# Patient Record
Sex: Male | Born: 1945 | Race: White | Hispanic: No | Marital: Married | Smoking: Never smoker
Health system: Southern US, Community
[De-identification: ages and names within clinical notes are randomized; demographics above are authoritative.]

## PROBLEM LIST (undated history)

## (undated) DIAGNOSIS — R51 Headache: Secondary | ICD-10-CM

## (undated) DIAGNOSIS — J189 Pneumonia, unspecified organism: Secondary | ICD-10-CM

## (undated) DIAGNOSIS — F419 Anxiety disorder, unspecified: Secondary | ICD-10-CM

## (undated) DIAGNOSIS — C61 Malignant neoplasm of prostate: Secondary | ICD-10-CM

## (undated) DIAGNOSIS — E785 Hyperlipidemia, unspecified: Secondary | ICD-10-CM

## (undated) DIAGNOSIS — E119 Type 2 diabetes mellitus without complications: Secondary | ICD-10-CM

## (undated) DIAGNOSIS — E11610 Type 2 diabetes mellitus with diabetic neuropathic arthropathy: Secondary | ICD-10-CM

## (undated) DIAGNOSIS — Z9289 Personal history of other medical treatment: Secondary | ICD-10-CM

## (undated) DIAGNOSIS — N400 Enlarged prostate without lower urinary tract symptoms: Secondary | ICD-10-CM

## (undated) DIAGNOSIS — G629 Polyneuropathy, unspecified: Secondary | ICD-10-CM

## (undated) DIAGNOSIS — I10 Essential (primary) hypertension: Secondary | ICD-10-CM

## (undated) HISTORY — DX: Benign prostatic hyperplasia without lower urinary tract symptoms: N40.0

## (undated) HISTORY — PX: TONSILLECTOMY: SUR1361

## (undated) HISTORY — DX: Type 2 diabetes mellitus with diabetic neuropathic arthropathy: E11.610

## (undated) HISTORY — DX: Malignant neoplasm of prostate: C61

## (undated) HISTORY — DX: Type 2 diabetes mellitus without complications: E11.9

## (undated) HISTORY — DX: Personal history of other medical treatment: Z92.89

## (undated) HISTORY — DX: Essential (primary) hypertension: I10

## (undated) HISTORY — PX: EYE SURGERY: SHX253

## (undated) HISTORY — PX: ESOPHAGOGASTRODUODENOSCOPY: SHX1529

## (undated) HISTORY — PX: OTHER SURGICAL HISTORY: SHX169

---

## 1997-05-07 DIAGNOSIS — Z9289 Personal history of other medical treatment: Secondary | ICD-10-CM

## 1997-05-07 HISTORY — DX: Personal history of other medical treatment: Z92.89

## 1997-11-29 ENCOUNTER — Ambulatory Visit (HOSPITAL_COMMUNITY): Admission: RE | Admit: 1997-11-29 | Discharge: 1997-11-29 | Payer: Self-pay | Admitting: Family Medicine

## 1998-01-12 ENCOUNTER — Ambulatory Visit (HOSPITAL_COMMUNITY): Admission: RE | Admit: 1998-01-12 | Discharge: 1998-01-12 | Payer: Self-pay | Admitting: Family Medicine

## 1998-01-12 ENCOUNTER — Encounter: Payer: Self-pay | Admitting: Family Medicine

## 1998-05-07 HISTORY — PX: OTHER SURGICAL HISTORY: SHX169

## 1999-05-03 ENCOUNTER — Other Ambulatory Visit: Admission: RE | Admit: 1999-05-03 | Discharge: 1999-05-03 | Payer: Self-pay | Admitting: Urology

## 1999-05-18 ENCOUNTER — Encounter: Admission: RE | Admit: 1999-05-18 | Discharge: 1999-08-16 | Payer: Self-pay | Admitting: Radiation Oncology

## 1999-07-11 ENCOUNTER — Encounter: Payer: Self-pay | Admitting: Urology

## 1999-07-11 ENCOUNTER — Ambulatory Visit (HOSPITAL_BASED_OUTPATIENT_CLINIC_OR_DEPARTMENT_OTHER): Admission: RE | Admit: 1999-07-11 | Discharge: 1999-07-11 | Payer: Self-pay | Admitting: Urology

## 1999-08-23 ENCOUNTER — Encounter: Admission: RE | Admit: 1999-08-23 | Discharge: 1999-11-21 | Payer: Self-pay | Admitting: Radiation Oncology

## 1999-11-15 ENCOUNTER — Encounter: Admission: RE | Admit: 1999-11-15 | Discharge: 2000-02-13 | Payer: Self-pay | Admitting: *Deleted

## 2000-02-20 ENCOUNTER — Encounter: Admission: RE | Admit: 2000-02-20 | Discharge: 2000-05-20 | Payer: Self-pay | Admitting: Orthopedic Surgery

## 2000-02-29 ENCOUNTER — Ambulatory Visit (HOSPITAL_BASED_OUTPATIENT_CLINIC_OR_DEPARTMENT_OTHER): Admission: RE | Admit: 2000-02-29 | Discharge: 2000-02-29 | Payer: Self-pay | Admitting: Orthopedic Surgery

## 2000-09-04 DIAGNOSIS — E11610 Type 2 diabetes mellitus with diabetic neuropathic arthropathy: Secondary | ICD-10-CM

## 2000-09-04 HISTORY — PX: FOOT SURGERY: SHX648

## 2000-09-04 HISTORY — DX: Type 2 diabetes mellitus with diabetic neuropathic arthropathy: E11.610

## 2001-07-31 ENCOUNTER — Inpatient Hospital Stay (HOSPITAL_COMMUNITY): Admission: AD | Admit: 2001-07-31 | Discharge: 2001-08-04 | Payer: Self-pay | Admitting: Orthopedic Surgery

## 2001-07-31 ENCOUNTER — Encounter: Payer: Self-pay | Admitting: Orthopedic Surgery

## 2001-12-15 ENCOUNTER — Inpatient Hospital Stay (HOSPITAL_COMMUNITY): Admission: AD | Admit: 2001-12-15 | Discharge: 2001-12-20 | Payer: Self-pay | Admitting: Orthopedic Surgery

## 2001-12-15 HISTORY — PX: OTHER SURGICAL HISTORY: SHX169

## 2001-12-22 ENCOUNTER — Encounter (HOSPITAL_BASED_OUTPATIENT_CLINIC_OR_DEPARTMENT_OTHER): Admission: RE | Admit: 2001-12-22 | Discharge: 2002-03-22 | Payer: Self-pay | Admitting: Orthopedic Surgery

## 2002-03-23 ENCOUNTER — Encounter (HOSPITAL_BASED_OUTPATIENT_CLINIC_OR_DEPARTMENT_OTHER): Admission: RE | Admit: 2002-03-23 | Discharge: 2002-06-21 | Payer: Self-pay | Admitting: Orthopedic Surgery

## 2002-12-06 HISTORY — PX: OTHER SURGICAL HISTORY: SHX169

## 2010-09-06 ENCOUNTER — Encounter: Payer: Self-pay | Admitting: Family Medicine

## 2010-09-22 NOTE — Op Note (Signed)
NAME:  Logan May, Logan May                       ACCOUNT NO.:  [id number]   MEDICAL RECORD NO.:  [id number]                   PATIENT TYPE:  INP   LOCATION:  5014                                 FACILITY:  [facility]   PHYSICIAN:  Nadara Mustard, M.D.                DATE OF BIRTH:  [birth year]   DATE OF PROCEDURE:  12/16/2001  DATE OF DISCHARGE:                                 OPERATIVE REPORT   PREOPERATIVE DIAGNOSES:  1. 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  2. New purulent abscess, lateral left foot with ischemic necrotic foot skin     laterally.   PROCEDURE:  1. Irrigation and debridement of plantar chronic Wagner grade 1 ulcer with     exostectomy and loose closure of the plantar wound.  2. Irrigation and debridement as well as exostectomy of the lateral purulent     abscess.  Cultures obtained x4.   SURGEON:  Nadara Mustard, M.D.   ANESTHESIA:  General.   ESTIMATED BLOOD LOSS:  Minimal.   TOURNIQUET TIME:  Esmarch to the ankle for approximately 15 minutes.   ANTIBIOTICS:  Zosyn preoperatively.  Cultures x2.   DISPOSITION:  PACU in stable condition.   INDICATIONS FOR PROCEDURE:  The patient is a 65 year old gentleman with a  chronic plantar Charcot ulcer who has had a stable course with this ulcer;  however, the patient has over the weekend developed a new lateral ischemic  ulcer and has had purulence, fever, chills and presents at this time for  surgical intervention.  The risks and benefits were discussed.  Patient  states he understands and wishes to proceed at this time.   DESCRIPTION OF PROCEDURE:  The patient was brought to the OR 15 and  underwent a general anesthetic.  After an adequate level of anesthesia was  obtained, the patient's left lower extremity was prepped using Duraprep and  draped as a sterile field.  Attention was first focused on the cleaner  plantar ulcer.  The ulcer was ellipsed out.  The exostosis was excised.  The  wound was irrigated with pulse lavage.  The wound had good viable skin edges  and there was no purulent drainage from the wound.  Attention was then  focused on the lateral wound.  The lateral abscess was incised.  Purulent  discharge was identified.  Cultures were obtained x4.  An exostectomy at the  base of the fifth metatarsal was also performed.  Necrotic tissue was  excised.  This wound was left open.  Again, this wound was also irrigated  with pulse lavage.  The plantar wound was covered with Adaptic.  The lateral  wound was covered with a moistened 4x4.  The wounds were then covered with  4x4s, Webril and a loosely wrapped Coban.  The patient  was extubated and taken to the PACU in stable condition.  Total tourniquet  time around  the ankle was Esmarch for approximately 15 minutes.  Plan for  repeat daily pulse lavage of the lateral wound.  Patient may eventually  require vac for wound closure.  Plan for discharge once stable from a wound  stand point.                                               Nadara Mustard, M.D.    MVD/MEDQ  D:  12/16/2001  T:  12/18/2001  Job:  [id number]

## 2010-09-22 NOTE — Discharge Summary (Signed)
[place]  Patient:    Logan May, Logan May Visit Number: [id number] MRN: [id number]          Service Type: MED Location: 5000 5013 01 Attending Physician:  Nadara Mustard Dictated by:   Nadara Mustard, M.D. Admit Date:  07/31/2001 Discharge Date: 08/04/2001                             Discharge Summary  DIAGNOSES: 1. Cellulitis with Charcot arthropathy and collapse of the left foot 2. Type II diabetes.  PROCEDURES: IV antibiotics and wound care.  DISCHARGE DISPOSITION: To home in stable condition.  FOLLOW-UP: In the office in one week.  HISTORY OF PRESENT ILLNESS: The patient is a 65 year old gentleman with type II diabetes with neuropathy, who has had an ulceration on the plantar aspect of his left foot secondary to Charcot collapse of the left foot. The patient presented to the office with acute ischemia of the right third toe and cellulitis of the mid foot. The patient was felt to require IV antibiotics and was admitted for IV antibiotics and wound care. The patients hospital course was essentially unremarkable. He was started on IV Zosyn and was started on wound care with Accuzyme dressing changes and debridement of the ulcer. The ulcer and cellulitis resolved. The patient still had persistent ulceration but the cellulitis had completely resolved. The patient was discharged to home in stable condition on p.o. Keflex 500 mg twice a day on August 03, 2001 in stable condition. He will resume using his Iodosorb with dressing changes at home daily after cleansing the wound. Will be nonweight bearing on the foot and follow-up in the office in one week. Dictated by:   Nadara Mustard, M.D. Attending Physician:  Nadara Mustard DD:  08/19/01 TD:  08/19/01 Job: [id number]

## 2010-09-22 NOTE — Discharge Summary (Signed)
   NAME:  Logan May, Logan May                       ACCOUNT NO.:  [id number]   MEDICAL RECORD NO.:  [id number]                   PATIENT TYPE:  INP   LOCATION:  5014                                 FACILITY:  [facility]   PHYSICIAN:  Nadara Mustard, M.D.                DATE OF BIRTH:  11-17-1945   DATE OF ADMISSION:  12/15/2001  DATE OF DISCHARGE:  12/20/2001                                 DISCHARGE SUMMARY   DIAGNOSIS:  Abscess, left foot.   PROCEDURE:  1. Exostectomy for prominent Charcot collapsed exostosis.  2. Irrigation and debridement of open ulcers x2.   DISPOSITION:  Discharged to home in stable condition.   FOLLOWUP:  Follow up in the office in one week.   HISTORY OF PRESENT ILLNESS:  The patient is a 65 year old gentleman, type 2  diabetic, with a chronic history of ulcer of his left foot secondary to  Charcot collapse.  The patient recently has had an acute new laceration over  the lateral aspect of his foot and a recurrent laceration over the plantar  aspect of his foot and presents at this time for treatment of the new and  chronic ulcer.   HOSPITAL COURSE:  The patient underwent surgical treatment on December 16, 2001 with exostectomy, irrigation and debridement of the ulcers x2.  His  tourniquet time was at the ankle for approximately 15 minutes.  He received  Zosyn preoperatively and cultures were obtained x2.  The patient's  postoperative course was essentially unremarkable.  He remained on Zosyn.  His temperature and CBG remained within normal ranges.  He was started on  hydrotherapy on postoperative day 1.  The patient progressed well.  The  wound care showed good healing.  He was discharged to home in stable  condition on December 20, 2001 with a prescription for p.o. antibiotics and  followup in the office next week.   WOUND CARE:  He was given instructions for wound care.                                               Nadara Mustard, M.D.    MVD/MEDQ  D:   01/13/2002  T:  01/14/2002  Job:  [id number]

## 2010-09-22 NOTE — Op Note (Signed)
[place]  Patient:    Logan May, Logan May                    MRN: [id number] Proc. Date: 02/29/00 Adm. Date:  [id number] Disc. Date: [id number] Attending:  Nadara Mustard                           Operative Report  PREOPERATIVE DIAGNOSIS: [address] grade 1 ulcer with exostosis, left mid foot.  POSTOPERATIVE DIAGNOSIS: Wagner grade 1 ulcer with exostosis, left mid foot.  OPERATION/PROCEDURE: Exostectomy, left mid foot.  SURGEON: Nadara Mustard, M.D.  ANESTHESIA: Ankle block.  ESTIMATED BLOOD LOSS: Minimal.  ANTIBIOTICS: Kefzol 1 g.  DRAINS: None.  COMPLICATIONS: None.  TOURNIQUET TIME: Esmarch at ankle for approximately 15 minutes.  DISPOSITION: Patient taken to the PACU in stable condition.  INDICATIONS FOR PROCEDURE: The patient is a 66 year old gentleman who has had a chronic mid foot plantar ulcer of his left mid foot and has undergone conservative care with casting and has finally healed the ulcer and presents at this time for exostectomy.  The risks and benefits were discussed including infection, neurovascular injury, persistent ulceration and need for additional surgery.  The patient states he understands and wishes to proceed at this time.  DESCRIPTION OF PROCEDURE: The patient was brought to operating room 6 outpatient after undergoing an ankle block.  After an adequate level of anesthesia was obtained the patients left lower extremity was prepped using DuraPrep and draped into a sterile field.  A longitudinal incision was made on the plantar aspect of the foot and carried down to the exostosis.  The tendons and vessels were protected and were moved out of the surgical field.  The exostosis was excised using an osteotome.  The final plantar aspect of his foot was level, with no bony prominence.  The wound was irrigated with normal saline and there was no evidence of infection.  The wound was closed using a vertical mattress  suture of 2-0 Nylon.  The wound was covered with Adaptic, orthopedic sponges, sterile Webril, and a Coban.  The patient was then taken to the PACU in stable condition, with plan to follow up in the office in one week, nonweightbearing on the left lower extremity. DD:  02/29/00 TD:  03/01/00 Job: [id number]

## 2010-09-22 NOTE — Op Note (Signed)
[place]  Patient:    Logan May, Logan May                MRN: [id number] Proc. Date: 07/11/99 Adm. Date:  [id number] Attending:  Evlyn Clines CC:         Monica Becton, M.D.             Wynn Banker, M.D.                           Operative Report  PREOPERATIVE DIAGNOSIS:  Prostate cancer.  POSTOPERATIVE DIAGNOSIS:  Prostate cancer.  PROCEDURE:  Radioactive I-125 prostate seed implantation and cystoscopy with foreign body removal.  SURGEON:  Excell Seltzer. Annabell Howells, M.D.  ANESTHESIA:  General.  DRAINS:  Foley.  COMPLICATIONS:  None.  INDICATIONS:  The patient is a 65 year old white male with prostate cancer.  He has elected seed implantation.  FINDINGS AND DESCRIPTION OF PROCEDURE:  The patient was taken to the operating oom where he received 400 mg of Cipro IV.  A general anesthetic was induced and he as placed in the lithotomy position.  His genitalia was prepped and Foley catheter was inserted.  The balloon was filled with 10 cc of _____ contrast.  The scrotum was then retracted cephalad with towel and Op-Site dressing.  The catheter had been  placed to straight drainage.  The perineum was shaved.  The red rubber catheter was placed rectally ________.  The ultrasound probe was assembled and inserted after being obscured in the stabilization base.  Once in position, the ultrasound probe was adjusted until it approximated the preoperative treatment plan.  At that point, it was secured.  The patients perineum was then prepped with Betadine solution.  He was draped with sterile towels.  The needle guide template was placed. Anchors were then placed under ultrasonic and fluoroscopic guidance at C3.5 and E3.5. nce the anchors were in position, the treatment was performed according to the predetermined plan.  There were some minor adjustments made in the periurethral  area where two needles were moved laterally,  0.5 cm on each side.  A total of 140 seeds were placed using 28 needle.  The position of the seeds were checked frequently with fluoroscopy and the ultrasound.  Once all seeds had been placed, films were taken with the probe in and out.  With the probe removed, the Foley catheter was removed.  The genitalia was reprepped and cystoscopy was performed with a 16-French flexible scope.  Examination revealed a normal urethra and intact external sphincter.  The prostatic urethra had some lateral lobe enlargement without significant obstruction.  No evidence of injury to the urethra or prostatic urethra were identified.  The bladder had mild trabeculation. There was no bleeding.  Ureteral orifices are in their normal anatomic position. There was one strand of seeds that were protruding approximately 2-3 cm into the bladder at the left bladder neck.  It was felt these needed to be removed.  The seed strand was grasped with a grasping forceps and removed without difficulty. A fresh Foley catheter was then inserted and balloon was filled with 10 cc of sterile fluid, and the catheter was placed to straight drainage.  The patients perineum was cleaned and dressed.  The patient was taken down from the lithotomy position, his anesthetic and reversed, and he was moved to the recovery room in stable condition.  There were no complications. DD:  07/11/99  TD:  07/12/99 Job: [id number]

## 2010-09-22 NOTE — H&P (Signed)
[facility]  Patient:    Logan May, Logan May                    MRN: [id number] Adm. Date:  [id number] Attending:  Kandis Mannan                         History and Physical  CHIEF COMPLAINT:  Blister of left foot.  HISTORY OF PRESENT ILLNESS:  This 65 year old white male noted a blister on his  left foot about two days ago.  This was on the plantar surface, and he was seen by Dr. Estell Harpin, and started on Cipro 750 mg b.i.d.  The blister was covered with Duoderm.  The patient has now developed some increased temperature with redness in his left foot.  The patient had x-rays of his foot today, which have not been interpreted, but are consistent as far as I can tell with Charcot-like changes.  PAST MEDICAL HISTORY:  The patient is a non-insulin-dependent diabetic.  He has had cancer of the prostate.  CURRENT MEDICATIONS: 1. Accupril 40 mg q.d. 2. Ultram 50 mg q. night. 3. ______ 2 mg q.d. 4. Cipro 750 mg b.i.d. 5. ______/HCT 160 mg q.d.  PHYSICAL EXAMINATION:  EXTREMITIES:  Reveals a deformed left foot with extra bony prominences of the plantar surface of the left foot.   Temperature of the plantar surface on the left side is 93.6 degrees, and on the right side is 85.9 degrees.  A blister was removed, and before the blister was removed, it was 55.0 mm x 50.0 mm, and after debriding the wound, he had an ulcer area 27.0 mm x 28.0 mm.  IMPRESSION:  Charcots joint, left foot.  DISPOSITION:  The patient was debrided today, and he will continue his Cipro. e will be placed in a Mabel cast and will be seen back in the clinic in six days, for a followup. DD:  11/15/99 TD:  11/15/99 Job: [id number]

## 2010-09-22 NOTE — H&P (Signed)
NAME:  Logan May, WALSH                       ACCOUNT NO.:  [id number]   MEDICAL RECORD NO.:  [id number]                   PATIENT TYPE:  INP   LOCATION:  5014                                 FACILITY:  [facility]   PHYSICIAN:  Nadara Mustard, M.D.                DATE OF BIRTH:  1946-02-21   DATE OF ADMISSION:  12/15/2001  DATE OF DISCHARGE:                                HISTORY & PHYSICAL   HISTORY OF PRESENT ILLNESS:  The patient is a 65 year old gentleman, type 2  diabetic, with a chronic plantar ulcer, left mid  foot, secondary to Charcot  collapse. The patient has been treated conservatively with casting as well  as exostectomy for this chronic ulcer and has episodes where the ulcer has  healed and then recurred. The patient presents at this time with a new  lateral ulcer over the base of the  fifth metatarsal which he states  happened over the weekend. He complains of fever, chills, drainage from the  lateral wound.   ALLERGIES:  No known drug allergies.   MEDICATIONS:  1. Accupril.  2. Valium.  3. Keflex.  4. Diovan.  5. Amaryl.   PAST SURGICAL HISTORY:  1. Seed implant 2001.  2. Ostectomy of  left foot 2001.   FAMILY HISTORY:  Positive for diabetes and hypertension.   SOCIAL HISTORY:  Negative for tobacco, positive for alcohol. He is married.   REVIEW OF SYMPTOMS:  Positive for prostate cancer, type 2 diabetes,  hypertension and does have a history of mononucleosis.   PHYSICAL EXAMINATION:  VITAL SIGNS:  Temperature 100.5, pulse 78,  respiratory rate 16, blood pressure 132/78.  GENERAL:  No acute distress.  LUNGS:  Clear to auscultation.  CARDIOVASCULAR:  Regular rate and rhythm.  NECK:  Supple, no bruits.  EXTREMITIES:  Examination of the left foot, he has a swollen, erythematous  foot with the erythema laterally over the necrotic lateral ulcer over the  base of the  fifth metatarsal. He also has some drainage  coming from his  chronic plantar ulcer, although  there is no cellulitis  or necrosis around  the chronic plantar ulcer.   ASSESSMENT:  1. New abscess, lateral  left foot.  2. Chronic  Charcot collapse with plantar Wagner grade 1 ulcer.   PLAN:  We will go ahead and admit him and place  him on IV antibiotics and  plan for surgery in the morning. Plan for irrigation and debridement and  exostectomy as well as debridement of purulent abscess. The risks and  benefits were discussed, including infection, neurovascular injury,  persistent infection, need for additional surgery, need for high level  amputation. The patient states he understands and wishes to proceed at this  time.  Nadara Mustard, M.D.    MVD/MEDQ  D:  12/16/2001  T:  12/19/2001  Job:  [id number]

## 2010-09-22 NOTE — H&P (Signed)
[place]. [facility]  Patient:    Logan May, Logan May Visit Number: [id number] MRN: [id number]          Service Type: MED Location: 5000 5013 01 Attending Physician:  Nadara Mustard Dictated by:   Nadara Mustard, M.D. Admit Date:  07/31/2001                           History and Physical  HISTORY OF PRESENT ILLNESS:  Patient is a 65 year old gentleman with type 2 diabetes who has been followed for ulceration with a Charcot collapse of his left midfoot, who presents at this time with an acute onset of ischemia of the right third toe and cellulitis of the midfoot.  PAST SURGICAL HISTORY:  Seed implant for prostate CA.  ALLERGIES:  No known drug allergies.  PRIMARY CARE PHYSICIAN:  Dr. Monica Becton in [place], [place].  SOCIAL HISTORY:  Negative for tobacco.  He works in [employer].  REVIEW OF SYSTEMS:  Positive for prostate CA, type 2 diabetes and hypertension.  PHYSICAL EXAMINATION:  VITAL SIGNS:  Temperature 98.8, heart rate 88, respiratory rate 16, blood pressure 128/84.  GENERAL:  He is in no acute distress.  LUNGS:  Clear to auscultation.  CARDIOVASCULAR:  Regular rate and rhythm.  NECK:  Supple with no bruits.  EXTREMITIES:  On examination of the right lower extremity, he has ischemic and necrotic right third toe with cellulitis to the midfoot.  He has good dorsalis pedis pulse.  Left foot:  He has a Charcot collapse with recurrent ulceration.  LABORATORY AND ACCESSORY DATA:  Radiographs of the right foot show no osteomyelitis of the right third toe.   ASSESSMENT: 1. Cellulitis and ischemic changes to the right third toe. 2. Charcot collapse of left foot with ulceration of midfoot ulcer.  PLAN:  We will admit the patient, place him on IV Zosyn, start wound care with Accuzyme dressing changes daily, apply a felt donut to the left midfoot.  Plan to discharge once the cellulitis has resolved; discussed the possibility of  a third toe amputation if this does not resolve. Dictated by:   Nadara Mustard, M.D. Attending Physician:  Nadara Mustard DD:  08/01/01 TD:  08/01/01 Job: [id number]

## 2011-11-16 ENCOUNTER — Institutional Professional Consult (permissible substitution): Payer: Self-pay | Admitting: Cardiology

## 2011-11-30 ENCOUNTER — Encounter: Payer: Self-pay | Admitting: Cardiology

## 2012-09-03 ENCOUNTER — Encounter (HOSPITAL_COMMUNITY): Payer: Self-pay | Admitting: Pharmacy Technician

## 2012-09-03 ENCOUNTER — Other Ambulatory Visit (HOSPITAL_COMMUNITY): Payer: Self-pay | Admitting: Orthopedic Surgery

## 2012-09-04 ENCOUNTER — Encounter (HOSPITAL_COMMUNITY): Payer: Self-pay

## 2012-09-04 ENCOUNTER — Ambulatory Visit (HOSPITAL_COMMUNITY)
Admission: RE | Admit: 2012-09-04 | Discharge: 2012-09-04 | Disposition: A | Discharge status: Home / Self Care | Payer: Medicare Other | Source: Ambulatory Visit | Attending: Orthopedic Surgery | Admitting: Orthopedic Surgery

## 2012-09-04 ENCOUNTER — Encounter (HOSPITAL_COMMUNITY)
Admission: RE | Admit: 2012-09-04 | Discharge: 2012-09-04 | Disposition: A | Discharge status: Home / Self Care | Payer: Medicare Other | Source: Ambulatory Visit | Attending: Orthopedic Surgery | Admitting: Orthopedic Surgery

## 2012-09-04 HISTORY — DX: Anxiety disorder, unspecified: F41.9

## 2012-09-04 LAB — PROTIME-INR
INR: 1.04 (ref 0.00–1.49)
Prothrombin Time: 13.5 seconds (ref 11.6–15.2)

## 2012-09-04 LAB — COMPREHENSIVE METABOLIC PANEL
Albumin: 3.8 g/dL (ref 3.5–5.2)
BUN: 13 mg/dL (ref 6–23)
Creatinine, Ser: 0.98 mg/dL (ref 0.50–1.35)
GFR calc Af Amer: >90 mL/min (ref >90)
Glucose, Bld: 170 mg/dL — ABNORMAL HIGH (ref 70–99)
Total Protein: 7.8 g/dL (ref 6.0–8.3)

## 2012-09-04 LAB — CBC
HCT: 41.9 % (ref 39.0–52.0)
Hemoglobin: 14.5 g/dL (ref 13.0–17.0)
MCH: 31 pg (ref 26.0–34.0)
MCHC: 34.6 g/dL (ref 30.0–36.0)
MCV: 89.5 fL (ref 78.0–100.0)
RDW: 13.1 % (ref 11.5–15.5)

## 2012-09-04 MED ORDER — CEFAZOLIN SODIUM-DEXTROSE 2-3 GM-% IV SOLR
2.0000 g | INTRAVENOUS | Status: AC
  Administered 2012-09-05: 2 g via INTRAVENOUS
  Filled 2012-09-04: qty 50

## 2012-09-04 NOTE — Progress Notes (Signed)
PCP is Dr. Christell Constant at [facility]. Patient denied having a stress test, cardiac cath, or sleep study.

## 2012-09-04 NOTE — Pre-Procedure Instructions (Signed)
[facility]  09/04/2012   Your procedure is scheduled on:  Friday Sep 05, 2012.  Report to [facility] 3[facility] at 12:50 PM.  Call this number if you have problems the morning of surgery: [phone]   Remember:   Do not eat food or drink liquids after midnight.   Take these medicines the morning of surgery with A SIP OF WATER: Diazepam (Valium) if needed for anxiety, Doxycycline (Doryx), Hydrocodone if needed for pain, and Nifedipine (Procardia XL)   Do not wear jewelry  Do not wear lotions or colognes.  Men may shave face and neck.  Do not bring valuables to the hospital.  Contacts, dentures or bridgework may not be worn into surgery.  Leave suitcase in the car. After surgery it may be brought to your room.  For patients admitted to the hospital, checkout time is 11:00 AM the day of discharge.   Patients discharged the day of surgery will not be allowed to drive home.  Name and phone number of your driver: Family/Friend  Special Instructions: Shower using CHG 2 nights before surgery and the night before surgery.  If you shower the day of surgery use CHG.  Use special wash - you have one bottle of CHG for all showers.  You should use approximately 1/3 of the bottle for each shower.   Please read over the following fact sheets that you were given: Pain Booklet, Coughing and Deep Breathing, MRSA Information and Surgical Site Infection Prevention

## 2012-09-05 ENCOUNTER — Encounter (HOSPITAL_COMMUNITY): Payer: Self-pay | Admitting: *Deleted

## 2012-09-05 ENCOUNTER — Ambulatory Visit (HOSPITAL_COMMUNITY): Payer: Medicare Other | Admitting: Vascular Surgery

## 2012-09-05 ENCOUNTER — Encounter (HOSPITAL_COMMUNITY)
Admission: RE | Disposition: A | Discharge status: Home / Self Care | Payer: Self-pay | Source: Ambulatory Visit | Attending: Orthopedic Surgery

## 2012-09-05 ENCOUNTER — Encounter (HOSPITAL_COMMUNITY): Payer: Self-pay | Admitting: Vascular Surgery

## 2012-09-05 ENCOUNTER — Ambulatory Visit (HOSPITAL_COMMUNITY)
Admission: RE | Admit: 2012-09-05 | Discharge: 2012-09-05 | Disposition: A | Discharge status: Home / Self Care | Payer: Medicare Other | Source: Ambulatory Visit | Attending: Orthopedic Surgery | Admitting: Orthopedic Surgery

## 2012-09-05 DIAGNOSIS — E1142 Type 2 diabetes mellitus with diabetic polyneuropathy: Secondary | ICD-10-CM | POA: Insufficient documentation

## 2012-09-05 DIAGNOSIS — L97509 Non-pressure chronic ulcer of other part of unspecified foot with unspecified severity: Secondary | ICD-10-CM | POA: Insufficient documentation

## 2012-09-05 DIAGNOSIS — M869 Osteomyelitis, unspecified: Secondary | ICD-10-CM | POA: Insufficient documentation

## 2012-09-05 DIAGNOSIS — E1169 Type 2 diabetes mellitus with other specified complication: Secondary | ICD-10-CM | POA: Insufficient documentation

## 2012-09-05 DIAGNOSIS — M9080 Osteopathy in diseases classified elsewhere, unspecified site: Secondary | ICD-10-CM | POA: Insufficient documentation

## 2012-09-05 DIAGNOSIS — M86172 Other acute osteomyelitis, left ankle and foot: Secondary | ICD-10-CM

## 2012-09-05 DIAGNOSIS — F411 Generalized anxiety disorder: Secondary | ICD-10-CM | POA: Insufficient documentation

## 2012-09-05 DIAGNOSIS — C61 Malignant neoplasm of prostate: Secondary | ICD-10-CM | POA: Insufficient documentation

## 2012-09-05 DIAGNOSIS — I10 Essential (primary) hypertension: Secondary | ICD-10-CM | POA: Insufficient documentation

## 2012-09-05 DIAGNOSIS — E559 Vitamin D deficiency, unspecified: Secondary | ICD-10-CM | POA: Insufficient documentation

## 2012-09-05 DIAGNOSIS — E1149 Type 2 diabetes mellitus with other diabetic neurological complication: Secondary | ICD-10-CM | POA: Insufficient documentation

## 2012-09-05 HISTORY — PX: I & D EXTREMITY: SHX5045

## 2012-09-05 SURGERY — IRRIGATION AND DEBRIDEMENT EXTREMITY
Anesthesia: Regional | Site: Foot | Laterality: Left

## 2012-09-05 MED ORDER — OXYCODONE-ACETAMINOPHEN 5-325 MG PO TABS: 1.0000 | ORAL_TABLET | ORAL | Status: DC | PRN

## 2012-09-05 MED ORDER — VANCOMYCIN HCL 500 MG IV SOLR
INTRAVENOUS | Status: AC
  Filled 2012-09-05: qty 500

## 2012-09-05 MED ORDER — LIDOCAINE HCL (CARDIAC) 20 MG/ML IV SOLN
INTRAVENOUS | Status: DC | PRN
  Administered 2012-09-05: 60 mg via INTRAVENOUS

## 2012-09-05 MED ORDER — GENTAMICIN SULFATE 40 MG/ML IJ SOLN
INTRAMUSCULAR | Status: AC
  Filled 2012-09-05: qty 4

## 2012-09-05 MED ORDER — PROPOFOL INFUSION 10 MG/ML OPTIME
INTRAVENOUS | Status: DC | PRN
  Administered 2012-09-05: 30 ug/kg/min via INTRAVENOUS

## 2012-09-05 MED ORDER — FENTANYL CITRATE 0.05 MG/ML IJ SOLN
INTRAMUSCULAR | Status: DC | PRN
  Administered 2012-09-05 (×2): 50 ug via INTRAVENOUS

## 2012-09-05 MED ORDER — VANCOMYCIN HCL 500 MG IV SOLR
INTRAVENOUS | Status: DC | PRN
  Administered 2012-09-05: 500 mg

## 2012-09-05 MED ORDER — ONDANSETRON HCL 4 MG/2ML IJ SOLN
INTRAMUSCULAR | Status: DC | PRN
  Administered 2012-09-05: 4 mg via INTRAVENOUS

## 2012-09-05 MED ORDER — MIDAZOLAM HCL 5 MG/5ML IJ SOLN
INTRAMUSCULAR | Status: DC | PRN
  Administered 2012-09-05: 2 mg via INTRAVENOUS

## 2012-09-05 MED ORDER — HYDROMORPHONE HCL PF 1 MG/ML IJ SOLN: 0.2500 mg | INTRAMUSCULAR | Status: DC | PRN

## 2012-09-05 MED ORDER — ONDANSETRON HCL 4 MG/2ML IJ SOLN: 4.0000 mg | Freq: Once | INTRAMUSCULAR | Status: DC | PRN

## 2012-09-05 MED ORDER — PROPOFOL 10 MG/ML IV BOLUS
INTRAVENOUS | Status: DC | PRN
  Administered 2012-09-05 (×2): 40 mg via INTRAVENOUS

## 2012-09-05 MED ORDER — LACTATED RINGERS IV SOLN
INTRAVENOUS | Status: DC
  Administered 2012-09-05: 15:00:00 via INTRAVENOUS

## 2012-09-05 MED ORDER — SODIUM CHLORIDE 0.9 % IR SOLN
Status: DC | PRN
  Administered 2012-09-05: 3000 mL

## 2012-09-05 MED ORDER — LACTATED RINGERS IV SOLN
INTRAVENOUS | Status: DC | PRN
  Administered 2012-09-05 (×2): via INTRAVENOUS

## 2012-09-05 MED ORDER — GENTAMICIN SULFATE 40 MG/ML IJ SOLN
INTRAMUSCULAR | Status: DC | PRN
  Administered 2012-09-05: 120 mg via INTRAMUSCULAR

## 2012-09-05 SURGICAL SUPPLY — 46 items
BANDAGE GAUZE ELAST BULKY 4 IN (GAUZE/BANDAGES/DRESSINGS) ×1 IMPLANT
BLADE SURG 10 STRL SS (BLADE) ×2 IMPLANT
BNDG COHESIVE 4X5 TAN STRL (GAUZE/BANDAGES/DRESSINGS) ×2 IMPLANT
BNDG COHESIVE 6X5 TAN STRL LF (GAUZE/BANDAGES/DRESSINGS) ×3 IMPLANT
BNDG GAUZE STRTCH 6 (GAUZE/BANDAGES/DRESSINGS) ×6 IMPLANT
CLOTH BEACON ORANGE TIMEOUT ST (SAFETY) ×2 IMPLANT
COTTON STERILE ROLL (GAUZE/BANDAGES/DRESSINGS) ×2 IMPLANT
COVER SURGICAL LIGHT HANDLE (MISCELLANEOUS) ×2 IMPLANT
CUFF TOURNIQUET SINGLE 18IN (TOURNIQUET CUFF) ×2 IMPLANT
CUFF TOURNIQUET SINGLE 24IN (TOURNIQUET CUFF) IMPLANT
CUFF TOURNIQUET SINGLE 34IN LL (TOURNIQUET CUFF) IMPLANT
CUFF TOURNIQUET SINGLE 44IN (TOURNIQUET CUFF) IMPLANT
DRAPE U-SHAPE 47X51 STRL (DRAPES) ×2 IMPLANT
DRSG ADAPTIC 3X8 NADH LF (GAUZE/BANDAGES/DRESSINGS) ×2 IMPLANT
DRSG PAD ABDOMINAL 8X10 ST (GAUZE/BANDAGES/DRESSINGS) ×1 IMPLANT
DURAPREP 26ML APPLICATOR (WOUND CARE) ×2 IMPLANT
ELECT CAUTERY BLADE 6.4 (BLADE) IMPLANT
ELECT REM PT RETURN 9FT ADLT (ELECTROSURGICAL)
ELECTRODE REM PT RTRN 9FT ADLT (ELECTROSURGICAL) IMPLANT
GLOVE BIOGEL PI IND STRL 9 (GLOVE) ×1 IMPLANT
GLOVE BIOGEL PI INDICATOR 9 (GLOVE) ×1
GLOVE SURG ORTHO 9.0 STRL STRW (GLOVE) ×2 IMPLANT
GOWN PREVENTION PLUS XLARGE (GOWN DISPOSABLE) ×2 IMPLANT
GOWN SRG XL XLNG 56XLVL 4 (GOWN DISPOSABLE) ×1 IMPLANT
GOWN STRL NON-REIN XL XLG LVL4 (GOWN DISPOSABLE) ×2
HANDPIECE INTERPULSE COAX TIP (DISPOSABLE)
KIT BASIN OR (CUSTOM PROCEDURE TRAY) ×2 IMPLANT
KIT ROOM TURNOVER OR (KITS) ×2 IMPLANT
KIT STIMULAN RAPID CURE 5CC (Orthopedic Implant) ×1 IMPLANT
MANIFOLD NEPTUNE II (INSTRUMENTS) ×2 IMPLANT
NS IRRIG 1000ML POUR BTL (IV SOLUTION) ×2 IMPLANT
PACK ORTHO EXTREMITY (CUSTOM PROCEDURE TRAY) ×2 IMPLANT
PAD ARMBOARD 7.5X6 YLW CONV (MISCELLANEOUS) ×4 IMPLANT
PADDING CAST COTTON 6X4 STRL (CAST SUPPLIES) ×2 IMPLANT
SET HNDPC FAN SPRY TIP SCT (DISPOSABLE) IMPLANT
SPONGE GAUZE 4X4 12PLY (GAUZE/BANDAGES/DRESSINGS) ×2 IMPLANT
SPONGE LAP 18X18 X RAY DECT (DISPOSABLE) ×2 IMPLANT
STOCKINETTE IMPERVIOUS 9X36 MD (GAUZE/BANDAGES/DRESSINGS) ×2 IMPLANT
SUT ETHILON 4 0 PS 2 18 (SUTURE) ×1 IMPLANT
TOWEL OR 17X24 6PK STRL BLUE (TOWEL DISPOSABLE) ×2 IMPLANT
TOWEL OR 17X26 10 PK STRL BLUE (TOWEL DISPOSABLE) ×2 IMPLANT
TUBE ANAEROBIC SPECIMEN COL (MISCELLANEOUS) IMPLANT
TUBE CONNECTING 12X1/4 (SUCTIONS) ×2 IMPLANT
UNDERPAD 30X30 INCONTINENT (UNDERPADS AND DIAPERS) ×2 IMPLANT
WATER STERILE IRR 1000ML POUR (IV SOLUTION) ×2 IMPLANT
YANKAUER SUCT BULB TIP NO VENT (SUCTIONS) ×2 IMPLANT

## 2012-09-05 NOTE — Discharge Summary (Signed)
Discharge to home °

## 2012-09-05 NOTE — H&P (Signed)
Logan May is an 67 y.o. male.   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 Patient has recurrent ulceration beneath the Charcot collapse rocker-bottom deformity. Patient has undergone prolonged conservative therapy and despite conservative treatment patient has progressive ulceration and exposed bone.  Past Medical History  Diagnosis Date  . Diabetes mellitus, type 2   . Hypertension   . BPH (benign prostatic hypertrophy)   . Prostate cancer     implants   . 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 Vitamin D deficiency   . History of ETT 1999    Dr, Axel Filler  . Anxiety     Past Surgical History  Procedure Laterality Date  . Foot surgery  May 2002    Dr. Lajoyce Corners   . Left foot casted 4-5 months    . Bilateral  eye laser surgery  2000  . Charcot foot left  12/15/01  . Laser eye surgery right  12/2002  . Eye surgery    . Tonsillectomy      No family history on file. Social History:  reports that he has never smoked. He does not have any smokeless tobacco history on file. He reports that he drinks about 3.6 ounces of alcohol per week. He reports that he does not use illicit drugs.  Allergies: No Known Allergies  No prescriptions prior to admission    Results for orders placed during the hospital encounter of 09/04/12 (from the past 48 hour(s))  APTT     Status: None   Collection Time    09/04/12  2:47 PM      Result Value Range   aPTT 33  24 - 37 seconds  CBC     Status: None   Collection Time    09/04/12  2:47 PM      Result Value Range   WBC 8.7  4.0 - 10.5 K/uL   RBC 4.68  4.22 - 5.81 MIL/uL   Hemoglobin 14.5  13.0 - 17.0 g/dL   HCT 16.1  09.6 - 04.5 %   MCV 89.5  78.0 - 100.0 fL   MCH 31.0  26.0 - 34.0 pg   MCHC 34.6  30.0 - 36.0 g/dL   RDW 40.9  81.1 - 91.4 %   Platelets 217  150 - 400 K/uL  COMPREHENSIVE METABOLIC PANEL     Status: Abnormal   Collection Time    09/04/12  2:47 PM      Result Value Range   Sodium 140  135 - 145 mEq/L   Potassium 4.2  3.5 - 5.1 mEq/L   Chloride 101  96 - 112 mEq/L   CO2 30  19 - 32 mEq/L   Glucose, Bld 170 (*) 70 - 99 mg/dL   BUN 13  6 - 23 mg/dL   Creatinine, Ser 7.82  0.50 - 1.35 mg/dL   Calcium 9.7  8.4 - 95.6 mg/dL   Total Protein 7.8  6.0 - 8.3 g/dL   Albumin 3.8  3.5 - 5.2 g/dL   AST 19  0 - 37 U/L   ALT 16  0 - 53 U/L   Alkaline Phosphatase 98  39 - 117 U/L   Total Bilirubin 0.5  0.3 - 1.2 mg/dL   GFR calc non Af Amer 83 (*) >90 mL/min   GFR calc Af Amer >90  >90 mL/min   Comment:  The eGFR has been calculated     using the CKD EPI equation.     This calculation has not been     validated in all clinical     situations.     eGFR's persistently     <90 mL/min signify     possible Chronic Kidney Disease.  PROTIME-INR     Status: None   Collection Time    09/04/12  2:47 PM      Result Value Range   Prothrombin Time 13.5  11.6 - 15.2 seconds   INR 1.04  0.00 - 1.49  SURGICAL PCR SCREEN     Status: None   Collection Time    09/04/12  2:48 PM      Result Value Range   MRSA, PCR NEGATIVE  NEGATIVE   Staphylococcus aureus NEGATIVE  NEGATIVE   Comment:            The Xpert SA Assay (FDA     approved for NASAL specimens     in patients over 55 years of age),     is one component of     a comprehensive surveillance     program.  Test performance has     been validated by [employer] for patients greater     than or equal to 52 year old.     It is not intended     to diagnose infection nor to     guide or monitor treatment.   Dg Chest 2 View  09/04/2012  *RADIOLOGY REPORT*  Clinical Data: Preoperative evaluation.  Treated hypertension. Previous right-sided rib injury.  CHEST - 2 VIEW  Comparison: None.  Findings: Cardiac silhouette is upper range of normal size.  There is slight aortic ectasia.  No hilar enlargement is evident.  No pulmonary infiltrates or  masses are seen. No pleural abnormality is evident. Bones appear average for age.  IMPRESSION: No acute or active cardiopulmonary pleural abnormalities are identified.   Original Report Authenticated By: Onalee Hua Call     Review of Systems  All other systems reviewed and are negative.    There were no vitals taken for this visit. Physical Exam  On examination patient has palpable pulses. 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  Plan: Will plan for excision of the bony prominence placement of antibiotic beads local tissue rearrangement and wound closure. Risks and benefits were discussed including infection neurovascular injury nonhealing of the wound need for additional surgery. Patient states he understands and wished to proceed at this time.  Krystalle Pilkington V 09/05/2012, 6:27 AM

## 2012-09-05 NOTE — Anesthesia Procedure Notes (Signed)
Anesthesia Regional Block:  Ankle block  Pre-Anesthetic Checklist: ,, timeout performed, Correct Patient, Correct Site, Correct Laterality, Correct Procedure, Correct Position, site marked, Risks and benefits discussed,  Surgical consent,  Pre-op evaluation,  At surgeon's request and post-op pain management  Laterality: Left  Prep: chloraprep and alcohol swabs       Needles:  Injection technique: Single-shot      Needle Gauge: 25 and 25 G    Additional Needles: Ankle block Narrative:  Start time: 09/05/2012 3:05 PM End time: 09/05/2012 3:10 PM Injection made incrementally with aspirations every 5 mL.  Additional Notes: Pt accepts procedure and risks. 40 cc ( 20cc 0.5% Naropin and 20cc 2% Lidocaine ) w/o dfficulty. Ant / Post / Tibial and Sural Nerve.  GES  Ankle block

## 2012-09-05 NOTE — Anesthesia Postprocedure Evaluation (Signed)
  Anesthesia Post-op Note  Patient: Logan May  Procedure(s) Performed: Procedure(s) with comments: IRRIGATION AND DEBRIDEMENT EXTREMITY (Left) - Excision Charcot Collapse Left Foot and Base 5th Metatarsal, Antibiotic Beads  Patient Location: PACU  Anesthesia Type:Regional  Level of Consciousness: awake, alert , oriented and patient cooperative  Airway and Oxygen Therapy: Patient Spontanous Breathing  Post-op Pain: none  Post-op Assessment: Post-op Vital signs reviewed, Patient's Cardiovascular Status Stable, Respiratory Function Stable, Patent Airway, No signs of Nausea or vomiting and Pain level controlled  Post-op Vital Signs: stable  Complications: No apparent anesthesia complications

## 2012-09-05 NOTE — Op Note (Signed)
OPERATIVE REPORT  DATE OF SURGERY: 09/05/2012  PATIENT:  Logan May,  67 y.o. male  PRE-OPERATIVE DIAGNOSIS:  Osteomyelitis and Ulcer Left Foot  POST-OPERATIVE DIAGNOSIS:  Osteomyelitis and Ulcer Left Foot  PROCEDURE:  Procedure(s): Partial excision navicular and midfoot. Excision of ulcer skin soft tissue muscle bone and tendon. Local tissue rearrangement to close a wound 7 cm x 3 cm. Placement of antibiotic beads 5 cc of stimulant beads with 500 mg vancomycin and 160 mg gentamicin  SURGEON:  Surgeon(s): Nadara Mustard, MD  ANESTHESIA:  Ankle block  EBL:  Minimal ML  SPECIMEN:  No Specimen  TOURNIQUET:  * No tourniquets in log *  PROCEDURE DETAILS: Patient is a 67 year old gentleman diabetic insensate neuropathy rocker-bottom deformity with ulceration osteomyelitis of the midfoot. Patient presents at this time for surgical intervention. Risks and benefits were discussed including infection neurovascular injury nonhealing of the wound need for additional surgery. Patient states he understands and wished to proceed at this time. Description of procedure patient was brought to the operating room after undergoing an ankle block. After adequate levels of anesthesia were obtained patient's left lower extremity was prepped using DuraPrep and draped into a sterile field. A large incision was made plantarly to ellipse out the ulcer which went down to bone. Using osteotomes the bone from the midfoot which includes the navicular and midfoot was excised sharply with the osteotome. Tendon muscle and soft tissue were also excised. Pulsatile lavage was used to cleanse the wound. Antibiotic needs were placed with 500 mg vancomycin and 160 mg gentamicin. Local tissue rearrangement was used to close the wound with 2-0 nylon. The wound was covered with Adaptic orthopedic sponges AB dressing Kerlix and Coban. Patient  was then taken to the PACU in stable condition.  PLAN OF CARE: Discharge to home  after PACU  PATIENT DISPOSITION:  PACU - hemodynamically stable.   Nadara Mustard, MD 09/05/2012 4:21 PM

## 2012-09-05 NOTE — Preoperative (Signed)
Beta Blockers   Reason not to administer Beta Blockers:Not Applicable 

## 2012-09-05 NOTE — Progress Notes (Signed)
Orthopedic Tech Progress Note Patient Details:  Logan May 11-13-1945 [id number] Post Op shoe applied to Left LE. Application tolerated well.  Ortho Devices Type of Ortho Device: Postop shoe/boot Ortho Device/Splint Location: Left Ortho Device/Splint Interventions: Application   Asia R Thompson 09/05/2012, 4:53 PM

## 2012-09-05 NOTE — Progress Notes (Signed)
Anesthesia Note:  Patient is a 67 year old male scheduled for excision of charcot collapse, left foot and base 5th metatarsal, antibiotic beads this afternoon.  He has a non-healing foot wound that has progressed over the past several days.  He is a diabetic.  Preoperative EKG showed afib @ 89 bpm, nonspecific ST abnormality.  We received most recent office notes from patient's PCP Dr. Rudi Heap that showed afib on an EKG from 2013.  Patient believes he was diagnosed with afib in 2013. He is yet to have an cardiac work-up.  He denies chest pain, SOB, edema, palpitations, syncope. Exam shows heart rate with periods or regularity followed by irregularity.  Lungs are clear.  There is no significant ankle edema.  His left foot sole has a gauze dressing with purulent drainage.  There is some surrounding erythema. The anesthesiologist assignment to this case was still pending at the time of my evaluation, but I did update Dr. Michelle Piper who stated patient could be further evaluated by his anesthesiologist once in [place].    Velna Ochs [facility] Short Stay Center/Anesthesiology Phone [phone] 09/05/2012 2:35 PM

## 2012-09-05 NOTE — Transfer of Care (Signed)
Immediate Anesthesia Transfer of Care Note  Patient: Logan May  Procedure(s) Performed: Procedure(s) with comments: IRRIGATION AND DEBRIDEMENT EXTREMITY (Left) - Excision Charcot Collapse Left Foot and Base 5th Metatarsal, Antibiotic Beads  Patient Location: PACU  Anesthesia Type:MAC  Level of Consciousness: awake, alert , oriented and patient cooperative  Airway & Oxygen Therapy: Patient Spontanous Breathing  Post-op Assessment: Report given to PACU RN, Post -op Vital signs reviewed and stable and Patient moving all extremities X 4  Post vital signs: Reviewed and stable  Complications: No apparent anesthesia complications

## 2012-09-05 NOTE — Anesthesia Preprocedure Evaluation (Signed)
Anesthesia Evaluation  Patient identified by MRN, date of birth, ID band Patient awake    Reviewed: Allergy & Precautions, H&P , NPO status , Patient's Chart, lab work & pertinent test results  Airway       Dental   Pulmonary          Cardiovascular hypertension, + Peripheral Vascular Disease     Neuro/Psych    GI/Hepatic   Endo/Other  diabetes, Type 2, Insulin Dependent  Renal/GU      Musculoskeletal   Abdominal   Peds  Hematology   Anesthesia Other Findings   Reproductive/Obstetrics                           Anesthesia Physical Anesthesia Plan  ASA: III  Anesthesia Plan: Regional   Post-op Pain Management:    Induction: Intravenous  Airway Management Planned: Simple Face Mask  Additional Equipment:   Intra-op Plan:   Post-operative Plan:   Informed Consent: I have reviewed the patients History and Physical, chart, labs and discussed the procedure including the risks, benefits and alternatives for the proposed anesthesia with the patient or authorized representative who has indicated his/her understanding and acceptance.     Plan Discussed with: CRNA, Anesthesiologist and Surgeon  Anesthesia Plan Comments:         Anesthesia Quick Evaluation

## 2012-09-08 ENCOUNTER — Encounter (HOSPITAL_COMMUNITY): Payer: Self-pay | Admitting: Orthopedic Surgery

## 2012-10-27 ENCOUNTER — Telehealth: Payer: Self-pay | Admitting: Family Medicine

## 2012-10-28 NOTE — Telephone Encounter (Signed)
Patient needed appt changed. Changed from 7-16 to 7-23.

## 2012-11-19 ENCOUNTER — Ambulatory Visit: Payer: Self-pay | Admitting: Family Medicine

## 2012-11-26 ENCOUNTER — Other Ambulatory Visit: Payer: Self-pay | Admitting: Family Medicine

## 2012-11-26 ENCOUNTER — Ambulatory Visit (INDEPENDENT_AMBULATORY_CARE_PROVIDER_SITE_OTHER): Payer: Medicare Other | Admitting: Family Medicine

## 2012-11-26 ENCOUNTER — Encounter: Payer: Self-pay | Admitting: Family Medicine

## 2012-11-26 VITALS — BP 143/80 | HR 81 | Temp 97.5°F | Ht 70.0 in | Wt 193.0 lb

## 2012-11-26 DIAGNOSIS — F411 Generalized anxiety disorder: Secondary | ICD-10-CM

## 2012-11-26 DIAGNOSIS — R5381 Other malaise: Secondary | ICD-10-CM

## 2012-11-26 DIAGNOSIS — E118 Type 2 diabetes mellitus with unspecified complications: Secondary | ICD-10-CM

## 2012-11-26 DIAGNOSIS — E559 Vitamin D deficiency, unspecified: Secondary | ICD-10-CM | POA: Insufficient documentation

## 2012-11-26 DIAGNOSIS — Z8546 Personal history of malignant neoplasm of prostate: Secondary | ICD-10-CM | POA: Insufficient documentation

## 2012-11-26 DIAGNOSIS — R5383 Other fatigue: Secondary | ICD-10-CM

## 2012-11-26 DIAGNOSIS — I10 Essential (primary) hypertension: Secondary | ICD-10-CM | POA: Insufficient documentation

## 2012-11-26 DIAGNOSIS — E785 Hyperlipidemia, unspecified: Secondary | ICD-10-CM | POA: Insufficient documentation

## 2012-11-26 LAB — HEPATIC FUNCTION PANEL
ALT: 17 U/L (ref 0–53)
Albumin: 4.4 g/dL (ref 3.5–5.2)
Alkaline Phosphatase: 97 U/L (ref 39–117)
Total Protein: 7.4 g/dL (ref 6.0–8.3)

## 2012-11-26 LAB — POCT CBC
Granulocyte percent: 62.2 %G (ref 37–80)
MPV: 7.7 fL (ref 0–99.8)
POC Granulocyte: 5.3 (ref 2–6.9)
Platelet Count, POC: 199 10*3/uL (ref 142–424)
RBC: 4.8 M/uL (ref 4.69–6.13)

## 2012-11-26 LAB — BASIC METABOLIC PANEL WITH GFR
Calcium: 9.7 mg/dL (ref 8.4–10.5)
GFR, Est African American: >89 mL/min
GFR, Est Non African American: 80 mL/min
Potassium: 4.7 mEq/L (ref 3.5–5.3)
Sodium: 142 mEq/L (ref 135–145)

## 2012-11-26 MED ORDER — DIAZEPAM 5 MG PO TABS: 10.0000 mg | ORAL_TABLET | Freq: Two times a day (BID) | ORAL | Status: DC | PRN

## 2012-11-26 MED ORDER — QUINAPRIL HCL 40 MG PO TABS: 40.0000 mg | ORAL_TABLET | Freq: Two times a day (BID) | ORAL | Status: DC

## 2012-11-26 MED ORDER — HYDROCODONE-ACETAMINOPHEN 5-325 MG PO TABS: 1.0000 | ORAL_TABLET | Freq: Four times a day (QID) | ORAL | Status: DC | PRN

## 2012-11-26 MED ORDER — SIMVASTATIN 40 MG PO TABS: 40.0000 mg | ORAL_TABLET | Freq: Every evening | ORAL | Status: DC

## 2012-11-26 NOTE — Patient Instructions (Addendum)
Always be careful and don't put yourself at risk of falling Continue current medication Continue aggressive therapeutic lifestyle changes which include diet and exercise

## 2012-11-26 NOTE — Progress Notes (Signed)
Subjective:    Patient ID: Logan May, male    DOB: 1946/05/04, 67 y.o.   MRN: [id number]  HPI Patient comes in today for followup of chronic medical problems. These include diabetes mellitus with Charcot foot. Other problems include hypertension vitamin D deficiency and a history of prostate cancer with seed implant. He also has hyperlipidemia and atrial fibrillation. He has had recent surgery on his foot. He sees the orthopedist, Dr. Lajoyce Corners, for this.   Review of Systems  Constitutional: Positive for fatigue.  HENT: Positive for congestion (due to allergies) and postnasal drip. Negative for ear pain.   Eyes: Positive for redness and itching. Negative for discharge. Eye pain: due to allergies.  Respiratory: Negative.   Cardiovascular: Negative.   Gastrointestinal: Negative for abdominal pain and constipation.  Endocrine: Negative.   Genitourinary: Negative.   Musculoskeletal: Positive for back pain (LBP, intermitent) and arthralgias (bilateral wrists, L knee, L shoulder).  Skin: Negative.   Allergic/Immunologic: Positive for environmental allergies (seasonal).  Neurological: Positive for dizziness (slight) and weakness (in the AM).  Psychiatric/Behavioral: Positive for sleep disturbance (some within the last month). The patient is nervous/anxious (due to being homebound after surgery).    Patient refuses to see cardiologist for  atrial fibrillation due to financial reasons. He is going to check with his pharmacist to see what the cost of Pradaxa or Eliquis  would be.     Objective:   Physical Exam  Constitutional: He is oriented to person, place, and time. He appears well-developed and well-nourished.  HENT:  Head: Normocephalic and atraumatic.  Right Ear: External ear normal.  Left Ear: External ear normal.  Nose: Nose normal.  Mouth/Throat: Oropharynx is clear and moist. No oropharyngeal exudate.  Eyes: Conjunctivae are normal. Right eye exhibits no discharge. Left eye  exhibits no discharge. No scleral icterus.  Neck: Normal range of motion. Neck supple. No thyromegaly present.  Cardiovascular: Normal rate, regular rhythm and normal heart sounds.  Exam reveals no gallop and no friction rub.   No murmur heard. At 72/ min  Pulmonary/Chest: Effort normal and breath sounds normal. No respiratory distress. He has no wheezes. He has no rales.  Abdominal: Soft. Bowel sounds are normal. He exhibits no distension and no mass. There is no tenderness. There is no rebound and no guarding.  Lymphadenopathy:    He has no cervical adenopathy.  Neurological: He is alert and oriented to person, place, and time. He has normal reflexes. He displays normal reflexes.  Psychiatric: He has a normal mood and affect. His behavior is normal. Judgment and thought content normal.   He has a Charcot foot bilaterally, with recent surgery on the left foot. We were unable to examine his foot do to a dressing and special appliances that he was using. He has a positive attitude about the recent surgery and his progress with the left foot. A. diabetic foot exam was done on the right foot      Assessment & Plan:  1. Diabetes mellitus type 2, controlled, with complications - POCT glycosylated hemoglobin (Hb A1C); Standing - POCT glycosylated hemoglobin (Hb A1C)  2. Hypertension - BASIC METABOLIC PANEL WITH GFR; Standing - BASIC METABOLIC PANEL WITH GFR  3. Vitamin D deficiency - Vitamin D 25 hydroxy; Standing - Vitamin D 25 hydroxy  4. History of prostate cancer  5. Hyperlipidemia - NMR Lipoprofile with Lipids; Standing - Hepatic function panel; Standing - NMR Lipoprofile with Lipids - Hepatic function panel  6. Generalized anxiety disorder  7. Fatigue - POCT CBC; Standing - POCT CBC  Patient Instructions  Always be careful and don't put yourself at risk of falling Continue current medication Continue aggressive therapeutic lifestyle changes which include diet and  exercise   Nyra Capes MD

## 2012-11-27 LAB — NMR LIPOPROFILE WITH LIPIDS
HDL Size: 9.3 nm (ref >=9.2)
HDL-C: 51 mg/dL (ref >=40)
LDL (calc): 66 mg/dL (ref <100)
LDL Size: 20.4 nm — ABNORMAL LOW (ref >20.5)
LP-IR Score: 40 (ref <=45)
Large HDL-P: 9.2 umol/L (ref >=4.8)
Small LDL Particle Number: 539 nmol/L — ABNORMAL HIGH (ref <=527)

## 2012-11-27 LAB — VITAMIN D 25 HYDROXY (VIT D DEFICIENCY, FRACTURES): Vit D, 25-Hydroxy: 57 ng/mL (ref 30–89)

## 2012-11-27 NOTE — Telephone Encounter (Signed)
Last seen 11/26/12  DWM If approved print and route to nurse

## 2012-12-01 ENCOUNTER — Telehealth: Payer: Self-pay | Admitting: *Deleted

## 2012-12-01 NOTE — Telephone Encounter (Signed)
Pt notified RX for Vicodin called into [facility] on [address] [phone]

## 2013-01-08 ENCOUNTER — Other Ambulatory Visit: Payer: Self-pay

## 2013-01-08 NOTE — Telephone Encounter (Signed)
Last seen 11/26/12  DWM    Last filled 11/26/12    If approved route to nurse to phone in

## 2013-01-09 MED ORDER — DIAZEPAM 5 MG PO TABS: 10.0000 mg | ORAL_TABLET | Freq: Two times a day (BID) | ORAL | Status: DC | PRN

## 2013-01-13 ENCOUNTER — Telehealth: Payer: Self-pay | Admitting: Family Medicine

## 2013-01-15 NOTE — Telephone Encounter (Signed)
The dose is correct. 2 of the 5 mg tablets that Dr Christell Constant Rx and I refilled. Otherwise he should contact Dr Christell Constant.

## 2013-01-16 NOTE — Telephone Encounter (Signed)
Please call correct this, I believe that he gets the larger pill and cuts it in half

## 2013-01-19 ENCOUNTER — Other Ambulatory Visit: Payer: Self-pay

## 2013-01-19 MED ORDER — DIAZEPAM 5 MG PO TABS: 10.0000 mg | ORAL_TABLET | Freq: Two times a day (BID) | ORAL | Status: DC | PRN

## 2013-01-19 NOTE — Telephone Encounter (Signed)
RX for Valium 10mg  #180 called into [facility]

## 2013-01-19 NOTE — Telephone Encounter (Signed)
Last seen 11/26/12  DWM  If approved route to nurse to phone into [facility].  [phone]

## 2013-01-19 NOTE — Telephone Encounter (Signed)
Medication correction was called in to pharmacy.

## 2013-02-23 ENCOUNTER — Other Ambulatory Visit: Payer: Self-pay | Admitting: *Deleted

## 2013-02-23 MED ORDER — HYDROCODONE-ACETAMINOPHEN 5-325 MG PO TABS: ORAL_TABLET | ORAL | Status: DC

## 2013-02-23 NOTE — Telephone Encounter (Signed)
This is okay to refill, once prescription is printed and signed, please mail to patient

## 2013-02-23 NOTE — Telephone Encounter (Signed)
Last filled 01/09/13, last seen 11/26/12. Rx will print, route this to pool A, so Rx can be mailed to him

## 2013-02-24 NOTE — Telephone Encounter (Signed)
Mailed to pt 02-23-13

## 2013-03-30 ENCOUNTER — Other Ambulatory Visit: Payer: Self-pay | Admitting: Family Medicine

## 2013-04-21 ENCOUNTER — Telehealth: Payer: Self-pay | Admitting: Family Medicine

## 2013-04-21 NOTE — Telephone Encounter (Signed)
This is okay to refill if you will print it and mail it to him that would be great because of his difficulty getting up here

## 2013-04-22 MED ORDER — HYDROCODONE-ACETAMINOPHEN 5-325 MG PO TABS: ORAL_TABLET | ORAL | Status: DC

## 2013-05-08 ENCOUNTER — Other Ambulatory Visit (HOSPITAL_COMMUNITY): Payer: Self-pay | Admitting: Orthopedic Surgery

## 2013-05-11 ENCOUNTER — Telehealth: Payer: Self-pay | Admitting: Family Medicine

## 2013-05-12 NOTE — Telephone Encounter (Signed)
Please verify the prescriptions he needs to have refills and if he needs to have them mailed to him.

## 2013-05-13 ENCOUNTER — Encounter (HOSPITAL_COMMUNITY): Payer: Self-pay | Admitting: Pharmacist

## 2013-05-15 NOTE — Pre-Procedure Instructions (Signed)
Logan May  05/15/2013   Your procedure is scheduled on:  Wed, Jan 14 @ 10:20 AM  Report to [facility] Stay [facility]tr[facility] A  at 8:15 AM.  Call this number if you have problems the morning of surgery: [phone]   Remember:   Do not eat food or drink liquids after midnight.   Take these medicines the morning of surgery with A SIP OF WATER: Diazepam(Valium),Doxycycline(Vibramycin),Pain Pill(if needed) and Nifedipine(Procardia)               Stop taking your Aspirin and Fish Oil. No Goody's,BC's,Aleve,Ibuprofen,or any Herbal Medications   Do not wear jewelry  Do not wear lotions, powders, or colognes. You may wear deodorant.  Men may shave face and neck.  Do not bring valuables to the hospital.  [facility] is not responsible                  for any belongings or valuables.               Contacts, dentures or bridgework may not be worn into surgery.  Leave suitcase in the car. After surgery it may be brought to your room.  For patients admitted to the hospital, discharge time is determined by your                treatment team.               Patients discharged the day of surgery will not be allowed to drive  home.    Special Instructions: Shower using CHG 2 nights before surgery and the night before surgery.  If you shower the day of surgery use CHG.  Use special wash - you have one bottle of CHG for all showers.  You should use approximately 1/3 of the bottle for each shower.   Please read over the following fact sheets that you were given: Pain Booklet, Coughing and Deep Breathing and Surgical Site Infection Prevention

## 2013-05-18 ENCOUNTER — Encounter (HOSPITAL_COMMUNITY): Payer: Self-pay

## 2013-05-18 ENCOUNTER — Encounter (HOSPITAL_COMMUNITY)
Admission: RE | Admit: 2013-05-18 | Discharge: 2013-05-18 | Disposition: A | Discharge status: Home / Self Care | Payer: Medicare Other | Source: Ambulatory Visit | Attending: Orthopedic Surgery | Admitting: Orthopedic Surgery

## 2013-05-18 HISTORY — DX: Polyneuropathy, unspecified: G62.9

## 2013-05-18 HISTORY — DX: Headache: R51

## 2013-05-18 HISTORY — DX: Hyperlipidemia, unspecified: E78.5

## 2013-05-18 LAB — PROTIME-INR
INR: 1.04 (ref 0.00–1.49)
Prothrombin Time: 13.4 seconds (ref 11.6–15.2)

## 2013-05-18 LAB — CBC
HCT: 41.5 % (ref 39.0–52.0)
Hemoglobin: 14.4 g/dL (ref 13.0–17.0)
MCH: 31.2 pg (ref 26.0–34.0)
MCHC: 34.7 g/dL (ref 30.0–36.0)
MCV: 90 fL (ref 78.0–100.0)
Platelets: 218 10*3/uL (ref 150–400)
RBC: 4.61 MIL/uL (ref 4.22–5.81)
RDW: 13.3 % (ref 11.5–15.5)
WBC: 8.8 10*3/uL (ref 4.0–10.5)

## 2013-05-18 LAB — COMPREHENSIVE METABOLIC PANEL
ALBUMIN: 3.7 g/dL (ref 3.5–5.2)
ALT: 15 U/L (ref 0–53)
AST: 16 U/L (ref 0–37)
Alkaline Phosphatase: 113 U/L (ref 39–117)
BUN: 16 mg/dL (ref 6–23)
CALCIUM: 8.9 mg/dL (ref 8.4–10.5)
CO2: 29 meq/L (ref 19–32)
CREATININE: 1.07 mg/dL (ref 0.50–1.35)
Chloride: 99 mEq/L (ref 96–112)
GFR calc Af Amer: 80 mL/min — ABNORMAL LOW (ref >90)
GFR, EST NON AFRICAN AMERICAN: 69 mL/min — AB (ref >90)
Glucose, Bld: 238 mg/dL — ABNORMAL HIGH (ref 70–99)
Potassium: 4.2 mEq/L (ref 3.7–5.3)
SODIUM: 140 meq/L (ref 137–147)
TOTAL PROTEIN: 7.4 g/dL (ref 6.0–8.3)
Total Bilirubin: 0.4 mg/dL (ref 0.3–1.2)

## 2013-05-18 LAB — APTT: aPTT: 32 seconds (ref 24–37)

## 2013-05-18 NOTE — Progress Notes (Addendum)
  Pt doesn't have a cardiologist  Denies ever having an echo/stress test/heart cath   EKG and CXR in epic from 09-17-12  Medical Md is Dr.Donald Laurance Flatten

## 2013-05-19 MED ORDER — CEFAZOLIN SODIUM-DEXTROSE 2-3 GM-% IV SOLR
2.0000 g | INTRAVENOUS | Status: AC
  Administered 2013-05-20: 2 g via INTRAVENOUS
  Filled 2013-05-19: qty 50

## 2013-05-20 ENCOUNTER — Ambulatory Visit (HOSPITAL_COMMUNITY)
Admission: RE | Admit: 2013-05-20 | Discharge: 2013-05-20 | Disposition: A | Discharge status: Home / Self Care | Payer: Medicare Other | Source: Ambulatory Visit | Attending: Orthopedic Surgery | Admitting: Orthopedic Surgery

## 2013-05-20 ENCOUNTER — Ambulatory Visit (HOSPITAL_COMMUNITY): Payer: Medicare Other | Admitting: Anesthesiology

## 2013-05-20 ENCOUNTER — Encounter (HOSPITAL_COMMUNITY): Payer: Self-pay

## 2013-05-20 ENCOUNTER — Encounter (HOSPITAL_COMMUNITY)
Admission: RE | Disposition: A | Discharge status: Home / Self Care | Payer: Self-pay | Source: Ambulatory Visit | Attending: Orthopedic Surgery

## 2013-05-20 ENCOUNTER — Encounter (HOSPITAL_COMMUNITY): Payer: Medicare Other | Admitting: Anesthesiology

## 2013-05-20 DIAGNOSIS — Z01812 Encounter for preprocedural laboratory examination: Secondary | ICD-10-CM | POA: Insufficient documentation

## 2013-05-20 DIAGNOSIS — I10 Essential (primary) hypertension: Secondary | ICD-10-CM | POA: Insufficient documentation

## 2013-05-20 DIAGNOSIS — M869 Osteomyelitis, unspecified: Secondary | ICD-10-CM

## 2013-05-20 DIAGNOSIS — N400 Enlarged prostate without lower urinary tract symptoms: Secondary | ICD-10-CM | POA: Insufficient documentation

## 2013-05-20 DIAGNOSIS — F411 Generalized anxiety disorder: Secondary | ICD-10-CM | POA: Insufficient documentation

## 2013-05-20 DIAGNOSIS — M9080 Osteopathy in diseases classified elsewhere, unspecified site: Secondary | ICD-10-CM | POA: Insufficient documentation

## 2013-05-20 DIAGNOSIS — Z8546 Personal history of malignant neoplasm of prostate: Secondary | ICD-10-CM | POA: Insufficient documentation

## 2013-05-20 DIAGNOSIS — E785 Hyperlipidemia, unspecified: Secondary | ICD-10-CM | POA: Insufficient documentation

## 2013-05-20 DIAGNOSIS — M86679 Other chronic osteomyelitis, unspecified ankle and foot: Secondary | ICD-10-CM | POA: Insufficient documentation

## 2013-05-20 DIAGNOSIS — E1169 Type 2 diabetes mellitus with other specified complication: Secondary | ICD-10-CM | POA: Insufficient documentation

## 2013-05-20 DIAGNOSIS — G589 Mononeuropathy, unspecified: Secondary | ICD-10-CM | POA: Insufficient documentation

## 2013-05-20 HISTORY — PX: AMPUTATION: SHX166

## 2013-05-20 LAB — GLUCOSE, CAPILLARY
Glucose-Capillary: 149 mg/dL — ABNORMAL HIGH (ref 70–99)
Glucose-Capillary: 160 mg/dL — ABNORMAL HIGH (ref 70–99)

## 2013-05-20 SURGERY — AMPUTATION DIGIT
Anesthesia: General | Site: Foot | Laterality: Left

## 2013-05-20 MED ORDER — FENTANYL CITRATE 0.05 MG/ML IJ SOLN
INTRAMUSCULAR | Status: DC | PRN
  Administered 2013-05-20: 50 ug via INTRAVENOUS

## 2013-05-20 MED ORDER — LACTATED RINGERS IV SOLN
INTRAVENOUS | Status: DC
  Administered 2013-05-20: 08:00:00 via INTRAVENOUS

## 2013-05-20 MED ORDER — PROPOFOL 10 MG/ML IV BOLUS
INTRAVENOUS | Status: DC | PRN
  Administered 2013-05-20 (×3): 20 mg via INTRAVENOUS

## 2013-05-20 MED ORDER — LACTATED RINGERS IV SOLN
INTRAVENOUS | Status: DC | PRN
  Administered 2013-05-20: 09:00:00 via INTRAVENOUS

## 2013-05-20 MED ORDER — 0.9 % SODIUM CHLORIDE (POUR BTL) OPTIME
TOPICAL | Status: DC | PRN
  Administered 2013-05-20: 1000 mL

## 2013-05-20 MED ORDER — OXYCODONE-ACETAMINOPHEN 5-325 MG PO TABS: 1.0000 | ORAL_TABLET | ORAL | Status: DC | PRN

## 2013-05-20 MED ORDER — LIDOCAINE-EPINEPHRINE 1 %-1:100000 IJ SOLN
INTRAMUSCULAR | Status: DC | PRN
  Administered 2013-05-20: 10 mL

## 2013-05-20 MED ORDER — MIDAZOLAM HCL 5 MG/5ML IJ SOLN
INTRAMUSCULAR | Status: DC | PRN
  Administered 2013-05-20: 1 mg via INTRAVENOUS

## 2013-05-20 SURGICAL SUPPLY — 32 items
BANDAGE GAUZE ELAST BULKY 4 IN (GAUZE/BANDAGES/DRESSINGS) ×3 IMPLANT
BNDG CMPR 9X4 STRL LF SNTH (GAUZE/BANDAGES/DRESSINGS)
BNDG COHESIVE 4X5 TAN STRL (GAUZE/BANDAGES/DRESSINGS) ×3 IMPLANT
BNDG COHESIVE 6X5 TAN STRL LF (GAUZE/BANDAGES/DRESSINGS) ×2 IMPLANT
BNDG ESMARK 4X9 LF (GAUZE/BANDAGES/DRESSINGS) IMPLANT
CLOTH BEACON ORANGE TIMEOUT ST (SAFETY) ×3 IMPLANT
COVER SURGICAL LIGHT HANDLE (MISCELLANEOUS) ×3 IMPLANT
DRAPE U-SHAPE 47X51 STRL (DRAPES) ×3 IMPLANT
DRSG ADAPTIC 3X8 NADH LF (GAUZE/BANDAGES/DRESSINGS) ×2 IMPLANT
DRSG PAD ABDOMINAL 8X10 ST (GAUZE/BANDAGES/DRESSINGS) ×3 IMPLANT
DURAPREP 26ML APPLICATOR (WOUND CARE) ×3 IMPLANT
ELECT REM PT RETURN 9FT ADLT (ELECTROSURGICAL) ×3
ELECTRODE REM PT RTRN 9FT ADLT (ELECTROSURGICAL) ×1 IMPLANT
GLOVE BIOGEL PI IND STRL 9 (GLOVE) ×1 IMPLANT
GLOVE BIOGEL PI INDICATOR 9 (GLOVE) ×2
GLOVE SURG ORTHO 9.0 STRL STRW (GLOVE) ×3 IMPLANT
GOWN PREVENTION PLUS XLARGE (GOWN DISPOSABLE) ×3 IMPLANT
GOWN SRG XL XLNG 56XLVL 4 (GOWN DISPOSABLE) ×1 IMPLANT
GOWN STRL NON-REIN XL XLG LVL4 (GOWN DISPOSABLE) ×3
KIT BASIN OR (CUSTOM PROCEDURE TRAY) ×3 IMPLANT
KIT ROOM TURNOVER OR (KITS) ×3 IMPLANT
MANIFOLD NEPTUNE II (INSTRUMENTS) ×3 IMPLANT
NEEDLE 22X1 1/2 (OR ONLY) (NEEDLE) IMPLANT
NS IRRIG 1000ML POUR BTL (IV SOLUTION) ×3 IMPLANT
PACK ORTHO EXTREMITY (CUSTOM PROCEDURE TRAY) ×3 IMPLANT
PAD ARMBOARD 7.5X6 YLW CONV (MISCELLANEOUS) ×6 IMPLANT
SPONGE GAUZE 4X4 12PLY (GAUZE/BANDAGES/DRESSINGS) ×2 IMPLANT
SUCTION FRAZIER TIP 10 FR DISP (SUCTIONS) IMPLANT
SUT ETHILON 2 0 PSLX (SUTURE) ×3 IMPLANT
SYR CONTROL 10ML LL (SYRINGE) IMPLANT
TOWEL OR 17X24 6PK STRL BLUE (TOWEL DISPOSABLE) ×3 IMPLANT
TOWEL OR 17X26 10 PK STRL BLUE (TOWEL DISPOSABLE) ×3 IMPLANT

## 2013-05-20 NOTE — H&P (Signed)
Logan May is an 68 y.o. male.   Chief Complaint: Chronic ulceration osteomyelitis left great toe HPI: Patient is a 68 year old gentleman with chronic ulceration of the right great toe he has failed prolonged conservative wound care with pressure unloading oral antibiotics and topical wound care.  Past Medical History  Diagnosis Date  . BPH (benign prostatic hypertrophy)   . Prostate cancer     implants   . Vitamin D deficiency     takes Vit D daily  . History of ETT 1999    Dr, Caleen Essex  . Anxiety     takes Valium daily as needed  . Hyperlipidemia     takes Simvastatin nightly  . Hypertension     takes Quinapril and Procardia daily  . Headache(784.0)     occasionally  . Peripheral neuropathy   . Diabetes mellitus, type 2     but doesn't take any meds for it;diet controlled and exercise  . 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 Foot surgery  May 2002    Dr. Sharol Given   . Left foot casted 4-5 months    . Bilateral  eye laser surgery  2000  . Charcot foot left  12/15/01  . Laser eye surgery right  12/2002  . Eye surgery    . Tonsillectomy    . I&d extremity Left 09/05/2012    Procedure: IRRIGATION AND DEBRIDEMENT EXTREMITY;  Surgeon: Newt Minion, MD;  Location: [place];  Service: Orthopedics;  Laterality: Left;  Excision Charcot Collapse Left Foot and Base 5th Metatarsal, Antibiotic Beads  . Esophagogastroduodenoscopy      at least 62yrs ago    No family history on file. Social History:  reports that he has never smoked. He does not have any smokeless tobacco history on file. He reports that he drinks about 3.6 ounces of alcohol per week. He reports that he does not use illicit drugs.  Allergies:  Allergies  Allergen Reactions  . Tetanus Toxoids Other (See Comments)    "Blacked out"    No prescriptions prior to admission    Results for orders placed during the hospital encounter of 05/18/13 (from the past 48 hour(s))   APTT     Status: None   Collection Time    05/18/13  9:28 AM      Result Value Range   aPTT 32  24 - 37 seconds  CBC     Status: None   Collection Time    05/18/13  9:28 AM      Result Value Range   WBC 8.8  4.0 - 10.5 K/uL   RBC 4.61  4.22 - 5.81 MIL/uL   Hemoglobin 14.4  13.0 - 17.0 g/dL   HCT 41.5  39.0 - 52.0 %   MCV 90.0  78.0 - 100.0 fL   MCH 31.2  26.0 - 34.0 pg   MCHC 34.7  30.0 - 36.0 g/dL   RDW 13.3  11.5 - 15.5 %   Platelets 218  150 - 400 K/uL  COMPREHENSIVE METABOLIC PANEL     Status: Abnormal   Collection Time    05/18/13  9:28 AM      Result Value Range   Sodium 140  137 - 147 mEq/L   Potassium 4.2  3.7 - 5.3 mEq/L   Chloride 99  96 - 112 mEq/L   CO2 29  19 - 32 mEq/L   Glucose, Bld 238 (*)  70 - 99 mg/dL   BUN 16  6 - 23 mg/dL   Creatinine, Ser 1.07  0.50 - 1.35 mg/dL   Calcium 8.9  8.4 - 10.5 mg/dL   Total Protein 7.4  6.0 - 8.3 g/dL   Albumin 3.7  3.5 - 5.2 g/dL   AST 16  0 - 37 U/L   ALT 15  0 - 53 U/L   Alkaline Phosphatase 113  39 - 117 U/L   Total Bilirubin 0.4  0.3 - 1.2 mg/dL   GFR calc non Af Amer 69 (*) >90 mL/min   GFR calc Af Amer 80 (*) >90 mL/min   Comment: (NOTE)     The eGFR has been calculated using the CKD EPI equation.     This calculation has not been validated in all clinical situations.     eGFR's persistently <90 mL/min signify possible Chronic Kidney     Disease.  PROTIME-INR     Status: None   Collection Time    05/18/13  9:28 AM      Result Value Range   Prothrombin Time 13.4  11.6 - 15.2 seconds   INR 1.04  0.00 - 1.49   No results found.  Review of Systems  All other systems reviewed and are negative.    There were no vitals taken for this visit. Physical Exam  On examination patient has chronic ulceration with osteomyelitis of the left great toe. Assessment/Plan Assessment: Osteomyelitis ulceration left great toe.  Plan: Will plan for amputation of the left great toe risks and benefits were discussed  including persistent infection nonhealing of the wound need for additional surgery. Patient states he understands and wished to proceed at this time.  May,Logan V 05/20/2013, 6:25 AM

## 2013-05-20 NOTE — Anesthesia Preprocedure Evaluation (Addendum)
Anesthesia Evaluation  Patient identified by MRN, date of birth, ID band Patient awake    Reviewed: Allergy & Precautions, H&P , NPO status , Patient's Chart, lab work & pertinent test results  Airway Mallampati: II TM Distance: <3 FB Neck ROM: full    Dental  (+) Teeth Intact and Dental Advidsory Given   Pulmonary  breath sounds clear to auscultation        Cardiovascular hypertension, Rhythm:Regular Rate:Normal     Neuro/Psych  Headaches, PSYCHIATRIC DISORDERS  Neuromuscular disease    GI/Hepatic   Endo/Other  diabetes  Renal/GU      Musculoskeletal   Abdominal   Peds  Hematology   Anesthesia Other Findings   Reproductive/Obstetrics                          Anesthesia Physical Anesthesia Plan  ASA: II  Anesthesia Plan: General   Post-op Pain Management:    Induction: Intravenous  Airway Management Planned: LMA  Additional Equipment:   Intra-op Plan:   Post-operative Plan: Extubation in OR  Informed Consent:   Dental Advisory Given  Plan Discussed with:   Anesthesia Plan Comments:        Anesthesia Quick Evaluation

## 2013-05-20 NOTE — Transfer of Care (Signed)
Immediate Anesthesia Transfer of Care Note  Patient: Logan May  Procedure(s) Performed: Procedure(s) with comments: AMPUTATION DIGIT (Left) - Left Great Toe Amputation at MTP  Patient Location: PACU  Anesthesia Type:MAC  Level of Consciousness: awake, alert  and oriented  Airway & Oxygen Therapy: Patient Spontanous Breathing and Patient connected to nasal cannula oxygen  Post-op Assessment: Report given to PACU RN, Post -op Vital signs reviewed and stable and Patient moving all extremities X 4  Post vital signs: Reviewed and stable  Complications: No apparent anesthesia complications

## 2013-05-20 NOTE — Op Note (Signed)
OPERATIVE REPORT  DATE OF SURGERY: 05/20/2013  PATIENT:  Logan May,  68 y.o. male  PRE-OPERATIVE DIAGNOSIS:  Osteomyelitis Left Great Toe  POST-OPERATIVE DIAGNOSIS:  Osteomyelitis Left Great Toe  PROCEDURE:  Procedure(s): AMPUTATION DIGIT left great toe at the MTP joint  SURGEON:  Surgeon(s): Newt Minion, MD  ANESTHESIA:   Ankle block  EBL:  Minimal ML  SPECIMEN:  Source of Specimen:  Left great toe  TOURNIQUET:  * No tourniquets in log *  PROCEDURE DETAILS: Patient is a 68 year old gentleman who presents with osteomyelitis of the left great toe. He has failed conservative care he is a Wagner grade 3 ulcer presents at this time for amputation through the MTP joint. Risks and benefits were discussed including nonhealing of the wound need for additional surgery. Patient states he understands and wished to proceed at this time. Description of procedure patient was brought to the operating room and underwent a local anesthetic with an ankle block. After adequate levels of anesthesia were obtained patient's left lower extremity was prepped using DuraPrep draped into a sterile field. A fishmouth incision was made just distal to the MTP joint. The toe was amputated through the MTP joint the wound was irrigated with normal saline hemostasis was obtained the incision was closed using 2-0 nylon. The wound is covered with Adaptic orthopedic sponges AB dressing Kerlix and Coban. Patient was taken to the PACU in stable condition.  PLAN OF CARE: Discharge to home after PACU  PATIENT DISPOSITION:  PACU - hemodynamically stable.   Newt Minion, MD 05/20/2013 9:47 AM

## 2013-05-20 NOTE — Preoperative (Signed)
Beta Blockers   Reason not to administer Beta Blockers:Not Applicable 

## 2013-05-20 NOTE — Anesthesia Procedure Notes (Addendum)
Anesthesia Regional Block:  Ankle block  Pre-Anesthetic Checklist: ,, timeout performed, Correct Patient, Correct Site, Correct Laterality, Correct Procedure,, site marked, risks and benefits discussed,, at surgeon's request  Laterality: Lower and Left  Prep: chloraprep       Needles:   Needle Type: Other     Needle Length: 2.5cm  Needle Gauge: 25 and 25 G    Additional Needles:  Procedures: other Ankle block Narrative:  Start time: 05/20/2013 8:45 AM End time: 05/20/2013 8:56 AM Injection made incrementally with aspirations every 5 mL.  Performed by: Personally  Anesthesiologist: T Massagee  Additional Notes: Ankle Block performed.BP cuff, EKG monitor applied. Peri-ankle infiltration performed. Lidocaine 1% c Epi   Procedure Name: MAC Date/Time: 05/20/2013 9:30 AM Performed by: Neldon Newport Pre-anesthesia Checklist: Patient being monitored, Suction available, Emergency Drugs available, Timeout performed and Patient identified Patient Re-evaluated:Patient Re-evaluated prior to inductionOxygen Delivery Method: Nasal cannula Placement Confirmation: positive ETCO2

## 2013-05-20 NOTE — Anesthesia Postprocedure Evaluation (Signed)
  Anesthesia Post-op Note  Patient: Logan May  Procedure(s) Performed: Procedure(s) with comments: AMPUTATION DIGIT (Left) - Left Great Toe Amputation at MTP  Patient Location: PACU  Anesthesia Type:General  Level of Consciousness: awake and alert   Airway and Oxygen Therapy: Patient Spontanous Breathing  Post-op Pain: mild  Post-op Assessment: Post-op Vital signs reviewed  Post-op Vital Signs: stable  Complications: No apparent anesthesia complications

## 2013-05-22 ENCOUNTER — Encounter (HOSPITAL_COMMUNITY): Payer: Self-pay | Admitting: Orthopedic Surgery

## 2013-06-04 ENCOUNTER — Encounter: Payer: Self-pay | Admitting: Family Medicine

## 2013-06-04 ENCOUNTER — Ambulatory Visit (INDEPENDENT_AMBULATORY_CARE_PROVIDER_SITE_OTHER): Payer: Medicare Other | Admitting: Family Medicine

## 2013-06-04 VITALS — BP 155/80 | HR 87 | Temp 98.0°F | Ht 71.0 in | Wt 195.0 lb

## 2013-06-04 DIAGNOSIS — Z8546 Personal history of malignant neoplasm of prostate: Secondary | ICD-10-CM

## 2013-06-04 DIAGNOSIS — Z89422 Acquired absence of other left toe(s): Secondary | ICD-10-CM | POA: Insufficient documentation

## 2013-06-04 DIAGNOSIS — E11610 Type 2 diabetes mellitus with diabetic neuropathic arthropathy: Secondary | ICD-10-CM | POA: Insufficient documentation

## 2013-06-04 DIAGNOSIS — E118 Type 2 diabetes mellitus with unspecified complications: Secondary | ICD-10-CM

## 2013-06-04 DIAGNOSIS — E559 Vitamin D deficiency, unspecified: Secondary | ICD-10-CM

## 2013-06-04 DIAGNOSIS — S98139A Complete traumatic amputation of one unspecified lesser toe, initial encounter: Secondary | ICD-10-CM

## 2013-06-04 DIAGNOSIS — I10 Essential (primary) hypertension: Secondary | ICD-10-CM

## 2013-06-04 DIAGNOSIS — E1149 Type 2 diabetes mellitus with other diabetic neurological complication: Secondary | ICD-10-CM

## 2013-06-04 DIAGNOSIS — E785 Hyperlipidemia, unspecified: Secondary | ICD-10-CM

## 2013-06-04 LAB — POCT CBC
Granulocyte percent: 70.1 %G (ref 37–80)
HCT, POC: 42.9 % — AB (ref 43.5–53.7)
Hemoglobin: 14.2 g/dL (ref 14.1–18.1)
Lymph, poc: 2.3 (ref 0.6–3.4)
MCH, POC: 30.1 pg (ref 27–31.2)
MCHC: 33 g/dL (ref 31.8–35.4)
MCV: 91.3 fL (ref 80–97)
MPV: 8 fL (ref 0–99.8)
PLATELET COUNT, POC: 213 10*3/uL (ref 142–424)
POC GRANULOCYTE: 5.5 (ref 2–6.9)
POC LYMPH %: 28.6 % (ref 10–50)
RBC: 4.7 M/uL (ref 4.69–6.13)
RDW, POC: 13 %
WBC: 7.8 10*3/uL (ref 4.6–10.2)

## 2013-06-04 LAB — POCT URINALYSIS DIPSTICK
BILIRUBIN UA: NEGATIVE
Blood, UA: NEGATIVE
GLUCOSE UA: NEGATIVE
Ketones, UA: NEGATIVE
LEUKOCYTES UA: NEGATIVE
NITRITE UA: NEGATIVE
PH UA: 6
Protein, UA: NEGATIVE
Spec Grav, UA: 1.015
Urobilinogen, UA: NEGATIVE

## 2013-06-04 LAB — POCT UA - MICROSCOPIC ONLY
Bacteria, U Microscopic: NEGATIVE
Casts, Ur, LPF, POC: NEGATIVE
Crystals, Ur, HPF, POC: NEGATIVE
Mucus, UA: NEGATIVE
RBC, URINE, MICROSCOPIC: NEGATIVE
WBC, Ur, HPF, POC: NEGATIVE
YEAST UA: NEGATIVE

## 2013-06-04 LAB — POCT GLYCOSYLATED HEMOGLOBIN (HGB A1C): HEMOGLOBIN A1C: 6.8

## 2013-06-04 MED ORDER — DIAZEPAM 10 MG PO TABS: 5.0000 mg | ORAL_TABLET | Freq: Two times a day (BID) | ORAL | Status: DC | PRN

## 2013-06-04 MED ORDER — HYDROCODONE-ACETAMINOPHEN 5-325 MG PO TABS
ORAL_TABLET | ORAL | Status: DC
Start: 2013-06-04 — End: 2013-06-04

## 2013-06-04 MED ORDER — QUINAPRIL HCL 40 MG PO TABS: 40.0000 mg | ORAL_TABLET | Freq: Two times a day (BID) | ORAL | Status: DC

## 2013-06-04 MED ORDER — SIMVASTATIN 40 MG PO TABS: 40.0000 mg | ORAL_TABLET | Freq: Every evening | ORAL | Status: DC

## 2013-06-04 MED ORDER — NIFEDIPINE ER OSMOTIC RELEASE 30 MG PO TB24: 30.0000 mg | ORAL_TABLET | Freq: Every day | ORAL | Status: DC

## 2013-06-04 MED ORDER — HYDROCODONE-ACETAMINOPHEN 5-325 MG PO TABS: ORAL_TABLET | ORAL | Status: DC

## 2013-06-04 NOTE — Progress Notes (Signed)
Subjective:    Patient ID: Logan May, male    DOB: 1945/12/13, 68 y.o.   MRN: [id number]  HPI Pt here for follow up and management of chronic medical problems. The patient is status post amputation of the left great toe do to ulcer or that was not healing. He apparently tolerated the procedure well. As far as exam today he is do you a rectal exam lab work and Dietitian. He is also due for a colonoscopy. All medications will be refilled.       Patient Active Problem List   Diagnosis Date Noted  . Diabetes mellitus type 2, controlled, with complications 42/87/6811  . Hypertension 11/26/2012  . Vitamin D deficiency 11/26/2012  . History of prostate cancer 11/26/2012  . Hyperlipidemia 11/26/2012  . Generalized anxiety disorder 11/26/2012   Outpatient Encounter Prescriptions as of 06/04/2013  Medication Sig  . aspirin 325 MG tablet Take 81 mg by mouth daily. Takes 1/4th tablet once daily.  . Cholecalciferol 2000 UNITS CAPS Take 2,000-4,000 Units by mouth daily. Takes 2000 units Monday- Friday , and 4000 units on Saturday and Sunday.  . diazepam (VALIUM) 10 MG tablet Take 5 mg by mouth every 12 (twelve) hours as needed for anxiety.  Marland Kitchen doxycycline (VIBRAMYCIN) 100 MG capsule Take 100 mg by mouth 2 (two) times daily.  . fish oil-omega-3 fatty acids 1000 MG capsule Take 1 g by mouth every other day.   Marland Kitchen NIFEdipine (PROCARDIA-XL/ADALAT-CC/NIFEDICAL-XL) 30 MG 24 hr tablet Take 30 mg by mouth daily at 6 (six) AM.  . quinapril (ACCUPRIL) 40 MG tablet Take 40 mg by mouth 2 (two) times daily. Do not take within 2 hrs of taking doxycycline. (Space at least 2 hours from taking doxycycline).  . simvastatin (ZOCOR) 40 MG tablet Take 1 tablet (40 mg total) by mouth every evening.  Marland Kitchen HYDROcodone-acetaminophen (NORCO/VICODIN) 5-325 MG per tablet Take 1 tablet by mouth 4 (four) times daily as needed (pain).  . [DISCONTINUED] oxyCODONE-acetaminophen (ROXICET) 5-325 MG per tablet Take 1 tablet by mouth  every 4 (four) hours as needed for severe pain.    Review of Systems  Constitutional: Negative.   HENT: Negative.   Eyes: Negative.   Respiratory: Negative.   Cardiovascular: Negative.   Gastrointestinal: Negative.   Endocrine: Negative.   Genitourinary: Negative.   Musculoskeletal: Positive for arthralgias (recent left great toe surgery).  Skin: Negative.   Allergic/Immunologic: Negative.   Neurological: Negative.   Hematological: Negative.   Psychiatric/Behavioral: Negative.        Objective:   Physical Exam  Nursing note and vitals reviewed. Constitutional: He is oriented to person, place, and time. He appears well-developed and well-nourished. No distress.  HENT:  Head: Normocephalic and atraumatic.  Right Ear: External ear normal.  Left Ear: External ear normal.  Nose: Nose normal.  Mouth/Throat: Oropharynx is clear and moist. No oropharyngeal exudate.  Eyes: Conjunctivae and EOM are normal. Pupils are equal, round, and reactive to light. Right eye exhibits no discharge. Left eye exhibits no discharge. No scleral icterus.  His left is outwardly deviated and this is normal for this patient  Neck: Normal range of motion. Neck supple. No thyromegaly present.  Cardiovascular: Normal rate, regular rhythm, normal heart sounds and intact distal pulses.  Exam reveals no gallop and no friction rub.   No murmur heard. At 72 per minute  Pulmonary/Chest: Effort normal and breath sounds normal. No respiratory distress. He has no wheezes. He has no rales. He exhibits no tenderness.  Abdominal: Soft. Bowel sounds are normal. He exhibits no mass. There is no tenderness. There is no rebound and no guarding.  Genitourinary: Rectum normal and penis normal.  The prostate is flat G-tube previous seed implants from treatment of prostate cancer. There no rectal masses. There no hernias palpable and the external genitalia is normal.  Musculoskeletal: He exhibits no edema and no tenderness.    Patient has a bilateral Charcot foot left greater than right. He has a big toes amputation on the right done recently and this area appears to be healing well with only slight erythema. A. diabetic foot exam was included in this visit. Patient uses a cane and has somewhat limited range of motion due to 2 Charcot foot deformities bilaterally.  Lymphadenopathy:    He has no cervical adenopathy.  Neurological: He is alert and oriented to person, place, and time. He has normal reflexes. No cranial nerve deficit.  See diabetic foot exam  Skin: Skin is warm and dry. No rash noted. No erythema. No pallor.  Psychiatric: He has a normal mood and affect. His behavior is normal. Judgment and thought content normal.   BP 155/80  Pulse 87  Temp(Src) 98 F (36.7 C) (Oral)  Ht 5' 11" (1.803 m)  Wt 195 lb (88.451 kg)  BMI 27.21 kg/m2        Assessment & Plan:  1. Diabetes mellitus type 2, controlled, with complications - POCT CBC - POCT glycosylated hemoglobin (Hb A1C)  2. Hyperlipidemia - POCT CBC - NMR, lipoprofile  3. Hypertension - POCT CBC - BMP8+EGFR - Hepatic function panel  4. Vitamin D deficiency - POCT CBC - Vit D  25 hydroxy (rtn osteoporosis monitoring)  5. History of prostate cancer - POCT CBC - POCT UA - Microscopic Only - POCT urinalysis dipstick - PSA, total and free  6. Status post amputation of toe of left foot -Continue followup with orthopedic surgeon  7. Charcot foot due to diabetes mellitus Patient Instructions  Continue current medications. Continue good therapeutic lifestyle changes which include good diet and exercise. Fall precautions discussed with patient. Schedule your flu vaccine if you haven't had it yet If you are over 72 years old - you may need Prevnar 85 or the adult Pneumonia vaccine. Continue to monitor blood sugars regularly Return to the FOBT Prescriptions will be refilled today We will call you with the results of her lab work once  those results are available The Prevnar vaccine may make your arm sore Monitor blood pressures at home   Arrie Senate MD

## 2013-06-04 NOTE — Patient Instructions (Addendum)
Continue current medications. Continue good therapeutic lifestyle changes which include good diet and exercise. Fall precautions discussed with patient. Schedule your flu vaccine if you haven't had it yet If you are over 70 years old - you may need Prevnar 30 or the adult Pneumonia vaccine. Continue to monitor blood sugars regularly Return to the FOBT Prescriptions will be refilled today We will call you with the results of her lab work once those results are available The Prevnar vaccine may make your arm sore Monitor blood pressures at home

## 2013-06-05 ENCOUNTER — Telehealth: Payer: Self-pay | Admitting: Family Medicine

## 2013-06-05 NOTE — Telephone Encounter (Signed)
Pt notified that RXs were printed and given to him at appt While on the phone with the pt he checked his paperwork and did find the RXs for Diazepam and Vicodin Explained to pt that he will need to take these to pharmacy for refills Pt verbalizes understanding

## 2013-06-06 LAB — NMR, LIPOPROFILE
CHOLESTEROL: 155 mg/dL (ref <200)
HDL CHOLESTEROL BY NMR: 70 mg/dL (ref >=40)
HDL Particle Number: 37.6 umol/L (ref >=30.5)
LDL Particle Number: 738 nmol/L (ref <1000)
LDL Size: 21.1 nm (ref >20.5)
LDLC SERPL CALC-MCNC: 67 mg/dL (ref <100)
LP-IR Score: 39 (ref <=45)
Triglycerides by NMR: 90 mg/dL (ref <150)

## 2013-06-06 LAB — HEPATIC FUNCTION PANEL
ALT: 21 IU/L (ref 0–44)
AST: 25 IU/L (ref 0–40)
Albumin: 4.6 g/dL (ref 3.6–4.8)
Alkaline Phosphatase: 107 IU/L (ref 39–117)
BILIRUBIN TOTAL: 0.5 mg/dL (ref 0.0–1.2)
Bilirubin, Direct: 0.19 mg/dL (ref 0.00–0.40)
TOTAL PROTEIN: 7.4 g/dL (ref 6.0–8.5)

## 2013-06-06 LAB — BMP8+EGFR
BUN/Creatinine Ratio: 9 — ABNORMAL LOW (ref 10–22)
BUN: 9 mg/dL (ref 8–27)
CALCIUM: 9.6 mg/dL (ref 8.6–10.2)
CO2: 27 mmol/L (ref 18–29)
CREATININE: 0.97 mg/dL (ref 0.76–1.27)
Chloride: 99 mmol/L (ref 97–108)
GFR calc Af Amer: 92 mL/min/{1.73_m2} (ref >59)
GFR calc non Af Amer: 79 mL/min/{1.73_m2} (ref >59)
GLUCOSE: 154 mg/dL — AB (ref 65–99)
Potassium: 4.2 mmol/L (ref 3.5–5.2)
SODIUM: 144 mmol/L (ref 134–144)

## 2013-06-06 LAB — PSA, TOTAL AND FREE
PSA FREE: 0.01 ng/mL
PSA, Free Pct: >10 %
PSA: <0.1 ng/mL (ref 0.0–4.0)

## 2013-06-06 LAB — VITAMIN D 25 HYDROXY (VIT D DEFICIENCY, FRACTURES): Vit D, 25-Hydroxy: 52.8 ng/mL (ref 30.0–100.0)

## 2013-06-25 ENCOUNTER — Telehealth: Payer: Self-pay | Admitting: Family Medicine

## 2013-06-25 NOTE — Telephone Encounter (Signed)
Please call patient to find out what questions he  has about the medication

## 2013-06-29 MED ORDER — HYDROCODONE-ACETAMINOPHEN 5-325 MG PO TABS: 1.0000 | ORAL_TABLET | Freq: Four times a day (QID) | ORAL | Status: DC | PRN

## 2013-06-29 NOTE — Telephone Encounter (Signed)
Pt states that hydrocodone bid is not enough. He is needing to take it 3-4 times a day. Bid is not enough and would like a new rx written. It will be due this week anyway.  He states that his pain is chronic and he is already using IBU for breakthrough pain.

## 2013-06-29 NOTE — Telephone Encounter (Signed)
Change this prescription into one 3-4 times daily as needed for pain He will have to give him a new prescription each month,. He will need to call and we can mail this prescription in to him.

## 2013-08-03 ENCOUNTER — Telehealth: Payer: Self-pay | Admitting: Family Medicine

## 2013-08-03 MED ORDER — HYDROCODONE-ACETAMINOPHEN 5-325 MG PO TABS: 1.0000 | ORAL_TABLET | Freq: Four times a day (QID) | ORAL | Status: DC | PRN

## 2013-08-03 NOTE — Telephone Encounter (Signed)
Please this prescription and mail it to the patient

## 2013-09-03 ENCOUNTER — Telehealth: Payer: Self-pay | Admitting: Family Medicine

## 2013-09-03 ENCOUNTER — Other Ambulatory Visit: Payer: Self-pay | Admitting: Family Medicine

## 2013-09-03 MED ORDER — HYDROCODONE-ACETAMINOPHEN 5-325 MG PO TABS: 1.0000 | ORAL_TABLET | Freq: Four times a day (QID) | ORAL | Status: DC | PRN

## 2013-09-03 NOTE — Telephone Encounter (Signed)
Please mail this patient his prescription for hydrocodone

## 2013-09-04 NOTE — Telephone Encounter (Signed)
Pt aware rx on the way

## 2013-10-06 ENCOUNTER — Other Ambulatory Visit: Payer: Self-pay | Admitting: Family Medicine

## 2013-10-06 ENCOUNTER — Telehealth: Payer: Self-pay | Admitting: Family Medicine

## 2013-10-06 MED ORDER — HYDROCODONE-ACETAMINOPHEN 5-325 MG PO TABS: 1.0000 | ORAL_TABLET | Freq: Four times a day (QID) | ORAL | Status: DC | PRN

## 2013-11-04 ENCOUNTER — Other Ambulatory Visit: Payer: Self-pay | Admitting: Family Medicine

## 2013-11-04 ENCOUNTER — Telehealth: Payer: Self-pay | Admitting: Family Medicine

## 2013-11-04 MED ORDER — HYDROCODONE-ACETAMINOPHEN 5-325 MG PO TABS: 1.0000 | ORAL_TABLET | Freq: Four times a day (QID) | ORAL | Status: DC | PRN

## 2013-11-04 NOTE — Telephone Encounter (Signed)
It is okay to mail this prescription to this patient as he has foot disabilities and problems with getting to the practice.

## 2013-11-04 NOTE — Telephone Encounter (Signed)
Spoke with patient concerning prescription for pain medication that has been printed and here for pick up.  Patient says script must be mailed to him. I explained that we were not allowed to mail pain medication scripts.  He said provider allowed this? Please advise.

## 2013-11-05 NOTE — Telephone Encounter (Signed)
Patient aware, script in the mail,per [facility].

## 2013-12-02 ENCOUNTER — Ambulatory Visit (INDEPENDENT_AMBULATORY_CARE_PROVIDER_SITE_OTHER): Payer: Medicare Other | Admitting: Family Medicine

## 2013-12-02 ENCOUNTER — Encounter: Payer: Self-pay | Admitting: Family Medicine

## 2013-12-02 VITALS — BP 156/78 | HR 60 | Temp 98.4°F | Ht 71.0 in | Wt 194.0 lb

## 2013-12-02 DIAGNOSIS — E559 Vitamin D deficiency, unspecified: Secondary | ICD-10-CM

## 2013-12-02 DIAGNOSIS — E118 Type 2 diabetes mellitus with unspecified complications: Secondary | ICD-10-CM

## 2013-12-02 DIAGNOSIS — E1149 Type 2 diabetes mellitus with other diabetic neurological complication: Secondary | ICD-10-CM

## 2013-12-02 DIAGNOSIS — E785 Hyperlipidemia, unspecified: Secondary | ICD-10-CM

## 2013-12-02 DIAGNOSIS — E11610 Type 2 diabetes mellitus with diabetic neuropathic arthropathy: Secondary | ICD-10-CM

## 2013-12-02 DIAGNOSIS — Z8546 Personal history of malignant neoplasm of prostate: Secondary | ICD-10-CM

## 2013-12-02 DIAGNOSIS — L84 Corns and callosities: Secondary | ICD-10-CM

## 2013-12-02 DIAGNOSIS — I10 Essential (primary) hypertension: Secondary | ICD-10-CM

## 2013-12-02 LAB — POCT CBC
Granulocyte percent: 72.2 %G (ref 37–80)
HCT, POC: 44 % (ref 43.5–53.7)
HEMOGLOBIN: 14.4 g/dL (ref 14.1–18.1)
Lymph, poc: 2 (ref 0.6–3.4)
MCH: 30.3 pg (ref 27–31.2)
MCHC: 32.7 g/dL (ref 31.8–35.4)
MCV: 92.7 fL (ref 80–97)
MPV: 7.7 fL (ref 0–99.8)
POC Granulocyte: 6.6 (ref 2–6.9)
POC LYMPH %: 22.5 % (ref 10–50)
Platelet Count, POC: 230 10*3/uL (ref 142–424)
RBC: 4.8 M/uL (ref 4.69–6.13)
RDW, POC: 12.7 %
WBC: 9.1 10*3/uL (ref 4.6–10.2)

## 2013-12-02 LAB — POCT GLYCOSYLATED HEMOGLOBIN (HGB A1C): HEMOGLOBIN A1C: 6.5

## 2013-12-02 LAB — POCT UA - MICROALBUMIN: Microalbumin Ur, POC: 100 mg/L

## 2013-12-02 MED ORDER — QUINAPRIL HCL 40 MG PO TABS: 40.0000 mg | ORAL_TABLET | Freq: Two times a day (BID) | ORAL | Status: DC

## 2013-12-02 MED ORDER — DIAZEPAM 10 MG PO TABS: 10.0000 mg | ORAL_TABLET | Freq: Two times a day (BID) | ORAL | Status: DC | PRN

## 2013-12-02 MED ORDER — HYDROCODONE-ACETAMINOPHEN 5-325 MG PO TABS
1.0000 | ORAL_TABLET | Freq: Four times a day (QID) | ORAL | Status: DC | PRN
Start: 2013-12-02 — End: 2014-01-14

## 2013-12-02 MED ORDER — SIMVASTATIN 40 MG PO TABS: 40.0000 mg | ORAL_TABLET | Freq: Every evening | ORAL | Status: DC

## 2013-12-02 MED ORDER — NIFEDIPINE ER OSMOTIC RELEASE 30 MG PO TB24: 30.0000 mg | ORAL_TABLET | Freq: Every day | ORAL | Status: DC

## 2013-12-02 NOTE — Addendum Note (Signed)
Addended by: Lyn Henri C on: 12/02/2013 12:00 PM   Modules accepted: Orders

## 2013-12-02 NOTE — Patient Instructions (Addendum)
Medicare Annual Wellness Visit  [facility] and the medical providers at [facility] strive to bring you the best medical care.  In doing so we not only want to address your current medical conditions and concerns but also to detect new conditions early and prevent illness, disease and health-related problems.    Medicare offers a yearly Wellness Visit which allows our clinical staff to assess your need for preventative services including immunizations, lifestyle education, counseling to decrease risk of preventable diseases and screening for fall risk and other medical concerns.    This visit is provided free of charge (no copay) for all Medicare recipients. The clinical pharmacists at [facility] have begun to conduct these Wellness Visits which will also include a thorough review of all your medications.    As you primary medical provider recommend that you make an appointment for your Annual Wellness Visit if you have not done so already this year.  You may set up this appointment before you leave today or you may call back ([phone]) and schedule an appointment.  Please make sure when you call that you mention that you are scheduling your Annual Wellness Visit with the clinical pharmacist so that the appointment may be made for the proper length of time.     Continue current medications. Continue good therapeutic lifestyle changes which include good diet and exercise. Fall precautions discussed with patient. If an FOBT was given today- please return it to our front desk. If you are over 33 years old - you may need Prevnar 60 or the adult Pneumonia vaccine.  Continue followup with orthopedic surgeon Get as much exercise as possible Drink as much water as possible Continue to monitor  feet and check blood sugars regularly

## 2013-12-02 NOTE — Addendum Note (Signed)
Addended by: Selmer Dominion on: 12/02/2013 12:08 PM   Modules accepted: Orders

## 2013-12-02 NOTE — Progress Notes (Signed)
Subjective:    Patient ID: Logan May, male    DOB: December 25, 1945, 69 y.o.   MRN: [id number]  HPI Pt here for follow up and management of chronic medical problems. The patient continues to see the orthopedist for his foot problems. He does not get a lot of physical activity due to his knees and Charcot foot.         Patient Active Problem List   Diagnosis Date Noted  . Status post amputation of toe of left foot 06/04/2013  . Charcot foot due to diabetes mellitus 06/04/2013  . Diabetes mellitus type 2, controlled, with complications 23/53/6144  . Hypertension 11/26/2012  . Vitamin D deficiency 11/26/2012  . History of prostate cancer 11/26/2012  . Hyperlipidemia 11/26/2012  . Generalized anxiety disorder 11/26/2012   Outpatient Encounter Prescriptions as of 12/02/2013  Medication Sig  . aspirin 81 MG tablet Take 162 mg by mouth daily.  . Cholecalciferol 2000 UNITS CAPS Take 2,000-4,000 Units by mouth daily. Takes 2000 units Monday- Friday , and 4000 units on Saturday and Sunday.  . diazepam (VALIUM) 10 MG tablet Take 10 mg by mouth every 12 (twelve) hours as needed for anxiety.  . fish oil-omega-3 fatty acids 1000 MG capsule Take 1 g by mouth every other day.   Marland Kitchen HYDROcodone-acetaminophen (NORCO/VICODIN) 5-325 MG per tablet Take 1 tablet by mouth every 6 (six) hours as needed for severe pain.  Marland Kitchen NIFEdipine (PROCARDIA-XL/ADALAT-CC/NIFEDICAL-XL) 30 MG 24 hr tablet Take 1 tablet (30 mg total) by mouth daily at 6 (six) AM.  . quinapril (ACCUPRIL) 40 MG tablet Take 1 tablet (40 mg total) by mouth 2 (two) times daily. Do not take within 2 hrs of taking doxycycline. (Space at least 2 hours from taking doxycycline).  . simvastatin (ZOCOR) 40 MG tablet Take 1 tablet (40 mg total) by mouth every evening.  . [DISCONTINUED] aspirin 325 MG tablet Take 81 mg by mouth daily. Takes 1/4th tablet once daily.  . [DISCONTINUED] diazepam (VALIUM) 10 MG tablet Take 0.5 tablets (5 mg total) by mouth  every 12 (twelve) hours as needed for anxiety.  . [DISCONTINUED] doxycycline (VIBRAMYCIN) 100 MG capsule Take 100 mg by mouth 2 (two) times daily.    Review of Systems  Constitutional: Negative.   HENT: Negative.   Eyes: Negative.   Respiratory: Negative.   Cardiovascular: Negative.   Gastrointestinal: Negative.   Endocrine: Negative.   Genitourinary: Negative.   Musculoskeletal: Negative.   Skin: Negative.   Allergic/Immunologic: Negative.   Neurological: Negative.   Hematological: Negative.   Psychiatric/Behavioral: Negative.        Objective:   Physical Exam  Nursing note and vitals reviewed. Constitutional: He is oriented to person, place, and time. He appears well-developed and well-nourished. No distress.  HENT:  Head: Normocephalic and atraumatic.  Right Ear: External ear normal.  Left Ear: External ear normal.  Nose: Nose normal.  Mouth/Throat: Oropharynx is clear and moist. No oropharyngeal exudate.  Eyes: Conjunctivae and EOM are normal. Pupils are equal, round, and reactive to light. Right eye exhibits no discharge. Left eye exhibits no discharge. No scleral icterus.  The patient has weak muscles on the left with that that is deviated inward, there is no sign of any infection or conjunctivitis   Neck: Normal range of motion. Neck supple. No thyromegaly present.  Cardiovascular: Normal rate, regular rhythm and normal heart sounds.   No murmur heard. The heart has a regular rate and rhythm at 72 per minute.  Pulmonary/Chest:  Effort normal and breath sounds normal. No respiratory distress. He has no wheezes. He has no rales. He exhibits no tenderness.  Abdominal: Soft. Bowel sounds are normal. He exhibits no mass. There is no tenderness. There is no rebound and no guarding.  Musculoskeletal: Normal range of motion. He exhibits no edema and no tenderness.  His walking and range of motion is affected by his Charcot feet. He has special shoes to wear.  Lymphadenopathy:      He has no cervical adenopathy.  Neurological: He is alert and oriented to person, place, and time. He has normal reflexes. No cranial nerve deficit.  Skin: Skin is warm and dry. No rash noted. No erythema. No pallor.  B. skin callus on the bottom of the left foot was partially excised  Psychiatric: He has a normal mood and affect. His behavior is normal. Judgment and thought content normal.   BP 156/78  Pulse 60  Temp(Src) 98.4 F (36.9 C) (Oral)  Ht _0  (1.803 m)  Wt 194 lb (87.998 kg)  BMI 27.07 kg/m2        Assessment & Plan:  1. Diabetes mellitus type 2, controlled, with complications - POCT CBC - POCT glycosylated hemoglobin (Hb A1C) - POCT UA - Microalbumin  2. History of prostate cancer - POCT CBC  3. Hyperlipidemia - POCT CBC - NMR, lipoprofile  4. Essential hypertension - POCT CBC - BMP8+EGFR - Hepatic function panel  5. Vitamin D deficiency - POCT CBC - Vit D  25 hydroxy (rtn osteoporosis monitoring)  6. Charcot foot due to diabetes mellitus  7. Callus of foot  Meds ordered this encounter  Medications  . aspirin 81 MG tablet    Sig: Take 162 mg by mouth daily.  Marland Kitchen DISCONTD: diazepam (VALIUM) 10 MG tablet    Sig: Take 10 mg by mouth every 12 (twelve) hours as needed for anxiety.  . simvastatin (ZOCOR) 40 MG tablet    Sig: Take 1 tablet (40 mg total) by mouth every evening.    Dispense:  90 tablet    Refill:  3  . quinapril (ACCUPRIL) 40 MG tablet    Sig: Take 1 tablet (40 mg total) by mouth 2 (two) times daily. Do not take within 2 hrs of taking doxycycline. (Space at least 2 hours from taking doxycycline).    Dispense:  180 tablet    Refill:  3  . NIFEdipine (PROCARDIA-XL/ADALAT-CC/NIFEDICAL-XL) 30 MG 24 hr tablet    Sig: Take 1 tablet (30 mg total) by mouth daily at 6 (six) AM.    Dispense:  90 tablet    Refill:  3  . diazepam (VALIUM) 10 MG tablet    Sig: Take 1 tablet (10 mg total) by mouth every 12 (twelve) hours as needed for  anxiety.    Dispense:  60 tablet    Refill:  2  . HYDROcodone-acetaminophen (NORCO/VICODIN) 5-325 MG per tablet    Sig: Take 1 tablet by mouth every 6 (six) hours as needed for severe pain.    Dispense:  120 tablet    Refill:  0   Patient Instructions                       Medicare Annual Wellness Visit  [facility] and the medical providers at [facility] strive to bring you the best medical care.  In doing so we not only want to address your current medical conditions and concerns but also to  detect new conditions early and prevent illness, disease and health-related problems.    Medicare offers a yearly Wellness Visit which allows our clinical staff to assess your need for preventative services including immunizations, lifestyle education, counseling to decrease risk of preventable diseases and screening for fall risk and other medical concerns.    This visit is provided free of charge (no copay) for all Medicare recipients. The clinical pharmacists at [facility] have begun to conduct these Wellness Visits which will also include a thorough review of all your medications.    As you primary medical provider recommend that you make an appointment for your Annual Wellness Visit if you have not done so already this year.  You may set up this appointment before you leave today or you may call back [phone]) and schedule an appointment.  Please make sure when you call that you mention that you are scheduling your Annual Wellness Visit with the clinical pharmacist so that the appointment may be made for the proper length of time.     Continue current medications. Continue good therapeutic lifestyle changes which include good diet and exercise. Fall precautions discussed with patient. If an FOBT was given today- please return it to our front desk. If you are over 42 years old - you may need Prevnar 54 or the adult Pneumonia vaccine.  Continue  followup with orthopedic surgeon Get as much exercise as possible Drink as much water as possible Continue to monitor  feet and check blood sugars regularly   Arrie Senate MD

## 2013-12-03 ENCOUNTER — Encounter: Payer: Self-pay | Admitting: *Deleted

## 2013-12-03 LAB — HEPATIC FUNCTION PANEL
ALBUMIN: 4.3 g/dL (ref 3.6–4.8)
ALK PHOS: 94 IU/L (ref 39–117)
ALT: 15 IU/L (ref 0–44)
AST: 19 IU/L (ref 0–40)
Bilirubin, Direct: 0.17 mg/dL (ref 0.00–0.40)
Total Bilirubin: 0.5 mg/dL (ref 0.0–1.2)
Total Protein: 7.1 g/dL (ref 6.0–8.5)

## 2013-12-03 LAB — NMR, LIPOPROFILE
Cholesterol: 153 mg/dL (ref 100–199)
HDL Cholesterol by NMR: 57 mg/dL (ref >39)
HDL PARTICLE NUMBER: 36.7 umol/L (ref >=30.5)
LDL Particle Number: 674 nmol/L (ref <1000)
LDL Size: 20.5 nm (ref >20.5)
LDLC SERPL CALC-MCNC: 74 mg/dL (ref 0–99)
LP-IR SCORE: 43 (ref <=45)
SMALL LDL PARTICLE NUMBER: 298 nmol/L (ref <=527)
Triglycerides by NMR: 111 mg/dL (ref 0–149)

## 2013-12-03 LAB — MICROALBUMIN, URINE: MICROALBUM., U, RANDOM: 36.8 ug/mL — AB (ref 0.0–17.0)

## 2013-12-03 LAB — BMP8+EGFR
BUN / CREAT RATIO: 11 (ref 10–22)
BUN: 11 mg/dL (ref 8–27)
CHLORIDE: 97 mmol/L (ref 97–108)
CO2: 25 mmol/L (ref 18–29)
Calcium: 9.4 mg/dL (ref 8.6–10.2)
Creatinine, Ser: 0.99 mg/dL (ref 0.76–1.27)
GFR calc non Af Amer: 77 mL/min/{1.73_m2} (ref >59)
GFR, EST AFRICAN AMERICAN: 89 mL/min/{1.73_m2} (ref >59)
Glucose: 186 mg/dL — ABNORMAL HIGH (ref 65–99)
Potassium: 5.3 mmol/L — ABNORMAL HIGH (ref 3.5–5.2)
SODIUM: 140 mmol/L (ref 134–144)

## 2013-12-03 LAB — VITAMIN D 25 HYDROXY (VIT D DEFICIENCY, FRACTURES): Vit D, 25-Hydroxy: 46.6 ng/mL (ref 30.0–100.0)

## 2013-12-03 LAB — FECAL OCCULT BLOOD, IMMUNOCHEMICAL: Fecal Occult Bld: NEGATIVE

## 2014-01-14 ENCOUNTER — Telehealth: Payer: Self-pay | Admitting: Family Medicine

## 2014-01-14 ENCOUNTER — Other Ambulatory Visit: Payer: Self-pay | Admitting: *Deleted

## 2014-01-14 MED ORDER — HYDROCODONE-ACETAMINOPHEN 5-325 MG PO TABS: 1.0000 | ORAL_TABLET | Freq: Four times a day (QID) | ORAL | Status: DC | PRN

## 2014-01-14 NOTE — Telephone Encounter (Signed)
Please refill this prescription and make sure that it is mailed to the patient

## 2014-01-14 NOTE — Telephone Encounter (Signed)
Aware ,script ready. 

## 2014-01-14 NOTE — Telephone Encounter (Signed)
Script mailed to pt per Dr. Laurance Flatten.

## 2014-02-16 ENCOUNTER — Telehealth: Payer: Self-pay | Admitting: Family Medicine

## 2014-02-16 MED ORDER — HYDROCODONE-ACETAMINOPHEN 5-325 MG PO TABS: 1.0000 | ORAL_TABLET | Freq: Four times a day (QID) | ORAL | Status: DC | PRN

## 2014-02-16 NOTE — Telephone Encounter (Signed)
Pt aware.

## 2014-02-16 NOTE — Telephone Encounter (Signed)
Please refill this prescription and mail it to the patient

## 2014-03-19 ENCOUNTER — Telehealth: Payer: Self-pay | Admitting: Family Medicine

## 2014-03-19 MED ORDER — HYDROCODONE-ACETAMINOPHEN 5-325 MG PO TABS
1.0000 | ORAL_TABLET | Freq: Four times a day (QID) | ORAL | Status: DC | PRN
Start: 2014-03-19 — End: 2014-04-21

## 2014-03-19 NOTE — Telephone Encounter (Signed)
Done

## 2014-04-20 ENCOUNTER — Telehealth: Payer: Self-pay | Admitting: Family Medicine

## 2014-04-20 NOTE — Telephone Encounter (Signed)
This is okay to refill 

## 2014-04-21 MED ORDER — HYDROCODONE-ACETAMINOPHEN 5-325 MG PO TABS: 1.0000 | ORAL_TABLET | Freq: Four times a day (QID) | ORAL | Status: DC | PRN

## 2014-04-21 NOTE — Telephone Encounter (Signed)
Will do!

## 2014-05-13 ENCOUNTER — Telehealth: Payer: Self-pay | Admitting: Family Medicine

## 2014-05-13 NOTE — Telephone Encounter (Signed)
Pt states that when he recently got his 2 BP pills -  Procardia and quinapril   (He feels light-headed and "whoozy")  That the pharm gave him a different brand than he usally gets He says that he hopes the pharmacy will fix this - but it may be a few days before they can.  He states that his BP usually runs around 145/ 65---- he is unable to check it and get accurate readings  because his machine is very old.  He has appt with [place] on 05/26/14.  He is wondering if until the [employer] comes in - can he 1/2 the tabs ????

## 2014-05-13 NOTE — Telephone Encounter (Signed)
Pt aware and will discuss with pharm tomorrow   He will only 1/2 it if he absolutey must.

## 2014-05-13 NOTE — Telephone Encounter (Signed)
Please have patient discuss these issues with the pharmacy. Make sure that he gets blood pressure readings at home and did not stop taking any medication at this point in time. He should confirm with the pharmacy that the correct milligrams were prescribed and that the correct medicines were prescribed. He may want to go by the pharmacy and have his blood pressure checked there and he can call us with that result.

## 2014-05-26 ENCOUNTER — Ambulatory Visit (INDEPENDENT_AMBULATORY_CARE_PROVIDER_SITE_OTHER): Payer: Medicare Other | Admitting: Family Medicine

## 2014-05-26 ENCOUNTER — Encounter: Payer: Self-pay | Admitting: Family Medicine

## 2014-05-26 VITALS — BP 166/88 | HR 92 | Temp 98.4°F | Ht 71.0 in | Wt 191.0 lb

## 2014-05-26 DIAGNOSIS — Z8546 Personal history of malignant neoplasm of prostate: Secondary | ICD-10-CM | POA: Diagnosis not present

## 2014-05-26 DIAGNOSIS — E559 Vitamin D deficiency, unspecified: Secondary | ICD-10-CM | POA: Diagnosis not present

## 2014-05-26 DIAGNOSIS — E785 Hyperlipidemia, unspecified: Secondary | ICD-10-CM | POA: Diagnosis not present

## 2014-05-26 DIAGNOSIS — M1712 Unilateral primary osteoarthritis, left knee: Secondary | ICD-10-CM

## 2014-05-26 DIAGNOSIS — L84 Corns and callosities: Secondary | ICD-10-CM

## 2014-05-26 DIAGNOSIS — E118 Type 2 diabetes mellitus with unspecified complications: Secondary | ICD-10-CM | POA: Diagnosis not present

## 2014-05-26 DIAGNOSIS — Z0000 Encounter for general adult medical examination without abnormal findings: Secondary | ICD-10-CM | POA: Diagnosis not present

## 2014-05-26 DIAGNOSIS — E11610 Type 2 diabetes mellitus with diabetic neuropathic arthropathy: Secondary | ICD-10-CM | POA: Diagnosis not present

## 2014-05-26 DIAGNOSIS — I10 Essential (primary) hypertension: Secondary | ICD-10-CM | POA: Diagnosis not present

## 2014-05-26 LAB — POCT GLYCOSYLATED HEMOGLOBIN (HGB A1C): HEMOGLOBIN A1C: 6.7

## 2014-05-26 MED ORDER — DIAZEPAM 10 MG PO TABS: 10.0000 mg | ORAL_TABLET | Freq: Two times a day (BID) | ORAL | Status: DC | PRN

## 2014-05-26 MED ORDER — QUINAPRIL HCL 40 MG PO TABS: 40.0000 mg | ORAL_TABLET | Freq: Two times a day (BID) | ORAL | Status: DC

## 2014-05-26 MED ORDER — NIFEDIPINE ER OSMOTIC RELEASE 30 MG PO TB24: 30.0000 mg | ORAL_TABLET | Freq: Every day | ORAL | Status: DC

## 2014-05-26 MED ORDER — SIMVASTATIN 40 MG PO TABS: 40.0000 mg | ORAL_TABLET | Freq: Every evening | ORAL | Status: DC

## 2014-05-26 MED ORDER — HYDROCODONE-ACETAMINOPHEN 5-325 MG PO TABS: 1.0000 | ORAL_TABLET | Freq: Four times a day (QID) | ORAL | Status: DC | PRN

## 2014-05-26 NOTE — Progress Notes (Signed)
Subjective:    Patient ID: Logan May, male    DOB: 06/07/1945, 69 y.o.   MRN: [id number]  HPI Pt here for follow up and management of chronic medical problems which include diabetes, hyperlipidemia, and hypertension. He is taking medications regularly. The patient has no specific complaint other than his left knee and he is seeing the orthopedist for this. He will be due to get his annual prostate exam today and he will get lab work. He is requesting refills on all of his medicines. The patient has been doing as well as usual at home. He unfortunately has not been able to follow his diet as closely because his wife has been sick. He does not check his blood sugars unless he goes to a friend's home where he can check them they are. He says his blood pressures have been running in the 150s over the low 80s. He indicates when he has checked his blood sugars they have been in the 130s early in the morning. He continues to see the orthopedic surgeon for his Charcot foot and his left knee and he only goes to see him as needed. He has minimal heartburn as some of his complaint. He will not do anything about the left knee unless it becomes much worse with pain.         Patient Active Problem List   Diagnosis Date Noted  . Status post amputation of toe of left foot 06/04/2013  . Charcot foot due to diabetes mellitus 06/04/2013  . Diabetes mellitus type 2, controlled, with complications 97/94/8016  . Hypertension 11/26/2012  . Vitamin D deficiency 11/26/2012  . History of prostate cancer 11/26/2012  . Hyperlipidemia 11/26/2012  . Generalized anxiety disorder 11/26/2012   Outpatient Encounter Prescriptions as of 05/26/2014  Medication Sig  . aspirin 81 MG tablet Take 162 mg by mouth daily.  . Cholecalciferol 2000 UNITS CAPS Take 2,000-4,000 Units by mouth daily. Takes 2000 units Monday- Friday , and 4000 units on Saturday and Sunday.  . diazepam (VALIUM) 10 MG tablet Take 1 tablet (10 mg  total) by mouth every 12 (twelve) hours as needed for anxiety.  . fish oil-omega-3 fatty acids 1000 MG capsule Take 1 g by mouth every other day.   Marland Kitchen HYDROcodone-acetaminophen (NORCO/VICODIN) 5-325 MG per tablet Take 1 tablet by mouth every 6 (six) hours as needed for severe pain.  Marland Kitchen NIFEdipine (PROCARDIA-XL/ADALAT-CC/NIFEDICAL-XL) 30 MG 24 hr tablet Take 1 tablet (30 mg total) by mouth daily at 6 (six) AM.  . quinapril (ACCUPRIL) 40 MG tablet Take 1 tablet (40 mg total) by mouth 2 (two) times daily.  . simvastatin (ZOCOR) 40 MG tablet Take 1 tablet (40 mg total) by mouth every evening.  . [DISCONTINUED] diazepam (VALIUM) 10 MG tablet Take 1 tablet (10 mg total) by mouth every 12 (twelve) hours as needed for anxiety.  . [DISCONTINUED] HYDROcodone-acetaminophen (NORCO/VICODIN) 5-325 MG per tablet Take 1 tablet by mouth every 6 (six) hours as needed for severe pain.  . [DISCONTINUED] NIFEdipine (PROCARDIA-XL/ADALAT-CC/NIFEDICAL-XL) 30 MG 24 hr tablet Take 1 tablet (30 mg total) by mouth daily at 6 (six) AM.  . [DISCONTINUED] quinapril (ACCUPRIL) 40 MG tablet Take 40 mg by mouth 2 (two) times daily.  . [DISCONTINUED] simvastatin (ZOCOR) 40 MG tablet Take 1 tablet (40 mg total) by mouth every evening.    Review of Systems  Constitutional: Negative.   HENT: Negative.   Eyes: Negative.   Respiratory: Negative.   Cardiovascular: Negative.  Gastrointestinal: Negative.   Endocrine: Negative.   Genitourinary: Negative.   Musculoskeletal: Positive for arthralgias (left knee and left foot (seen ortho for knee)).  Skin: Negative.   Allergic/Immunologic: Negative.   Neurological: Negative.   Hematological: Negative.   Psychiatric/Behavioral: Negative.        Objective:   Physical Exam  Constitutional: He is oriented to person, place, and time. He appears well-developed and well-nourished. No distress.  HENT:  Head: Normocephalic and atraumatic.  Right Ear: External ear normal.  Left Ear:  External ear normal.  Nose: Nose normal.  Mouth/Throat: Oropharynx is clear and moist. No oropharyngeal exudate.  Eyes: Conjunctivae and EOM are normal. Pupils are equal, round, and reactive to light. Right eye exhibits no discharge. Left eye exhibits no discharge. No scleral icterus.  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 He only has peripheral vision with this eye. There is no sign of any infection in either eye. He is due for an eye exam.  Neck: Normal range of motion. Neck supple. No thyromegaly present.  No carotid bruits or anterior cervical adenopathy  Cardiovascular: Normal rate, regular rhythm and normal heart sounds.  Exam reveals no gallop and no friction rub.   No murmur heard. Distal pulses were difficult to palpate The heart is regular at 72/m  Pulmonary/Chest: Effort normal and breath sounds normal. No respiratory distress. He has no wheezes. He has no rales. He exhibits no tenderness.  There is no axillary adenopathy  Abdominal: Soft. Bowel sounds are normal. He exhibits no mass. There is no tenderness. There is no rebound and no guarding.  There were no abdominal bruits or inguinal adenopathy  Genitourinary: Rectum normal and penis normal.  The prostate gland was not enlarged and there were no abnormal masses palpable. He has a history of seed implants. The rectal exam was negative. The external genitalia were normal and there were no inguinal hernias palpable.  Musculoskeletal: Normal range of motion. He exhibits no edema or tenderness.  The patient has bilateral Charcot foot and there is a large callus on the plantar surface of the left foot. He is absent toes on the left foot also.  Lymphadenopathy:    He has no cervical adenopathy.  Neurological: He is alert and oriented to person, place, and time. He has normal reflexes. No cranial nerve deficit.  Because of the absence of the great toe on the left foot he has a little bit more problem with balance and this  is also due to his Charcot foot to  Skin: Skin is warm and dry. No rash noted. No erythema. No pallor.  Psychiatric: He has a normal mood and affect. His behavior is normal. Judgment and thought content normal.  Nursing note and vitals reviewed.  BP 166/88 mmHg  Pulse 92  Temp(Src) 98.4 F (36.9 C) (Oral)  Ht $R'5\' 11"'Rt$  (1.803 m)  Wt 191 lb (86.637 kg)  BMI 26.65 kg/m2        Assessment & Plan:  1. Diabetes mellitus type 2, controlled, with complications -Try to monitor blood sugars more regularly -We will give you a monitor today and hopefully this will allow you to check some blood sugars at home more frequently -Do better with diet and try to get more exercise as tolerated - POCT glycosylated hemoglobin (Hb A1C) - CBC With differential/Platelet  2. Hyperlipidemia -Continue simvastatin and any adjustments in this medication will be determined after lab work is back - NMR, lipoprofile - CBC With  differential/Platelet  3. History of prostate cancer -Continue yearly exam and PSA - PSA, total and free - CBC With differential/Platelet  4. Essential hypertension -Do better with sodium intake, continue with quinapril 40 mg twice daily - BMP8+EGFR - Hepatic function panel - CBC With differential/Platelet  5. Vitamin D deficiency -Continue with current vitamin D 06-3998 units daily and we will discuss any change in this treatment when lab work is returned( - Vit D  25 hydroxy (rtn osteoporosis monitoring) - CBC With differential/Platelet  6. Charcot foot due to diabetes mellitus -Continue follow-up with orthopedist  7. Callus of foot -Continue follow-up with orthopedist  8. Primary osteoarthritis of left knee -Continue follow-up with orthopedist  9. Encounter for Medicare annual wellness exam  Meds ordered this encounter  Medications  . diazepam (VALIUM) 10 MG tablet    Sig: Take 1 tablet (10 mg total) by mouth every 12 (twelve) hours as needed for anxiety.     Dispense:  180 tablet    Refill:  0  . HYDROcodone-acetaminophen (NORCO/VICODIN) 5-325 MG per tablet    Sig: Take 1 tablet by mouth every 6 (six) hours as needed for severe pain.    Dispense:  120 tablet    Refill:  0  . NIFEdipine (PROCARDIA-XL/ADALAT-CC/NIFEDICAL-XL) 30 MG 24 hr tablet    Sig: Take 1 tablet (30 mg total) by mouth daily at 6 (six) AM.    Dispense:  90 tablet    Refill:  3  . quinapril (ACCUPRIL) 40 MG tablet    Sig: Take 1 tablet (40 mg total) by mouth 2 (two) times daily.    Dispense:  180 tablet    Refill:  3  . simvastatin (ZOCOR) 40 MG tablet    Sig: Take 1 tablet (40 mg total) by mouth every evening.    Dispense:  90 tablet    Refill:  3   Patient Instructions                       Medicare Annual Wellness Visit  [facility] and the medical providers at [facility] strive to bring you the best medical care.  In doing so we not only want to address your current medical conditions and concerns but also to detect new conditions early and prevent illness, disease and health-related problems.    Medicare offers a yearly Wellness Visit which allows our clinical staff to assess your need for preventative services including immunizations, lifestyle education, counseling to decrease risk of preventable diseases and screening for fall risk and other medical concerns.    This visit is provided free of charge (no copay) for all Medicare recipients. The clinical pharmacists at [facility] have begun to conduct these Wellness Visits which will also include a thorough review of all your medications.    As you primary medical provider recommend that you make an appointment for your Annual Wellness Visit if you have not done so already this year.  You may set up this appointment before you leave today or you may call back ([phone]) and schedule an appointment.  Please make sure when you call that you mention that you are scheduling your  Annual Wellness Visit with the clinical pharmacist so that the appointment may be made for the proper length of time.     Continue current medications. Continue good therapeutic lifestyle changes which include good diet and exercise. Fall precautions discussed with patient. If an FOBT was given today-  please return it to our front desk. If you are over 44 years old - you may need Prevnar 50 or the adult Pneumonia vaccine.  Flu Shots will be available at our office starting mid- September. Please call and schedule a FLU CLINIC APPOINTMENT.   Continue regular follow-up with orthopedist for Charcot foot and left knee Let us know when you are willing to go back and see the cardiologist because of your chronic atrial fibrillation The atrial fibrillation puts you at more risk for stroke and heart attack This winter continue to drink plenty of fluids, use saline nose spray and keep the house as cool as possible Check your blood sugars regularly and monitor your feet daily   Arrie Senate MD

## 2014-05-26 NOTE — Patient Instructions (Addendum)
Medicare Annual Wellness Visit  [facility] and the medical providers at [facility] strive to bring you the best medical care.  In doing so we not only want to address your current medical conditions and concerns but also to detect new conditions early and prevent illness, disease and health-related problems.    Medicare offers a yearly Wellness Visit which allows our clinical staff to assess your need for preventative services including immunizations, lifestyle education, counseling to decrease risk of preventable diseases and screening for fall risk and other medical concerns.    This visit is provided free of charge (no copay) for all Medicare recipients. The clinical pharmacists at [facility] have begun to conduct these Wellness Visits which will also include a thorough review of all your medications.    As you primary medical provider recommend that you make an appointment for your Annual Wellness Visit if you have not done so already this year.  You may set up this appointment before you leave today or you may call back ([phone]) and schedule an appointment.  Please make sure when you call that you mention that you are scheduling your Annual Wellness Visit with the clinical pharmacist so that the appointment may be made for the proper length of time.     Continue current medications. Continue good therapeutic lifestyle changes which include good diet and exercise. Fall precautions discussed with patient. If an FOBT was given today- please return it to our front desk. If you are over 65 years old - you may need Prevnar 80 or the adult Pneumonia vaccine.  Flu Shots will be available at our office starting mid- September. Please call and schedule a FLU CLINIC APPOINTMENT.   Continue regular follow-up with orthopedist for Charcot foot and left knee Let us know when you are willing to go back and see the cardiologist because  of your chronic atrial fibrillation The atrial fibrillation puts you at more risk for stroke and heart attack This winter continue to drink plenty of fluids, use saline nose spray and keep the house as cool as possible Check your blood sugars regularly and monitor your feet daily

## 2014-05-27 LAB — NMR, LIPOPROFILE
CHOLESTEROL: 153 mg/dL (ref 100–199)
HDL CHOLESTEROL BY NMR: 67 mg/dL (ref >39)
HDL PARTICLE NUMBER: 36.5 umol/L (ref >=30.5)
LDL Particle Number: 713 nmol/L (ref <1000)
LDL SIZE: 21.2 nm (ref >20.5)
LDL-C: 71 mg/dL (ref 0–99)
Small LDL Particle Number: <90 nmol/L (ref <=527)
TRIGLYCERIDES BY NMR: 73 mg/dL (ref 0–149)

## 2014-05-27 LAB — CBC WITH DIFFERENTIAL
BASOS: 0 %
Basophils Absolute: 0 10*3/uL (ref 0.0–0.2)
Eos: 2 %
Eosinophils Absolute: 0.1 10*3/uL (ref 0.0–0.4)
HCT: 43.5 % (ref 37.5–51.0)
Hemoglobin: 14.9 g/dL (ref 12.6–17.7)
IMMATURE GRANULOCYTES: 0 %
Immature Grans (Abs): 0 10*3/uL (ref 0.0–0.1)
LYMPHS: 25 %
Lymphocytes Absolute: 2 10*3/uL (ref 0.7–3.1)
MCH: 31.4 pg (ref 26.6–33.0)
MCHC: 34.3 g/dL (ref 31.5–35.7)
MCV: 92 fL (ref 79–97)
Monocytes Absolute: 0.6 10*3/uL (ref 0.1–0.9)
Monocytes: 7 %
NEUTROS ABS: 5.2 10*3/uL (ref 1.4–7.0)
Neutrophils Relative %: 66 %
PLATELETS: 256 10*3/uL (ref 150–379)
RBC: 4.75 x10E6/uL (ref 4.14–5.80)
RDW: 13.3 % (ref 12.3–15.4)
WBC: 7.9 10*3/uL (ref 3.4–10.8)

## 2014-05-27 LAB — HEPATIC FUNCTION PANEL
ALT: 16 IU/L (ref 0–44)
AST: 22 IU/L (ref 0–40)
Albumin: 4.4 g/dL (ref 3.5–4.8)
Alkaline Phosphatase: 92 IU/L (ref 39–117)
Bilirubin, Direct: 0.2 mg/dL (ref 0.00–0.40)
Total Bilirubin: 0.6 mg/dL (ref 0.0–1.2)
Total Protein: 7.1 g/dL (ref 6.0–8.5)

## 2014-05-27 LAB — PSA, TOTAL AND FREE
PSA FREE: 0.01 ng/mL
PSA, Free Pct: >10 %

## 2014-05-27 LAB — BMP8+EGFR
BUN/Creatinine Ratio: 13 (ref 10–22)
BUN: 12 mg/dL (ref 8–27)
CHLORIDE: 99 mmol/L (ref 97–108)
CO2: 25 mmol/L (ref 18–29)
Calcium: 9.1 mg/dL (ref 8.6–10.2)
Creatinine, Ser: 0.92 mg/dL (ref 0.76–1.27)
GFR calc Af Amer: 97 mL/min/{1.73_m2} (ref >59)
GFR, EST NON AFRICAN AMERICAN: 84 mL/min/{1.73_m2} (ref >59)
Glucose: 172 mg/dL — ABNORMAL HIGH (ref 65–99)
Potassium: 4.6 mmol/L (ref 3.5–5.2)
Sodium: 140 mmol/L (ref 134–144)

## 2014-05-27 LAB — VITAMIN D 25 HYDROXY (VIT D DEFICIENCY, FRACTURES): Vit D, 25-Hydroxy: 50.7 ng/mL (ref 30.0–100.0)

## 2014-06-22 ENCOUNTER — Telehealth: Payer: Self-pay | Admitting: Family Medicine

## 2014-06-22 MED ORDER — HYDROCODONE-ACETAMINOPHEN 5-325 MG PO TABS: 1.0000 | ORAL_TABLET | Freq: Four times a day (QID) | ORAL | Status: DC | PRN

## 2014-06-22 NOTE — Telephone Encounter (Signed)
Pt aware.

## 2014-07-21 ENCOUNTER — Telehealth: Payer: Self-pay | Admitting: Family Medicine

## 2014-07-21 MED ORDER — HYDROCODONE-ACETAMINOPHEN 5-325 MG PO TABS: 1.0000 | ORAL_TABLET | Freq: Four times a day (QID) | ORAL | Status: DC | PRN

## 2014-07-21 NOTE — Telephone Encounter (Signed)
Pt aware.

## 2014-08-20 ENCOUNTER — Telehealth: Payer: Self-pay | Admitting: Family Medicine

## 2014-08-20 MED ORDER — HYDROCODONE-ACETAMINOPHEN 5-325 MG PO TABS: 1.0000 | ORAL_TABLET | Freq: Four times a day (QID) | ORAL | Status: DC | PRN

## 2014-08-20 NOTE — Telephone Encounter (Signed)
Rx printed and mailed per Dr Laurance Flatten.

## 2014-08-20 NOTE — Telephone Encounter (Signed)
His refill his hydrocodone, present prescription and mail it to him

## 2014-08-20 NOTE — Telephone Encounter (Signed)
Patient requesting refill. 

## 2014-09-15 ENCOUNTER — Other Ambulatory Visit: Payer: Self-pay | Admitting: Family Medicine

## 2014-09-15 NOTE — Telephone Encounter (Signed)
Last filled and seen 05/26/14. Call into [facility] [phone]

## 2014-09-20 ENCOUNTER — Telehealth: Payer: Self-pay | Admitting: Family Medicine

## 2014-09-20 NOTE — Telephone Encounter (Signed)
This is okay to refill and mail a prescription to the patient

## 2014-09-21 MED ORDER — HYDROCODONE-ACETAMINOPHEN 5-325 MG PO TABS: 1.0000 | ORAL_TABLET | Freq: Four times a day (QID) | ORAL | Status: DC | PRN

## 2014-09-21 NOTE — Telephone Encounter (Signed)
rf done

## 2014-10-21 ENCOUNTER — Telehealth: Payer: Self-pay | Admitting: Family Medicine

## 2014-10-21 MED ORDER — HYDROCODONE-ACETAMINOPHEN 5-325 MG PO TABS: 1.0000 | ORAL_TABLET | Freq: Four times a day (QID) | ORAL | Status: DC | PRN

## 2014-10-21 NOTE — Telephone Encounter (Signed)
Please send a prescription to this patient who lives in [place].

## 2014-10-21 NOTE — Telephone Encounter (Signed)
pt aware 

## 2014-11-01 ENCOUNTER — Other Ambulatory Visit: Payer: Self-pay

## 2014-11-17 ENCOUNTER — Telehealth: Payer: Self-pay | Admitting: Pharmacist

## 2014-11-17 MED ORDER — HYDROCODONE-ACETAMINOPHEN 5-325 MG PO TABS: 1.0000 | ORAL_TABLET | Freq: Four times a day (QID) | ORAL | Status: DC | PRN

## 2014-11-17 NOTE — Telephone Encounter (Signed)
Patient has a cough which is sometimes productive and sometimes dry.   Recommended trial of Mucinex 400mg  BID for 5 to 7 days.  If cough worsens or if he has fever, then he should contact office.   Rx for pain medication printed for Dr Laurance Flatten to sign tomorrow and will have his nurse to mail to patient's home.

## 2014-11-18 ENCOUNTER — Other Ambulatory Visit: Payer: Self-pay | Admitting: Family Medicine

## 2014-11-18 NOTE — Telephone Encounter (Signed)
Per  D. Laurance Flatten, script for hydrocodone mailed to patient.

## 2014-11-24 ENCOUNTER — Telehealth: Payer: Self-pay | Admitting: Family Medicine

## 2014-11-25 NOTE — Telephone Encounter (Signed)
Spoke with pt and appt was changed

## 2014-12-01 ENCOUNTER — Ambulatory Visit: Payer: Medicare Other | Admitting: Family Medicine

## 2014-12-02 ENCOUNTER — Emergency Department (HOSPITAL_COMMUNITY): Payer: Medicare Other

## 2014-12-02 ENCOUNTER — Encounter (HOSPITAL_COMMUNITY): Payer: Self-pay | Admitting: Emergency Medicine

## 2014-12-02 ENCOUNTER — Inpatient Hospital Stay (HOSPITAL_COMMUNITY)
Admission: EM | Admit: 2014-12-02 | Discharge: 2014-12-19 | DRG: 853 | Disposition: A | Discharge status: Hospice | Payer: Medicare Other | Attending: Internal Medicine | Admitting: Internal Medicine

## 2014-12-02 DIAGNOSIS — I480 Paroxysmal atrial fibrillation: Secondary | ICD-10-CM | POA: Diagnosis not present

## 2014-12-02 DIAGNOSIS — Z452 Encounter for adjustment and management of vascular access device: Secondary | ICD-10-CM | POA: Diagnosis not present

## 2014-12-02 DIAGNOSIS — Z923 Personal history of irradiation: Secondary | ICD-10-CM | POA: Diagnosis not present

## 2014-12-02 DIAGNOSIS — Z79899 Other long term (current) drug therapy: Secondary | ICD-10-CM | POA: Diagnosis not present

## 2014-12-02 DIAGNOSIS — E785 Hyperlipidemia, unspecified: Secondary | ICD-10-CM | POA: Diagnosis not present

## 2014-12-02 DIAGNOSIS — R609 Edema, unspecified: Secondary | ICD-10-CM | POA: Diagnosis not present

## 2014-12-02 DIAGNOSIS — N390 Urinary tract infection, site not specified: Secondary | ICD-10-CM | POA: Diagnosis not present

## 2014-12-02 DIAGNOSIS — F10239 Alcohol dependence with withdrawal, unspecified: Secondary | ICD-10-CM | POA: Diagnosis not present

## 2014-12-02 DIAGNOSIS — B952 Enterococcus as the cause of diseases classified elsewhere: Secondary | ICD-10-CM | POA: Diagnosis present

## 2014-12-02 DIAGNOSIS — G9341 Metabolic encephalopathy: Secondary | ICD-10-CM | POA: Diagnosis not present

## 2014-12-02 DIAGNOSIS — Z66 Do not resuscitate: Secondary | ICD-10-CM | POA: Diagnosis not present

## 2014-12-02 DIAGNOSIS — R579 Shock, unspecified: Secondary | ICD-10-CM | POA: Diagnosis not present

## 2014-12-02 DIAGNOSIS — I4891 Unspecified atrial fibrillation: Secondary | ICD-10-CM

## 2014-12-02 DIAGNOSIS — Z89412 Acquired absence of left great toe: Secondary | ICD-10-CM

## 2014-12-02 DIAGNOSIS — J90 Pleural effusion, not elsewhere classified: Secondary | ICD-10-CM | POA: Diagnosis not present

## 2014-12-02 DIAGNOSIS — J9601 Acute respiratory failure with hypoxia: Secondary | ICD-10-CM

## 2014-12-02 DIAGNOSIS — F411 Generalized anxiety disorder: Secondary | ICD-10-CM | POA: Diagnosis present

## 2014-12-02 DIAGNOSIS — J449 Chronic obstructive pulmonary disease, unspecified: Secondary | ICD-10-CM | POA: Diagnosis present

## 2014-12-02 DIAGNOSIS — E43 Unspecified severe protein-calorie malnutrition: Secondary | ICD-10-CM | POA: Diagnosis not present

## 2014-12-02 DIAGNOSIS — J984 Other disorders of lung: Secondary | ICD-10-CM | POA: Diagnosis not present

## 2014-12-02 DIAGNOSIS — R091 Pleurisy: Secondary | ICD-10-CM | POA: Diagnosis not present

## 2014-12-02 DIAGNOSIS — J9811 Atelectasis: Secondary | ICD-10-CM | POA: Diagnosis not present

## 2014-12-02 DIAGNOSIS — E11610 Type 2 diabetes mellitus with diabetic neuropathic arthropathy: Secondary | ICD-10-CM | POA: Diagnosis not present

## 2014-12-02 DIAGNOSIS — Z0189 Encounter for other specified special examinations: Secondary | ICD-10-CM

## 2014-12-02 DIAGNOSIS — R627 Adult failure to thrive: Secondary | ICD-10-CM | POA: Diagnosis not present

## 2014-12-02 DIAGNOSIS — Z515 Encounter for palliative care: Secondary | ICD-10-CM

## 2014-12-02 DIAGNOSIS — R5381 Other malaise: Secondary | ICD-10-CM | POA: Diagnosis present

## 2014-12-02 DIAGNOSIS — E1142 Type 2 diabetes mellitus with diabetic polyneuropathy: Secondary | ICD-10-CM | POA: Diagnosis not present

## 2014-12-02 DIAGNOSIS — R339 Retention of urine, unspecified: Secondary | ICD-10-CM | POA: Diagnosis not present

## 2014-12-02 DIAGNOSIS — J9690 Respiratory failure, unspecified, unspecified whether with hypoxia or hypercapnia: Secondary | ICD-10-CM | POA: Diagnosis not present

## 2014-12-02 DIAGNOSIS — R404 Transient alteration of awareness: Secondary | ICD-10-CM | POA: Insufficient documentation

## 2014-12-02 DIAGNOSIS — J9600 Acute respiratory failure, unspecified whether with hypoxia or hypercapnia: Secondary | ICD-10-CM | POA: Diagnosis not present

## 2014-12-02 DIAGNOSIS — J439 Emphysema, unspecified: Secondary | ICD-10-CM | POA: Diagnosis not present

## 2014-12-02 DIAGNOSIS — R918 Other nonspecific abnormal finding of lung field: Secondary | ICD-10-CM | POA: Diagnosis not present

## 2014-12-02 DIAGNOSIS — J869 Pyothorax without fistula: Secondary | ICD-10-CM | POA: Diagnosis not present

## 2014-12-02 DIAGNOSIS — D62 Acute posthemorrhagic anemia: Secondary | ICD-10-CM | POA: Diagnosis not present

## 2014-12-02 DIAGNOSIS — I10 Essential (primary) hypertension: Secondary | ICD-10-CM | POA: Diagnosis present

## 2014-12-02 DIAGNOSIS — J690 Pneumonitis due to inhalation of food and vomit: Secondary | ICD-10-CM | POA: Diagnosis present

## 2014-12-02 DIAGNOSIS — R531 Weakness: Secondary | ICD-10-CM

## 2014-12-02 DIAGNOSIS — J189 Pneumonia, unspecified organism: Secondary | ICD-10-CM | POA: Diagnosis not present

## 2014-12-02 DIAGNOSIS — R338 Other retention of urine: Secondary | ICD-10-CM | POA: Diagnosis not present

## 2014-12-02 DIAGNOSIS — I2510 Atherosclerotic heart disease of native coronary artery without angina pectoris: Secondary | ICD-10-CM | POA: Diagnosis not present

## 2014-12-02 DIAGNOSIS — A419 Sepsis, unspecified organism: Secondary | ICD-10-CM | POA: Diagnosis not present

## 2014-12-02 DIAGNOSIS — I4581 Long QT syndrome: Secondary | ICD-10-CM | POA: Diagnosis present

## 2014-12-02 DIAGNOSIS — K828 Other specified diseases of gallbladder: Secondary | ICD-10-CM | POA: Diagnosis not present

## 2014-12-02 DIAGNOSIS — Z9181 History of falling: Secondary | ICD-10-CM

## 2014-12-02 DIAGNOSIS — F10231 Alcohol dependence with withdrawal delirium: Secondary | ICD-10-CM | POA: Diagnosis not present

## 2014-12-02 DIAGNOSIS — L8990 Pressure ulcer of unspecified site, unspecified stage: Secondary | ICD-10-CM | POA: Diagnosis present

## 2014-12-02 DIAGNOSIS — E876 Hypokalemia: Secondary | ICD-10-CM | POA: Diagnosis present

## 2014-12-02 DIAGNOSIS — Z9889 Other specified postprocedural states: Secondary | ICD-10-CM

## 2014-12-02 DIAGNOSIS — Z4682 Encounter for fitting and adjustment of non-vascular catheter: Secondary | ICD-10-CM | POA: Diagnosis not present

## 2014-12-02 DIAGNOSIS — I517 Cardiomegaly: Secondary | ICD-10-CM | POA: Diagnosis not present

## 2014-12-02 DIAGNOSIS — Z8546 Personal history of malignant neoplasm of prostate: Secondary | ICD-10-CM

## 2014-12-02 DIAGNOSIS — E119 Type 2 diabetes mellitus without complications: Secondary | ICD-10-CM | POA: Diagnosis not present

## 2014-12-02 DIAGNOSIS — R1310 Dysphagia, unspecified: Secondary | ICD-10-CM | POA: Diagnosis not present

## 2014-12-02 DIAGNOSIS — E559 Vitamin D deficiency, unspecified: Secondary | ICD-10-CM | POA: Diagnosis present

## 2014-12-02 DIAGNOSIS — Z7982 Long term (current) use of aspirin: Secondary | ICD-10-CM

## 2014-12-02 DIAGNOSIS — E118 Type 2 diabetes mellitus with unspecified complications: Secondary | ICD-10-CM

## 2014-12-02 DIAGNOSIS — N401 Enlarged prostate with lower urinary tract symptoms: Secondary | ICD-10-CM | POA: Diagnosis not present

## 2014-12-02 DIAGNOSIS — N359 Urethral stricture, unspecified: Secondary | ICD-10-CM | POA: Diagnosis not present

## 2014-12-02 DIAGNOSIS — R0602 Shortness of breath: Secondary | ICD-10-CM | POA: Diagnosis present

## 2014-12-02 DIAGNOSIS — R9431 Abnormal electrocardiogram [ECG] [EKG]: Secondary | ICD-10-CM | POA: Diagnosis present

## 2014-12-02 DIAGNOSIS — R296 Repeated falls: Secondary | ICD-10-CM | POA: Diagnosis present

## 2014-12-02 DIAGNOSIS — I700 Atherosclerosis of aorta: Secondary | ICD-10-CM | POA: Diagnosis not present

## 2014-12-02 DIAGNOSIS — J939 Pneumothorax, unspecified: Secondary | ICD-10-CM

## 2014-12-02 DIAGNOSIS — C61 Malignant neoplasm of prostate: Secondary | ICD-10-CM | POA: Diagnosis not present

## 2014-12-02 DIAGNOSIS — J188 Other pneumonia, unspecified organism: Secondary | ICD-10-CM | POA: Diagnosis not present

## 2014-12-02 DIAGNOSIS — Z4659 Encounter for fitting and adjustment of other gastrointestinal appliance and device: Secondary | ICD-10-CM

## 2014-12-02 DIAGNOSIS — I82409 Acute embolism and thrombosis of unspecified deep veins of unspecified lower extremity: Secondary | ICD-10-CM

## 2014-12-02 DIAGNOSIS — R4182 Altered mental status, unspecified: Secondary | ICD-10-CM | POA: Diagnosis not present

## 2014-12-02 DIAGNOSIS — R410 Disorientation, unspecified: Secondary | ICD-10-CM

## 2014-12-02 DIAGNOSIS — R6520 Severe sepsis without septic shock: Secondary | ICD-10-CM | POA: Diagnosis not present

## 2014-12-02 DIAGNOSIS — R0989 Other specified symptoms and signs involving the circulatory and respiratory systems: Secondary | ICD-10-CM | POA: Diagnosis not present

## 2014-12-02 DIAGNOSIS — R259 Unspecified abnormal involuntary movements: Secondary | ICD-10-CM | POA: Diagnosis not present

## 2014-12-02 DIAGNOSIS — J181 Lobar pneumonia, unspecified organism: Secondary | ICD-10-CM | POA: Diagnosis not present

## 2014-12-02 HISTORY — DX: Pneumonia, unspecified organism: J18.9

## 2014-12-02 LAB — CBC WITH DIFFERENTIAL/PLATELET
BASOS ABS: 0 10*3/uL (ref 0.0–0.1)
BASOS PCT: 0 % (ref 0–1)
Eosinophils Absolute: 0 10*3/uL (ref 0.0–0.7)
Eosinophils Relative: 0 % (ref 0–5)
HEMATOCRIT: 34.4 % — AB (ref 39.0–52.0)
Hemoglobin: 11.4 g/dL — ABNORMAL LOW (ref 13.0–17.0)
LYMPHS ABS: 1.1 10*3/uL (ref 0.7–4.0)
LYMPHS PCT: 4 % — AB (ref 12–46)
MCH: 29.4 pg (ref 26.0–34.0)
MCHC: 33.1 g/dL (ref 30.0–36.0)
MCV: 88.7 fL (ref 78.0–100.0)
MONO ABS: 1.1 10*3/uL — AB (ref 0.1–1.0)
Monocytes Relative: 4 % (ref 3–12)
Neutro Abs: 24.5 10*3/uL — ABNORMAL HIGH (ref 1.7–7.7)
Neutrophils Relative %: 92 % — ABNORMAL HIGH (ref 43–77)
Platelets: 443 10*3/uL — ABNORMAL HIGH (ref 150–400)
RBC: 3.88 MIL/uL — ABNORMAL LOW (ref 4.22–5.81)
RDW: 13.7 % (ref 11.5–15.5)
WBC: 26.7 10*3/uL — ABNORMAL HIGH (ref 4.0–10.5)

## 2014-12-02 LAB — COMPREHENSIVE METABOLIC PANEL
ALK PHOS: 93 U/L (ref 38–126)
ALT: 36 U/L (ref 17–63)
ANION GAP: 13 (ref 5–15)
AST: 43 U/L — AB (ref 15–41)
Albumin: 1.8 g/dL — ABNORMAL LOW (ref 3.5–5.0)
BILIRUBIN TOTAL: 1.6 mg/dL — AB (ref 0.3–1.2)
BUN: 41 mg/dL — AB (ref 6–20)
CO2: 26 mmol/L (ref 22–32)
CREATININE: 1.17 mg/dL (ref 0.61–1.24)
Calcium: 7.9 mg/dL — ABNORMAL LOW (ref 8.9–10.3)
Chloride: 96 mmol/L — ABNORMAL LOW (ref 101–111)
GFR calc Af Amer: >60 mL/min (ref >60)
GFR calc non Af Amer: >60 mL/min (ref >60)
GLUCOSE: 269 mg/dL — AB (ref 65–99)
Potassium: 3 mmol/L — ABNORMAL LOW (ref 3.5–5.1)
SODIUM: 135 mmol/L (ref 135–145)
Total Protein: 6 g/dL — ABNORMAL LOW (ref 6.5–8.1)

## 2014-12-02 LAB — I-STAT CG4 LACTIC ACID, ED
LACTIC ACID, VENOUS: 1.54 mmol/L (ref 0.5–2.0)
Lactic Acid, Venous: 1.49 mmol/L (ref 0.5–2.0)

## 2014-12-02 LAB — GLUCOSE, CAPILLARY
GLUCOSE-CAPILLARY: 196 mg/dL — AB (ref 65–99)
GLUCOSE-CAPILLARY: 145 mg/dL — AB (ref 65–99)

## 2014-12-02 LAB — STREP PNEUMONIAE URINARY ANTIGEN: Strep Pneumo Urinary Antigen: NEGATIVE

## 2014-12-02 MED ORDER — DEXTROSE 5 % IV SOLN
500.0000 mg | Freq: Once | INTRAVENOUS | Status: AC
  Administered 2014-12-02: 500 mg via INTRAVENOUS
  Filled 2014-12-02: qty 500

## 2014-12-02 MED ORDER — SIMVASTATIN 40 MG PO TABS
40.0000 mg | ORAL_TABLET | Freq: Every evening | ORAL | Status: DC
  Administered 2014-12-02: 40 mg via ORAL
  Filled 2014-12-02: qty 1

## 2014-12-02 MED ORDER — SODIUM CHLORIDE 0.9 % IV BOLUS (SEPSIS)
500.0000 mL | INTRAVENOUS | Status: AC
  Administered 2014-12-02: 500 mL via INTRAVENOUS

## 2014-12-02 MED ORDER — CETYLPYRIDINIUM CHLORIDE 0.05 % MT LIQD
7.0000 mL | Freq: Two times a day (BID) | OROMUCOSAL | Status: DC
  Administered 2014-12-02 – 2014-12-06 (×6): 7 mL via OROMUCOSAL

## 2014-12-02 MED ORDER — LEVOFLOXACIN IN D5W 750 MG/150ML IV SOLN
750.0000 mg | INTRAVENOUS | Status: DC
  Administered 2014-12-03 – 2014-12-04 (×2): 750 mg via INTRAVENOUS
  Filled 2014-12-02 (×2): qty 150

## 2014-12-02 MED ORDER — SODIUM CHLORIDE 0.9 % IV SOLN
INTRAVENOUS | Status: DC
  Administered 2014-12-02 – 2014-12-03 (×3): via INTRAVENOUS

## 2014-12-02 MED ORDER — VITAMIN D 1000 UNITS PO TABS
4000.0000 [IU] | ORAL_TABLET | ORAL | Status: DC
  Administered 2014-12-11 – 2014-12-12 (×2): 4000 [IU] via ORAL
  Filled 2014-12-02: qty 4

## 2014-12-02 MED ORDER — DIAZEPAM 5 MG PO TABS
10.0000 mg | ORAL_TABLET | Freq: Two times a day (BID) | ORAL | Status: DC | PRN
  Administered 2014-12-02 – 2014-12-04 (×2): 10 mg via ORAL
  Filled 2014-12-02 (×2): qty 2

## 2014-12-02 MED ORDER — SODIUM CHLORIDE 0.9 % IV BOLUS (SEPSIS)
1000.0000 mL | INTRAVENOUS | Status: AC
  Administered 2014-12-02: 1000 mL via INTRAVENOUS

## 2014-12-02 MED ORDER — ASPIRIN 81 MG PO CHEW: 81.0000 mg | CHEWABLE_TABLET | Freq: Every day | ORAL | Status: DC | PRN

## 2014-12-02 MED ORDER — CHOLECALCIFEROL 50 MCG (2000 UT) PO CAPS: 2000.0000 [IU] | ORAL_CAPSULE | Freq: Every day | ORAL | Status: DC

## 2014-12-02 MED ORDER — SODIUM CHLORIDE 0.9 % IV BOLUS (SEPSIS)
1000.0000 mL | Freq: Once | INTRAVENOUS | Status: AC
  Administered 2014-12-02: 1000 mL via INTRAVENOUS

## 2014-12-02 MED ORDER — QUINAPRIL HCL 10 MG PO TABS
40.0000 mg | ORAL_TABLET | Freq: Two times a day (BID) | ORAL | Status: DC
  Administered 2014-12-02 – 2014-12-03 (×2): 40 mg via ORAL
  Filled 2014-12-02 (×5): qty 4

## 2014-12-02 MED ORDER — DEXTROSE 5 % IV SOLN
1.0000 g | Freq: Once | INTRAVENOUS | Status: AC
  Administered 2014-12-02: 1 g via INTRAVENOUS
  Filled 2014-12-02: qty 10

## 2014-12-02 MED ORDER — INSULIN ASPART 100 UNIT/ML ~~LOC~~ SOLN
0.0000 [IU] | Freq: Three times a day (TID) | SUBCUTANEOUS | Status: DC
  Administered 2014-12-02 – 2014-12-03 (×2): 3 [IU] via SUBCUTANEOUS

## 2014-12-02 MED ORDER — ENSURE ENLIVE PO LIQD
237.0000 mL | Freq: Two times a day (BID) | ORAL | Status: DC
  Administered 2014-12-04: 237 mL via ORAL

## 2014-12-02 MED ORDER — NIFEDIPINE ER 30 MG PO TB24
30.0000 mg | ORAL_TABLET | Freq: Every day | ORAL | Status: DC
Start: 2014-12-03 — End: 2014-12-04
  Administered 2014-12-03: 30 mg via ORAL
  Filled 2014-12-02 (×3): qty 1

## 2014-12-02 MED ORDER — POTASSIUM CHLORIDE 10 MEQ/100ML IV SOLN
10.0000 meq | INTRAVENOUS | Status: AC
  Administered 2014-12-02 (×3): 10 meq via INTRAVENOUS
  Filled 2014-12-02 (×3): qty 100

## 2014-12-02 MED ORDER — INSULIN ASPART 100 UNIT/ML ~~LOC~~ SOLN: 0.0000 [IU] | Freq: Every day | SUBCUTANEOUS | Status: DC

## 2014-12-02 MED ORDER — VITAMIN D 1000 UNITS PO TABS
2000.0000 [IU] | ORAL_TABLET | ORAL | Status: DC
  Administered 2014-12-03 – 2014-12-13 (×5): 2000 [IU] via ORAL
  Filled 2014-12-02 (×8): qty 2

## 2014-12-02 NOTE — ED Notes (Signed)
Reclined pt to supine.

## 2014-12-02 NOTE — ED Notes (Signed)
Pt returned to supine per Dr Audie Pinto. Dr Audie Pinto at bedside.

## 2014-12-02 NOTE — ED Notes (Signed)
Attempted in and out cath to obtain UA, was unable to advance past prostate.

## 2014-12-02 NOTE — ED Notes (Signed)
Pt from home via [facility] with c/o falling x 3-4 weeks with reported dizziness and weakness.  Pt's wife states he has had a change in speech the last 3 days, pt states it's because his mouth is dry.  Initial BP 80 palpated given 500 mL NS increased to 102/70.  Pt in NAD, A&O.

## 2014-12-02 NOTE — ED Notes (Addendum)
Placed pt in trendelenburg. 

## 2014-12-02 NOTE — H&P (Signed)
History and Physical  BECKHEM ISADORE SEG:[id number] DOB: 12-Jul-1945 DOA: 12/02/2014  Referring physician: Dr Audie Pinto, ED physician PCP: Redge Gainer, MD   Chief Complaint: Cough, shortness of breath, weakness  HPI: FURQAN GOSSELIN is a 69 y.o. male  Diabetes type 2 with Charcot foot supposedly diet-controlled, hypertension, generalized anxiety disorder, history of atrial fibrillation not on anticoagulation presumably secondary to falls. Patient presents to the hospital with 3 weeks of worsening productive cough with purulent sputum. Guaifenesin has been helpful, but has not resolved his cough. He also has been having increased weakness with some mild dyspnea on exertion which is improved with rest. No other provoking her. In factors.   Review of Systems:   Pt complains of feeling chilled, weakness, back pain.  Pt denies any fevers, nausea, vomiting, headache, blurred vision, abdominal pain, diarrhea, constipation, headache, diplopia, rectal bleeding, melena.  Review of systems are otherwise negative  Past Medical History  Diagnosis Date  . BPH (benign prostatic hypertrophy)   . Prostate cancer     implants   . Vitamin D deficiency     takes Vit D daily  . History of ETT 1999    Dr, Caleen Essex  . Anxiety     takes Valium daily as needed  . Hyperlipidemia     takes Simvastatin nightly  . Hypertension     takes Quinapril and Procardia daily  . Headache(784.0)     occasionally  . Peripheral neuropathy   . Diabetes mellitus, type 2     but doesn't take any meds for it;diet controlled and exercise  . Diabetic Charcot's foot may 2002   Past Surgical History  Procedure Laterality Date  . Foot surgery  May 2002    Dr. Sharol Given   . Left foot casted 4-5 months    . Bilateral  eye laser surgery  2000  . Charcot foot left  12/15/01  . Laser eye surgery right  12/2002  . Eye surgery    . Tonsillectomy    . I&d extremity Left 09/05/2012    Procedure: IRRIGATION AND DEBRIDEMENT  EXTREMITY;  Surgeon: Newt Minion, MD;  Location: [facility];  Service: Orthopedics;  Laterality: Left;  Excision Charcot Collapse Left Foot and Base 5th Metatarsal, Antibiotic Beads  . Esophagogastroduodenoscopy      at least 35yrs ago  . Amputation Left 05/20/2013    Procedure: AMPUTATION DIGIT;  Surgeon: Newt Minion, MD;  Location: [facility];  Service: Orthopedics;  Laterality: Left;  Left Great Toe Amputation at [facility]   Social History:  reports that he has never smoked. He has never used smokeless tobacco. He reports that he drinks about 3.6 oz of alcohol per week. He reports that he does not use illicit drugs. Patient lives at home & is able to participate in activities of daily living  Allergies  Allergen Reactions  . Tetanus Toxoids Other (See Comments)    "Blacked out"   Patient unaware of family history  Prior to Admission medications   Medication Sig Start Date End Date Taking? Authorizing Provider  aspirin 81 MG tablet Take 81 mg by mouth daily as needed for pain.    Yes Historical Provider, MD  Cholecalciferol 2000 UNITS CAPS Take 2,000-4,000 Units by mouth daily. Takes 2000 units Monday- Friday , and 4000 units on Saturday and Sunday.   Yes Historical Provider, MD  diazepam (VALIUM) 10 MG tablet take 1 tablet by mouth every 12 hours if needed for anxiety 09/15/14  Yes Elenore Rota  Jennette Bill, MD  fish oil-omega-3 fatty acids 1000 MG capsule Take 1 g by mouth every other day.    Yes Historical Provider, MD  HYDROcodone-acetaminophen (NORCO/VICODIN) 5-325 MG per tablet Take 1 tablet by mouth every 6 (six) hours as needed for severe pain. 11/17/14  Yes Chipper Herb, MD  NIFEdipine (PROCARDIA-XL/ADALAT-CC/NIFEDICAL-XL) 30 MG 24 hr tablet Take 1 tablet (30 mg total) by mouth daily at 6 (six) AM. 05/26/14  Yes Chipper Herb, MD  quinapril (ACCUPRIL) 40 MG tablet Take 1 tablet (40 mg total) by mouth 2 (two) times daily. 05/26/14  Yes Chipper Herb, MD  simvastatin (ZOCOR) 40 MG tablet Take 1 tablet (40  mg total) by mouth every evening. 05/26/14  Yes Chipper Herb, MD    Physical Exam: BP 102/49 mmHg  Pulse 112  Temp(Src) 98.4 F (36.9 C) (Oral)  Resp 22  Ht 5\' 11"  (1.803 m)  Wt 81.647 kg (180 lb)  BMI 25.12 kg/m2  SpO2 91%  General: Elderly Caucasian male. Awake and alert and oriented x3. No acute cardiopulmonary distress.  Eyes: Pupils equal, round, reactive to light. Extraocular muscles are intact. Sclerae anicteric and noninjected.  ENT:  Moist mucosal membranes. No mucosal lesions.   Neck: Neck supple without lymphadenopathy. No carotid bruits. No masses palpated.  Cardiovascular: Regular rate with normal S1-S2 sounds. No murmurs, rubs, gallops auscultated. No JVD.  Respiratory: Diminished breath sounds on the right side. No wheezes or rales.  Abdomen: Soft, nontender, nondistended. Active bowel sounds. No masses or hepatosplenomegaly  Skin: Dry, warm to touch. 2+ dorsalis pedis and radial pulses. Musculoskeletal: No calf or leg pain. All major joints not erythematous nontender.  Psychiatric: Intact judgment and insight.  Neurologic: No focal neurological deficits. Cranial nerves II through XII are grossly intact.           Labs on Admission:  Basic Metabolic Panel:  Recent Labs Lab 12/02/14 0917  NA 135  K 3.0*  CL 96*  CO2 26  GLUCOSE 269*  BUN 41*  CREATININE 1.17  CALCIUM 7.9*   Liver Function Tests:  Recent Labs Lab 12/02/14 0917  AST 43*  ALT 36  ALKPHOS 93  BILITOT 1.6*  PROT 6.0*  ALBUMIN 1.8*   No results for input(s): LIPASE, AMYLASE in the last 168 hours. No results for input(s): AMMONIA in the last 168 hours. CBC:  Recent Labs Lab 12/02/14 0917  WBC 26.7*  NEUTROABS 24.5*  HGB 11.4*  HCT 34.4*  MCV 88.7  PLT 443*   Cardiac Enzymes: No results for input(s): CKTOTAL, CKMB, CKMBINDEX, TROPONINI in the last 168 hours.  BNP (last 3 results) No results for input(s): BNP in the last 8760 hours.  ProBNP (last 3 results) No results  for input(s): PROBNP in the last 8760 hours.  CBG: No results for input(s): GLUCAP in the last 168 hours.  Radiological Exams on Admission: Dg Chest 2 View  12/02/2014   CLINICAL DATA:  Altered level of consciousness.  EXAM: CHEST  2 VIEW  COMPARISON:  Sep 04, 2012.  FINDINGS: The heart size and mediastinal contours are within normal limits. No pneumothorax is noted. Left lung is clear. There is now noted diffuse opacification of the right lower lobe most consistent with pneumonia with associated pleural effusion. The visualized skeletal structures are unremarkable.  IMPRESSION: Diffuse opacification of the right lower lobe most consistent with pneumonia with associated pleural effusion. Follow-up radiographs are recommended.   Electronically Signed   By: Sabino Dick  Brooke Bonito, M.D.   On: 12/02/2014 10:57    EKG: Independently reviewed. Atrial fibrillation with ventricular rate of 116. Normal QRS. No ST changes. Prolonged QTC  Assessment/Plan Present on Admission:  . CAP (community acquired pneumonia) . Atrial fibrillation . Diabetes mellitus type 2, controlled, with complications  This patient was discussed with the ED physician, including pertinent vitals, physical exam findings, labs, and imaging.  We also discussed care given by the ED provider.  #1 community required pneumonia  Admit to telemetry  The patient received ceftriaxone and azithromycin for community-acquired pneumonia in the emergency department  Due to prolonged QTC, will change the patient to Levaquin which is less likely to further prolong the QTC  We'll obtain strep antigen by urine and Legionella by urine  We'll get HIV  Repeat CBC in the morning #2 atrial fibrillation  Continue telemetry monitoring  Patient not on chronic anticoagulation, likely due to history of falls  Chads 2 vasc score 3  Continue daily aspirin #3 diabetes 2  Sliding scale insulin while inpatient  Obtain hemoglobin A1c #4  weakness  PT referral  DVT prophylaxis: SCDs given patient history of fall  Consultants: None  Code Status: Full code  Family Communication: Wife in the room   Disposition Plan: Admit to telemetry   Truett Mainland, [facility] Pager [phone]

## 2014-12-02 NOTE — ED Notes (Signed)
Blood cultures collected prior to ABX start.

## 2014-12-03 ENCOUNTER — Inpatient Hospital Stay (HOSPITAL_COMMUNITY): Payer: Medicare Other

## 2014-12-03 DIAGNOSIS — A419 Sepsis, unspecified organism: Secondary | ICD-10-CM | POA: Diagnosis present

## 2014-12-03 DIAGNOSIS — I4581 Long QT syndrome: Secondary | ICD-10-CM

## 2014-12-03 DIAGNOSIS — J188 Other pneumonia, unspecified organism: Secondary | ICD-10-CM

## 2014-12-03 DIAGNOSIS — R6520 Severe sepsis without septic shock: Secondary | ICD-10-CM

## 2014-12-03 DIAGNOSIS — J189 Pneumonia, unspecified organism: Secondary | ICD-10-CM | POA: Diagnosis present

## 2014-12-03 DIAGNOSIS — L8990 Pressure ulcer of unspecified site, unspecified stage: Secondary | ICD-10-CM | POA: Diagnosis present

## 2014-12-03 DIAGNOSIS — J90 Pleural effusion, not elsewhere classified: Secondary | ICD-10-CM | POA: Diagnosis present

## 2014-12-03 DIAGNOSIS — R404 Transient alteration of awareness: Secondary | ICD-10-CM | POA: Insufficient documentation

## 2014-12-03 DIAGNOSIS — I480 Paroxysmal atrial fibrillation: Secondary | ICD-10-CM

## 2014-12-03 LAB — BASIC METABOLIC PANEL
Anion gap: 14 (ref 5–15)
BUN: 20 mg/dL (ref 6–20)
CALCIUM: 7.4 mg/dL — AB (ref 8.9–10.3)
CO2: 21 mmol/L — AB (ref 22–32)
CREATININE: 0.95 mg/dL (ref 0.61–1.24)
Chloride: 104 mmol/L (ref 101–111)
GFR calc non Af Amer: >60 mL/min (ref >60)
Glucose, Bld: 220 mg/dL — ABNORMAL HIGH (ref 65–99)
POTASSIUM: 3.2 mmol/L — AB (ref 3.5–5.1)
SODIUM: 139 mmol/L (ref 135–145)

## 2014-12-03 LAB — URINALYSIS, ROUTINE W REFLEX MICROSCOPIC
Bilirubin Urine: NEGATIVE
Glucose, UA: NEGATIVE mg/dL
Hgb urine dipstick: NEGATIVE
Ketones, ur: 40 mg/dL — AB
LEUKOCYTES UA: NEGATIVE
Nitrite: NEGATIVE
Protein, ur: NEGATIVE mg/dL
Specific Gravity, Urine: 1.016 (ref 1.005–1.030)
UROBILINOGEN UA: 1 mg/dL (ref 0.0–1.0)
pH: 5.5 (ref 5.0–8.0)

## 2014-12-03 LAB — CBC
HCT: 32.6 % — ABNORMAL LOW (ref 39.0–52.0)
HEMOGLOBIN: 10.6 g/dL — AB (ref 13.0–17.0)
MCH: 29.4 pg (ref 26.0–34.0)
MCHC: 32.5 g/dL (ref 30.0–36.0)
MCV: 90.3 fL (ref 78.0–100.0)
Platelets: 463 10*3/uL — ABNORMAL HIGH (ref 150–400)
RBC: 3.61 MIL/uL — ABNORMAL LOW (ref 4.22–5.81)
RDW: 14.2 % (ref 11.5–15.5)
WBC: 22.3 10*3/uL — ABNORMAL HIGH (ref 4.0–10.5)

## 2014-12-03 LAB — MAGNESIUM: MAGNESIUM: 1.5 mg/dL — AB (ref 1.7–2.4)

## 2014-12-03 LAB — LEGIONELLA ANTIGEN, URINE

## 2014-12-03 LAB — GLUCOSE, CAPILLARY
GLUCOSE-CAPILLARY: 169 mg/dL — AB (ref 65–99)
GLUCOSE-CAPILLARY: 66 mg/dL (ref 65–99)
Glucose-Capillary: 510 mg/dL — ABNORMAL HIGH (ref 65–99)
Glucose-Capillary: 220 mg/dL — ABNORMAL HIGH (ref 65–99)
Glucose-Capillary: 130 mg/dL — ABNORMAL HIGH (ref 65–99)

## 2014-12-03 LAB — CLOSTRIDIUM DIFFICILE BY PCR: Toxigenic C. Difficile by PCR: NEGATIVE

## 2014-12-03 LAB — HIV ANTIBODY (ROUTINE TESTING W REFLEX): HIV SCREEN 4TH GENERATION: NONREACTIVE

## 2014-12-03 MED ORDER — SODIUM CHLORIDE 0.9 % IV BOLUS (SEPSIS)
500.0000 mL | Freq: Once | INTRAVENOUS | Status: AC
  Administered 2014-12-03: 500 mL via INTRAVENOUS

## 2014-12-03 MED ORDER — INSULIN ASPART 100 UNIT/ML ~~LOC~~ SOLN
25.0000 [IU] | Freq: Once | SUBCUTANEOUS | Status: AC
  Administered 2014-12-03: 25 [IU] via SUBCUTANEOUS

## 2014-12-03 MED ORDER — INSULIN GLARGINE 100 UNIT/ML ~~LOC~~ SOLN
10.0000 [IU] | SUBCUTANEOUS | Status: DC
  Administered 2014-12-03 – 2014-12-04 (×2): 10 [IU] via SUBCUTANEOUS
  Filled 2014-12-03 (×4): qty 0.1

## 2014-12-03 MED ORDER — MAGNESIUM SULFATE 50 % IJ SOLN
3.0000 g | Freq: Once | INTRAVENOUS | Status: AC
  Administered 2014-12-03: 3 g via INTRAVENOUS
  Filled 2014-12-03: qty 6

## 2014-12-03 MED ORDER — HYDROCODONE-ACETAMINOPHEN 5-325 MG PO TABS: 1.0000 | ORAL_TABLET | Freq: Four times a day (QID) | ORAL | Status: DC | PRN

## 2014-12-03 MED ORDER — METOPROLOL TARTRATE 1 MG/ML IV SOLN
7.5000 mg | Freq: Four times a day (QID) | INTRAVENOUS | Status: DC
  Administered 2014-12-03 – 2014-12-04 (×4): 7.5 mg via INTRAVENOUS
  Filled 2014-12-03 (×4): qty 10

## 2014-12-03 MED ORDER — POTASSIUM CHLORIDE CRYS ER 20 MEQ PO TBCR
40.0000 meq | EXTENDED_RELEASE_TABLET | ORAL | Status: AC
  Administered 2014-12-03: 40 meq via ORAL
  Filled 2014-12-03: qty 2

## 2014-12-03 MED ORDER — INSULIN ASPART 100 UNIT/ML ~~LOC~~ SOLN
0.0000 [IU] | SUBCUTANEOUS | Status: DC
Start: 2014-12-04 — End: 2014-12-05
  Administered 2014-12-04: 3 [IU] via SUBCUTANEOUS
  Administered 2014-12-04 (×2): 4 [IU] via SUBCUTANEOUS
  Filled 2014-12-03: qty 1

## 2014-12-03 MED ORDER — LEVALBUTEROL HCL 0.63 MG/3ML IN NEBU
0.6300 mg | INHALATION_SOLUTION | Freq: Four times a day (QID) | RESPIRATORY_TRACT | Status: DC
  Administered 2014-12-03 (×2): 0.63 mg via RESPIRATORY_TRACT
  Filled 2014-12-03 (×2): qty 3

## 2014-12-03 MED ORDER — POTASSIUM CHLORIDE 10 MEQ/100ML IV SOLN
10.0000 meq | Freq: Once | INTRAVENOUS | Status: AC
  Administered 2014-12-03: 10 meq via INTRAVENOUS
  Filled 2014-12-03: qty 100

## 2014-12-03 MED ORDER — DEXTROSE-NACL 5-0.9 % IV SOLN
INTRAVENOUS | Status: DC
  Administered 2014-12-03: 23:00:00 via INTRAVENOUS

## 2014-12-03 MED ORDER — GUAIFENESIN ER 600 MG PO TB12
1200.0000 mg | ORAL_TABLET | Freq: Two times a day (BID) | ORAL | Status: DC
  Administered 2014-12-03 – 2014-12-06 (×5): 1200 mg via ORAL
  Filled 2014-12-03 (×12): qty 2

## 2014-12-03 MED ORDER — METOPROLOL TARTRATE 1 MG/ML IV SOLN
5.0000 mg | Freq: Once | INTRAVENOUS | Status: AC
  Administered 2014-12-03: 5 mg via INTRAVENOUS
  Filled 2014-12-03: qty 5

## 2014-12-03 MED ORDER — LEVALBUTEROL HCL 0.63 MG/3ML IN NEBU
0.6300 mg | INHALATION_SOLUTION | Freq: Three times a day (TID) | RESPIRATORY_TRACT | Status: DC
  Administered 2014-12-04 – 2014-12-05 (×4): 0.63 mg via RESPIRATORY_TRACT
  Filled 2014-12-03 (×5): qty 3

## 2014-12-03 MED ORDER — LEVALBUTEROL HCL 0.63 MG/3ML IN NEBU: 0.6300 mg | INHALATION_SOLUTION | RESPIRATORY_TRACT | Status: DC | PRN

## 2014-12-03 MED ORDER — INSULIN ASPART 100 UNIT/ML ~~LOC~~ SOLN: 0.0000 [IU] | Freq: Every day | SUBCUTANEOUS | Status: DC

## 2014-12-03 MED ORDER — METOPROLOL TARTRATE 1 MG/ML IV SOLN
5.0000 mg | Freq: Four times a day (QID) | INTRAVENOUS | Status: DC
  Administered 2014-12-03: 5 mg via INTRAVENOUS
  Filled 2014-12-03: qty 5

## 2014-12-03 MED ORDER — INSULIN ASPART 100 UNIT/ML ~~LOC~~ SOLN
0.0000 [IU] | Freq: Three times a day (TID) | SUBCUTANEOUS | Status: DC
  Administered 2014-12-03: 3 [IU] via SUBCUTANEOUS
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
## 2014-12-03 NOTE — Evaluation (Signed)
Clinical/Bedside Swallow Evaluation Patient Details  Name: Logan May MRN: [id number] Date of Birth: Aug 14, 1945  Today's Date: 12/03/2014 Time: SLP Start Time (ACUTE ONLY): 1110 SLP Stop Time (ACUTE ONLY): 1130 SLP Time Calculation (min) (ACUTE ONLY): 20 min  Past Medical History:  Past Medical History  Diagnosis Date  . BPH (benign prostatic hypertrophy)   . Prostate cancer     implants   . Vitamin D deficiency     takes Vit D daily  . History of ETT 1999    Dr, Caleen Essex  . Anxiety     takes Valium daily as needed  . Hyperlipidemia     takes Simvastatin nightly  . Hypertension     takes Quinapril and Procardia daily  . Headache(784.0)     occasionally  . Peripheral neuropathy   . Diabetes mellitus, type 2     but doesn't take any meds for it;diet controlled and exercise  . 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 Pneumonia 1960's?  . CAP (community acquired pneumonia) 12/02/2014   Past Surgical History:  Past Surgical History  Procedure Laterality Date  . Foot surgery  May 2002    Dr. Sharol Given   . Left foot casted 4-5 months    . Bilateral  eye laser surgery Bilateral 2000    "related to diabetes"  . Charcot foot left Left 12/15/01  . Laser eye surgery right  12/2002  . Eye surgery    . Tonsillectomy    . I&d extremity Left 09/05/2012    Procedure: IRRIGATION AND DEBRIDEMENT EXTREMITY;  Surgeon: Newt Minion, MD;  Location: [facility];  Service: Orthopedics;  Laterality: Left;  Excision Charcot Collapse Left Foot and Base 5th Metatarsal, Antibiotic Beads  . Esophagogastroduodenoscopy      at least 37yrs ago  . Amputation Left 05/20/2013    Procedure: AMPUTATION DIGIT;  Surgeon: Newt Minion, MD;  Location: [facility];  Service: Orthopedics;  Laterality: Left;  Left Great Toe Amputation at MTP   HPI:  Pt is a 69 y.o. male with PMH of Diabetes type 2 with Charcot foot supposedly diet-controlled, hypertension, generalized anxiety disorder, history of atrial fibrillation not on  anticoagulation presumably secondary to falls. Patient presents to the hospital with 3 weeks of worsening productive cough with purulent sputum. Guaifenesin has been helpful, but has not resolved his cough. He also has been having increased weakness with some mild dyspnea on exertion which is improved with rest. Also noted concerns of very dry mouth and poor oral hygiene. CXR 7/28 revealed diffuse opacification of RLL consistent with PNA with associated pleural effusion. Bedside swallow eval ordered to aide in ruling out aspiration.    Assessment / Plan / Recommendation Clinical Impression  Pt currently demonstrating multiple s/s concerning for aspiration, including wet vocal quality/ immediate throat clear following trials of thin liquid and puree consistencies. Additionally noted moderate amount of pocketing on L side following trials of solid and puree consistencies, pt cleared with multiple verbal cues. Pt currently confused and disoriented, and also presenting with a mild-moderate dysarthria. Given these findings along with CXR concerning for RLL PNA, pt is at high risk of aspiration at this time. Recommend that pt remain NPO with meds via alternative means pending objective evaluation via MBS to be completed this afternoon. Educated wife at bedside of findings; in agreement. Informed RN of concerns regarding cognitive status and dysarthria.    Aspiration Risk  Severe    Diet Recommendation NPO   Medication Administration:  Via alternative means    Other  Recommendations Oral Care Recommendations: Oral care QID Other Recommendations: Have oral suction available   Follow Up Recommendations       Frequency and Duration        Pertinent Vitals/Pain none    SLP Swallow Goals     Swallow Study Prior Functional Status       General Other Pertinent Information: Pt is a 69 y.o. male with PMH of Diabetes type 2 with Charcot foot supposedly diet-controlled, hypertension, generalized anxiety  disorder, history of atrial fibrillation not on anticoagulation presumably secondary to falls. Patient presents to the hospital with 3 weeks of worsening productive cough with purulent sputum. Guaifenesin has been helpful, but has not resolved his cough. He also has been having increased weakness with some mild dyspnea on exertion which is improved with rest. Also noted concerns of very dry mouth and poor oral hygiene. CXR 7/28 revealed diffuse opacification of RLL consistent with PNA with associated pleural effusion. Bedside swallow eval ordered to aide in ruling out aspiration.  Type of Study: Bedside swallow evaluation Diet Prior to this Study: Regular;Thin liquids Temperature Spikes Noted: No Respiratory Status: Supplemental O2 delivered via (comment) History of Recent Intubation: No Behavior/Cognition: Alert;Cooperative;Pleasant mood;Confused Oral Cavity - Dentition: Poor condition;Missing dentition Self-Feeding Abilities: Able to feed self Patient Positioning: Upright in bed Baseline Vocal Quality: Normal Volitional Cough: Strong Volitional Swallow: Able to elicit    Oral/Motor/Sensory Function Overall Oral Motor/Sensory Function: Impaired Labial ROM: Within Functional Limits Labial Symmetry: Within Functional Limits Labial Strength: Within Functional Limits Labial Sensation: Within Functional Limits Lingual ROM: Within Functional Limits Lingual Symmetry: Within Functional Limits Lingual Strength: Within Functional Limits Lingual Sensation: Reduced   Ice Chips Ice chips: Not tested   Thin Liquid Thin Liquid: Impaired Presentation: Cup;Straw Pharyngeal  Phase Impairments: Throat Clearing - Immediate;Wet Vocal Quality;Multiple swallows    Nectar Thick Nectar Thick Liquid: Not tested   Honey Thick Honey Thick Liquid: Not tested   Puree Puree: Impaired Presentation: Spoon;Self Fed Oral Phase Impairments: Reduced lingual movement/coordination;Poor awareness of bolus Oral Phase  Functional Implications: Left lateral sulci pocketing;Oral residue Pharyngeal Phase Impairments: Wet Vocal Quality   Solid   GO    Solid: Impaired Presentation: Self Fed Oral Phase Impairments: Poor awareness of bolus Oral Phase Functional Implications: Oral residue Pharyngeal Phase Impairments: Multiple swallows       Gilmore Laroche, Kamarii Carton K, MA, CCC-SLP 12/03/2014,11:36 AM [phone]

## 2014-12-03 NOTE — Progress Notes (Signed)
SLP Cancellation Note  Patient Details Name: AMERICUS PERKEY MRN: [id number] DOB: 1945-06-29   Cancelled treatment:       Reason Eval/Treat Not Completed: Medical issues which prohibited therapy. Pt unable to complete MBS this pm due to medical issues. SLP will f/u for readiness tomorrow.    Launa Goedken, Katherene Ponto 12/03/2014, 1:09 PM

## 2014-12-03 NOTE — Progress Notes (Signed)
Utilization Review completed. Kathelyn Gombos RN BSN CM 
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
## 2014-12-03 NOTE — Progress Notes (Signed)
[facility] PROGRESS NOTE  Logan May CVE:[id number] DOB: [birth year] DOA: 12/02/2014 PCP: Redge Gainer, MD  Assessment/Plan: #1 Severe sepsis secondary to healthcare associated pneumonia  Patient is tachycardic and in A. fib with a leukocytosis. Patient has been pancultured. Patient is afebrile. Continue IV Levaquin, Mucinex, nebulizer treatments.  #2 healthcare associated pneumonia Sputum Gram stain and cultures pending. Urine Legionella antigen is negative. Urine pneumococcus antigen is negative. Continue oxygen, nebulizer treatments, IV Levaquin, Mucinex.  #3 right pleural effusion/possible parapneumonic effusion Likely a parapneumonic effusion. Patient has been pancultured cultures are pending. Continue empiric IV Levaquin. Consult with pulmonary for further evaluation and management. May need a diagnostic and therapeutic thoracentesis. Follow.  #4 A. fib with RVR CHADS2Vasc SCORE 3 Likely secondary to problems #1 and 2. Patient with heart rate ranging from 55-141. Patient on Procardia at home. We'll place on Levaquin 5 mg IV every 6 hours. If no significant improvement in heart rate will need to be placed on a Cardizem drip. Patient not on anticoagulation likely secondary to history of falls.  #5 diabetes mellitus type 2 Hemoglobin A1c pending. CBGs have ranged from 130- 510. We'll place on Lantus 10 units daily. Sliding scale insulin.  #6 Debility PT/OT. Will likely need SNF.  #7 QTc prolongation Will monitor. Repeat EKG in 1-2 days.  #8 prophylaxis SCDs for DVT prophylaxis.  Code Status: Full Family Communication: updated patient and wife at bedside. Disposition Plan: Remain inpatient   Consultants:  PCCM: Dr Titus Mould 12/03/14  Procedures:  CXR 12/02/14  Antibiotics:  IV Levaquin 12/02/14  HPI/Subjective: Patent with no complaints. Patient working with PT.   Objective: Filed Vitals:   12/03/14 0539  BP: 113/63  Pulse: 130  Temp: 98.3 F (36.8  C)  Resp: 18    Intake/Output Summary (Last 24 hours) at 12/03/14 1236 Last data filed at 12/03/14 0700  Gross per 24 hour  Intake   3165 ml  Output   1150 ml  Net   2015 ml   Filed Weights   12/02/14 0911 12/02/14 1504  Weight: 81.647 kg (180 lb) 82.555 kg (182 lb)    Exam:   General:  NAD  Cardiovascular: RRR  Respiratory: Decreased BS in R base.Some coarse scattered BS.  Abdomen: Soft/NT/ND/+BS  Musculoskeletal: No c/c/e  Data Reviewed: Basic Metabolic Panel:  Recent Labs Lab 12/02/14 0917 12/03/14 0618  NA 135 139  K 3.0* 3.2*  CL 96* 104  CO2 26 21*  GLUCOSE 269* 220*  BUN 41* 20  CREATININE 1.17 0.95  CALCIUM 7.9* 7.4*  MG  --  1.5*   Liver Function Tests:  Recent Labs Lab 12/02/14 0917  AST 43*  ALT 36  ALKPHOS 93  BILITOT 1.6*  PROT 6.0*  ALBUMIN 1.8*   No results for input(s): LIPASE, AMYLASE in the last 168 hours. No results for input(s): AMMONIA in the last 168 hours. CBC:  Recent Labs Lab 12/02/14 0917 12/03/14 0618  WBC 26.7* 22.3*  NEUTROABS 24.5*  --   HGB 11.4* 10.6*  HCT 34.4* 32.6*  MCV 88.7 90.3  PLT 443* 463*   Cardiac Enzymes: No results for input(s): CKTOTAL, CKMB, CKMBINDEX, TROPONINI in the last 168 hours. BNP (last 3 results) No results for input(s): BNP in the last 8760 hours.  ProBNP (last 3 results) No results for input(s): PROBNP in the last 8760 hours.  CBG:  Recent Labs Lab 12/02/14 1651 12/02/14 2219 12/03/14 0758  GLUCAP 196* 145* 169*    Recent Results (from the  past 240 hour(s))  Culture, blood (routine x 2)     Status: None (Preliminary result)   Collection Time: 12/02/14 12:14 PM  Result Value Ref Range Status   Specimen Description BLOOD LEFT ANTECUBITAL  Final   Special Requests BOTTLES DRAWN AEROBIC AND ANAEROBIC 5CC  Final   Culture PENDING  Incomplete   Report Status PENDING  Incomplete     Studies: Dg Chest 2 View  12/02/2014   CLINICAL DATA:  Altered level of  consciousness.  EXAM: CHEST  2 VIEW  COMPARISON:  Sep 04, 2012.  FINDINGS: The heart size and mediastinal contours are within normal limits. No pneumothorax is noted. Left lung is clear. There is now noted diffuse opacification of the right lower lobe most consistent with pneumonia with associated pleural effusion. The visualized skeletal structures are unremarkable.  IMPRESSION: Diffuse opacification of the right lower lobe most consistent with pneumonia with associated pleural effusion. Follow-up radiographs are recommended.   Electronically Signed   By: Marijo Conception, M.D.   On: 12/02/2014 10:57    Scheduled Meds: . antiseptic oral rinse  7 mL Mouth Rinse BID  . cholecalciferol  2,000 Units Oral Once per day on Mon Tue Wed Thu Fri  . [START ON 12/04/2014] cholecalciferol  4,000 Units Oral Once per day on Sun Sat  . feeding supplement (ENSURE ENLIVE)  237 mL Oral BID BM  . guaiFENesin  1,200 mg Oral BID  . insulin aspart  0-15 Units Subcutaneous TID WC  . insulin aspart  0-5 Units Subcutaneous QHS  . levalbuterol  0.63 mg Nebulization Q6H  . levofloxacin (LEVAQUIN) IV  750 mg Intravenous Q24H  . magnesium sulfate 1 - 4 g bolus IVPB  3 g Intravenous Once  . NIFEdipine  30 mg Oral Q0600  . potassium chloride  40 mEq Oral Q4H  . quinapril  40 mg Oral BID  . simvastatin  40 mg Oral QPM   Continuous Infusions: . sodium chloride 125 mL/hr at 12/03/14 5465    Principal Problem:   Severe sepsis Active Problems:   Diabetes mellitus type 2, controlled, with complications   Hypertension   Hyperlipidemia   Generalized anxiety disorder   CAP (community acquired pneumonia)   Atrial fibrillation   Prolonged Q-T interval on ECG   Weakness    Time spent: 35 mins    [facility] MD [facility] Pager [phone]. If 7PM-7AM, please contact night-coverage at [email or web address], password [email or web address] 12/03/2014, 12:36 PM  LOS: 1 day
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
## 2014-12-03 NOTE — Consult Note (Signed)
Name: Logan May MRN: [id number] DOB: March 22, 1946    ADMISSION DATE:  12/02/2014 CONSULTATION DATE:  12/03/2014  REFERRING MD :  Dr. Grand[facility] S[facility]  CHIEF COMPLAINT:  DOE  BRIEF PATIENT DESCRIPTION: 69 year old male admitted 7/28 with complaints of productive cough and SOB x 3 weeks. Admitted 7/28 for R sided CAP with associated effusion on CXR. PCCM consulted for evaluation of effusion.   SIGNIFICANT EVENTS  7/29 admitted  STUDIES:    HISTORY OF PRESENT ILLNESS:  69 year old male with PMH as below, which includes HTN, and DM 2 with Charcot's foot. Dm is controlled with diet. He was in his USOH up until about 3 weeks ago. USOH allows him to be very independent, drive, etc. About 3 weeks ago he started having falls intermittently, and would be unable to get back up. Wife would have to call family members to get him up. He refused going to ED. About a week ago she started noticing DOE and a very congested, wet cough, productive for clear sputum. 7/27 he fell, and she was unable to find help to stand him back up, so she called EMS. In ED CXR was performed and noted nearly complete opacification of the right lung field concerning for pneumonia with effusion. He was admitted to the hospitalist service and was treated for CAP with levofloxacin. Hospital course thus far complicated by AF RVR. PCCM consulted for effusion evaluation.   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 Prior to Admission medications   Medication Sig Start Date End Date Taking? Authorizing Provider  aspirin 81 MG tablet Take 81 mg by mouth daily as needed for pain.    Yes Historical Provider, MD  Cholecalciferol 2000 UNITS CAPS Take 2,000-4,000 Units by mouth daily. Takes 2000 units Monday- Friday , and 4000 units on Saturday and Sunday.   Yes Historical Provider, MD  diazepam (VALIUM) 10 MG tablet take 1 tablet by mouth every 12 hours if needed for anxiety 09/15/14  Yes Chipper Herb, MD  fish oil-omega-3 fatty acids 1000 MG capsule Take 1 g by mouth every other day.    Yes Historical Provider, MD  HYDROcodone-acetaminophen (NORCO/VICODIN) 5-325 MG per tablet Take 1 tablet by mouth every 6 (six) hours as needed for severe pain. 11/17/14  Yes Chipper Herb, MD  NIFEdipine (PROCARDIA-XL/ADALAT-CC/NIFEDICAL-XL) 30 MG 24 hr tablet Take 1 tablet (30 mg total) by mouth daily at 6 (six) AM. 05/26/14  Yes Chipper Herb, MD  quinapril (ACCUPRIL) 40 MG tablet Take 1 tablet (40 mg total) by mouth 2 (two) times daily. 05/26/14  Yes Chipper Herb, MD  simvastatin (ZOCOR) 40 MG tablet Take 1 tablet (40 mg total) by mouth every evening. 05/26/14  Yes Chipper Herb, MD   Allergies  Allergen Reactions  . Tetanus Toxoids Other (See Comments)    "Blacked out"    FAMILY HISTORY:  family history is not on file. SOCIAL HISTORY:  reports that he has never smoked.  He has never used smokeless tobacco. He reports that he drinks about 25.2 oz of alcohol per week. He reports that he does not use illicit drugs.  REVIEW OF SYSTEMS:   Bolds are positive  Constitutional: weight loss, gain, night sweats, Fevers, chills, fatigue .  HEENT: headaches, Sore throat, sneezing, nasal congestion, post nasal drip, Difficulty swallowing, Tooth/dental problems, visual complaints visual changes, ear  ache CV:  chest pain, radiates: ,Orthopnea, PND, swelling in lower extremities, dizziness, palpitations, syncope.  GI  heartburn, indigestion, abdominal pain, nausea, vomiting, diarrhea, change in bowel habits, loss of appetite, bloody stools.  Resp: cough, productive: , hemoptysis, dyspnea, chest pain, pleuritic R posterior.  Skin: rash or itching or icterus GU: dysuria, change in color of urine, urgency or frequency. flank pain, hematuria  MS: joint pain or swelling. decreased range of motion  Psych: change in mood or affect. depression or anxiety.  Neuro: difficulty with speech, weakness, numbness, ataxia    SUBJECTIVE:   VITAL SIGNS: Temp:  [98.2 F (36.8 C)-99.5 F (37.5 C)] 98.3 F (36.8 C) (07/29 0539) Pulse Rate:  [71-130] 130 (07/29 0539) Resp:  [17-20] 18 (07/29 0539) BP: (108-113)/(53-72) 113/63 mmHg (07/29 0539) SpO2:  [83 %-100 %] 100 % (07/29 0904) Weight:  [82.555 kg (182 lb)] 82.555 kg (182 lb) (07/28 1504)  PHYSICAL EXAMINATION: General:  Chronically ill appearing male, agitated Neuro:  Agitated and somewhat confused, but will answer questions appropriately with re-direction. Non-focal HEENT:  [place]/AT, PERRL, no JVD Cardiovascular:  IRIR, tachy Lungs:  Diminished R lung fields Abdomen:  Soft, non-tender, non-distended Musculoskeletal:  No acute deformity or ROM limitation Skin:  Grossly intact   Recent Labs Lab 12/02/14 0917 12/03/14 0618  NA 135 139  K 3.0* 3.2*  CL 96* 104  CO2 26 21*  BUN 41* 20  CREATININE 1.17 0.95  GLUCOSE 269* 220*    Recent Labs Lab 12/02/14 0917 12/03/14 0618  HGB 11.4* 10.6*  HCT 34.4* 32.6*  WBC 26.7* 22.3*  PLT 443* 463*   Dg Chest 2 View  12/02/2014   CLINICAL DATA:  Altered level of consciousness.  EXAM: CHEST  2 VIEW  COMPARISON:  Sep 04, 2012.  FINDINGS: The heart size and mediastinal contours are within normal limits. No pneumothorax is noted. Left lung is clear. There is now noted diffuse opacification of the  right lower lobe most consistent with pneumonia with associated pleural effusion. The visualized skeletal structures are unremarkable.  IMPRESSION: Diffuse opacification of the right lower lobe most consistent with pneumonia with associated pleural effusion. Follow-up radiographs are recommended.   Electronically Signed   By: Marijo Conception, M.D.   On: 12/02/2014 10:57    ASSESSMENT / PLAN:  R moderate pleural effusion, suspect parapneumonic vs hemothorax in setting recent falls, also consider transudate due to CHF in setting AF RVR - Will evaluate with bedside US and perform thoracentesis if indicated - If so, will follow pleural fluid studies - Follow CXR - Consider Echo  CAP - Continue CAP coverage with levaquin - Follow cultures 7/28 >>> - Supplemental O2 as needed to maintain SpO2 > 92%  Attending to follow  Georgann Housekeeper, AGACNP-BC [facility] Pager [phone] or [phone]  12/03/2014 2:56 PM   STAFF NOTE: Linwood Dibbles, MD FACP have personally reviewed patient's available data, including medical history, events of note, physical examination and test results as part of my evaluation. I have discussed with resident/NP and other care providers such as pharmacist, RN and  RRT. In addition, I personally evaluated patient and elicited key findings of: sleeping now, no distress, pcxr with effusion likely, concern as presentation is toxic granulation, leukocytosis, CAP, at risk empyema, also with fall, would rec CT chest dry  To evaluate loculation  / h-units for discern blood vs pleural fluid prior to [place] / thora attempts, continued levofloxacin, low threshold vanc if declines or spikes, if thora done and bloody would need HCT sent on specimen I updated wife at bedside   Lavon Paganini. Titus Mould, MD, [place] Pgr: [facility] 12/03/2014 5:29 PM

## 2014-12-04 ENCOUNTER — Encounter (HOSPITAL_COMMUNITY): Payer: Self-pay | Admitting: Thoracic Surgery (Cardiothoracic Vascular Surgery)

## 2014-12-04 ENCOUNTER — Inpatient Hospital Stay (HOSPITAL_COMMUNITY): Payer: Medicare Other

## 2014-12-04 DIAGNOSIS — J189 Pneumonia, unspecified organism: Secondary | ICD-10-CM

## 2014-12-04 DIAGNOSIS — R6520 Severe sepsis without septic shock: Secondary | ICD-10-CM

## 2014-12-04 DIAGNOSIS — Z9181 History of falling: Secondary | ICD-10-CM

## 2014-12-04 DIAGNOSIS — J869 Pyothorax without fistula: Secondary | ICD-10-CM | POA: Diagnosis present

## 2014-12-04 DIAGNOSIS — J90 Pleural effusion, not elsewhere classified: Secondary | ICD-10-CM

## 2014-12-04 DIAGNOSIS — A419 Sepsis, unspecified organism: Principal | ICD-10-CM

## 2014-12-04 LAB — URINALYSIS, ROUTINE W REFLEX MICROSCOPIC
Bilirubin Urine: NEGATIVE
Glucose, UA: NEGATIVE mg/dL
HGB URINE DIPSTICK: NEGATIVE
Ketones, ur: 15 mg/dL — AB
Leukocytes, UA: NEGATIVE
Nitrite: NEGATIVE
PROTEIN: NEGATIVE mg/dL
Specific Gravity, Urine: 1.013 (ref 1.005–1.030)
Urobilinogen, UA: 1 mg/dL (ref 0.0–1.0)
pH: 5.5 (ref 5.0–8.0)

## 2014-12-04 LAB — BLOOD GAS, ARTERIAL
ACID-BASE EXCESS: 3.8 mmol/L — AB (ref 0.0–2.0)
Bicarbonate: 26.6 mEq/L — ABNORMAL HIGH (ref 20.0–24.0)
Drawn by: 276051
FIO2: 0.21
O2 Saturation: 88.1 %
PCO2 ART: 32 mmHg — AB (ref 35.0–45.0)
PH ART: 7.531 — AB (ref 7.350–7.450)
Patient temperature: 98.6
TCO2: 27.6 mmol/L (ref 0–100)
pO2, Arterial: 52 mmHg — ABNORMAL LOW (ref 80.0–100.0)

## 2014-12-04 LAB — GLUCOSE, CAPILLARY
GLUCOSE-CAPILLARY: 154 mg/dL — AB (ref 65–99)
GLUCOSE-CAPILLARY: 135 mg/dL — AB (ref 65–99)
GLUCOSE-CAPILLARY: 91 mg/dL (ref 65–99)
GLUCOSE-CAPILLARY: 119 mg/dL — AB (ref 65–99)
Glucose-Capillary: 111 mg/dL — ABNORMAL HIGH (ref 65–99)
Glucose-Capillary: 122 mg/dL — ABNORMAL HIGH (ref 65–99)
Glucose-Capillary: 153 mg/dL — ABNORMAL HIGH (ref 65–99)

## 2014-12-04 LAB — CBC
HCT: 31.4 % — ABNORMAL LOW (ref 39.0–52.0)
HEMOGLOBIN: 10.1 g/dL — AB (ref 13.0–17.0)
MCH: 28.7 pg (ref 26.0–34.0)
MCHC: 32.2 g/dL (ref 30.0–36.0)
MCV: 89.2 fL (ref 78.0–100.0)
Platelets: 424 10*3/uL — ABNORMAL HIGH (ref 150–400)
RBC: 3.52 MIL/uL — ABNORMAL LOW (ref 4.22–5.81)
RDW: 14.5 % (ref 11.5–15.5)
WBC: 17.8 10*3/uL — ABNORMAL HIGH (ref 4.0–10.5)

## 2014-12-04 LAB — CBC WITH DIFFERENTIAL/PLATELET
Basophils Absolute: 0 10*3/uL (ref 0.0–0.1)
Basophils Relative: 0 % (ref 0–1)
EOS PCT: 0 % (ref 0–5)
Eosinophils Absolute: 0 10*3/uL (ref 0.0–0.7)
HCT: 33.8 % — ABNORMAL LOW (ref 39.0–52.0)
Hemoglobin: 10.9 g/dL — ABNORMAL LOW (ref 13.0–17.0)
LYMPHS ABS: 0.9 10*3/uL (ref 0.7–4.0)
Lymphocytes Relative: 5 % — ABNORMAL LOW (ref 12–46)
MCH: 28.8 pg (ref 26.0–34.0)
MCHC: 32.2 g/dL (ref 30.0–36.0)
MCV: 89.2 fL (ref 78.0–100.0)
Monocytes Absolute: 0.8 10*3/uL (ref 0.1–1.0)
Monocytes Relative: 4 % (ref 3–12)
Neutro Abs: 16.9 10*3/uL — ABNORMAL HIGH (ref 1.7–7.7)
Neutrophils Relative %: 91 % — ABNORMAL HIGH (ref 43–77)
Platelets: 445 10*3/uL — ABNORMAL HIGH (ref 150–400)
RBC: 3.79 MIL/uL — AB (ref 4.22–5.81)
RDW: 14.6 % (ref 11.5–15.5)
WBC: 18.5 10*3/uL — ABNORMAL HIGH (ref 4.0–10.5)

## 2014-12-04 LAB — BODY FLUID CELL COUNT WITH DIFFERENTIAL
Eos, Fluid: UNDETERMINED %
Lymphs, Fluid: UNDETERMINED %
MONOCYTE-MACROPHAGE-SEROUS FLUID: UNDETERMINED % (ref 50–90)
Neutrophil Count, Fluid: UNDETERMINED % (ref 0–25)
WBC FLUID: 34500 uL — AB (ref 0–1000)

## 2014-12-04 LAB — BASIC METABOLIC PANEL
Anion gap: 5 (ref 5–15)
BUN: 9 mg/dL (ref 6–20)
CALCIUM: 7.6 mg/dL — AB (ref 8.9–10.3)
CO2: 27 mmol/L (ref 22–32)
Chloride: 109 mmol/L (ref 101–111)
Creatinine, Ser: 0.63 mg/dL (ref 0.61–1.24)
GFR calc non Af Amer: >60 mL/min (ref >60)
Glucose, Bld: 166 mg/dL — ABNORMAL HIGH (ref 65–99)
POTASSIUM: 3 mmol/L — AB (ref 3.5–5.1)
Sodium: 141 mmol/L (ref 135–145)

## 2014-12-04 LAB — PROTIME-INR
INR: 1.36 (ref 0.00–1.49)
Prothrombin Time: 16.9 seconds — ABNORMAL HIGH (ref 11.6–15.2)

## 2014-12-04 LAB — LACTATE DEHYDROGENASE: LDH: 389 U/L — ABNORMAL HIGH (ref 98–192)

## 2014-12-04 LAB — PROTEIN, BODY FLUID: Total protein, fluid: 3.7 g/dL

## 2014-12-04 LAB — GRAM STAIN

## 2014-12-04 LAB — HEMOGLOBIN A1C
Hgb A1c MFr Bld: 6.9 % — ABNORMAL HIGH (ref 4.8–5.6)
Hgb A1c MFr Bld: 6.8 % — ABNORMAL HIGH (ref 4.8–5.6)
MEAN PLASMA GLUCOSE: 151 mg/dL
MEAN PLASMA GLUCOSE: 148 mg/dL

## 2014-12-04 LAB — PROTEIN, TOTAL: Total Protein: 5.5 g/dL — ABNORMAL LOW (ref 6.5–8.1)

## 2014-12-04 LAB — LACTATE DEHYDROGENASE, PLEURAL OR PERITONEAL FLUID: LD, Fluid: 6124 U/L — ABNORMAL HIGH (ref 3–23)

## 2014-12-04 LAB — ABO/RH: ABO/RH(D): O POS

## 2014-12-04 LAB — MAGNESIUM: Magnesium: 1.4 mg/dL — ABNORMAL LOW (ref 1.7–2.4)

## 2014-12-04 LAB — APTT: aPTT: 35 seconds (ref 24–37)

## 2014-12-04 LAB — TROPONIN I: TROPONIN I: 0.04 ng/mL — AB (ref <0.031)

## 2014-12-04 MED ORDER — POTASSIUM CHLORIDE 2 MEQ/ML IV SOLN
INTRAVENOUS | Status: DC
  Administered 2014-12-04 – 2014-12-05 (×2): via INTRAVENOUS
  Filled 2014-12-04 (×7): qty 1000

## 2014-12-04 MED ORDER — POTASSIUM CHLORIDE CRYS ER 20 MEQ PO TBCR: 40.0000 meq | EXTENDED_RELEASE_TABLET | ORAL | Status: DC

## 2014-12-04 MED ORDER — VANCOMYCIN HCL IN DEXTROSE 1-5 GM/200ML-% IV SOLN
1000.0000 mg | Freq: Two times a day (BID) | INTRAVENOUS | Status: DC
Start: 2014-12-05 — End: 2014-12-09
  Administered 2014-12-05 – 2014-12-08 (×8): 1000 mg via INTRAVENOUS
  Filled 2014-12-04 (×10): qty 200

## 2014-12-04 MED ORDER — VANCOMYCIN HCL IN DEXTROSE 1-5 GM/200ML-% IV SOLN
1000.0000 mg | INTRAVENOUS | Status: DC
  Filled 2014-12-04: qty 200

## 2014-12-04 MED ORDER — POTASSIUM CHLORIDE 10 MEQ/100ML IV SOLN
10.0000 meq | Freq: Once | INTRAVENOUS | Status: AC
  Administered 2014-12-04: 10 meq via INTRAVENOUS
  Filled 2014-12-04: qty 100

## 2014-12-04 MED ORDER — POTASSIUM CHLORIDE 10 MEQ/100ML IV SOLN
10.0000 meq | INTRAVENOUS | Status: AC
  Administered 2014-12-04 (×4): 10 meq via INTRAVENOUS
  Filled 2014-12-04 (×4): qty 100

## 2014-12-04 MED ORDER — VANCOMYCIN HCL 10 G IV SOLR
1500.0000 mg | Freq: Once | INTRAVENOUS | Status: AC
  Administered 2014-12-04: 1500 mg via INTRAVENOUS
  Filled 2014-12-04: qty 1500

## 2014-12-04 MED ORDER — METOPROLOL TARTRATE 1 MG/ML IV SOLN
10.0000 mg | Freq: Four times a day (QID) | INTRAVENOUS | Status: DC
  Administered 2014-12-04 – 2014-12-05 (×3): 10 mg via INTRAVENOUS
  Filled 2014-12-04 (×2): qty 10

## 2014-12-04 MED ORDER — PIPERACILLIN-TAZOBACTAM 3.375 G IVPB
3.3750 g | Freq: Three times a day (TID) | INTRAVENOUS | Status: DC
  Administered 2014-12-04 – 2014-12-10 (×17): 3.375 g via INTRAVENOUS
  Filled 2014-12-04 (×21): qty 50

## 2014-12-04 MED ORDER — STARCH (THICKENING) PO POWD
ORAL | Status: DC | PRN
  Filled 2014-12-04: qty 227

## 2014-12-04 MED ORDER — MAGNESIUM SULFATE 4 GM/100ML IV SOLN
4.0000 g | Freq: Once | INTRAVENOUS | Status: AC
  Administered 2014-12-04: 4 g via INTRAVENOUS
  Filled 2014-12-04: qty 100

## 2014-12-04 NOTE — Progress Notes (Signed)
CRITICAL VALUE ALERT  Critical value received:  Pleural fluid - Abundand WBC PMN and mononuclear; Gram positive and negative cocci in chains and clusters  Date of notification:  12/04/2014  Time of notification:  19:40  Critical value read back:Yes.    Nurse who received alert:  Alphonsus Sias  MD notified (1st page):  Dr. Rachelle Hora  Time of first page:  19:42  MD notified (2nd page):  Time of second page:  Responding MD:  Dr. Rachelle Hora  Time MD responded:  19:45

## 2014-12-04 NOTE — Progress Notes (Signed)
I was asked to see Logan May in neurological consultation. As soon as I introduced myself, patient became hostile, did not reply to my questions in a friendly manner, and after a brief conversation finally demanded that I leave the room " because I don't need you". Wife is at the bedside and said that " he is very unhappy right now" and concurred with patient in terms of not pursuing this evaluation at this moment. I told Mr Blank that will be more than happy to see him again if he changes his mind.  Dorian Pod, MD [facility]

## 2014-12-04 NOTE — ED Provider Notes (Signed)
CSN: [id number]     Arrival date & time 12/02/14  6270 History   First MD Initiated Contact with Patient 12/02/14 [phone]     Chief Complaint  Patient presents with  . Failure To Thrive      HPI  Expand All Collapse All   Pt from home via [facility] with c/o falling x 3-4 weeks with reported dizziness and weakness. Pt's wife states he has had a change in speech the last 3 days, pt states it's because his mouth is dry. Initial BP 80 palpated given 500 mL NS increased to 102/70.        Past Medical History  Diagnosis Date  . BPH (benign prostatic hypertrophy)   . Prostate cancer     implants   . Vitamin D deficiency     takes Vit D daily  . History of ETT 1999    Dr, Caleen Essex  . Anxiety     takes Valium daily as needed  . Hyperlipidemia     takes Simvastatin nightly  . Hypertension     takes Quinapril and Procardia daily  . Headache(784.0)     occasionally  . Peripheral neuropathy   . Diabetes mellitus, type 2     but doesn't take any meds for it;diet controlled and exercise  . 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 Pneumonia 1960's?  . CAP (community acquired pneumonia) 12/02/2014   Past Surgical History  Procedure Laterality Date  . Foot surgery  May 2002    Dr. Sharol Given   . Left foot casted 4-5 months    . Bilateral  eye laser surgery Bilateral 2000    "related to diabetes"  . Charcot foot left Left 12/15/01  . Laser eye surgery right  12/2002  . Eye surgery    . Tonsillectomy    . I&d extremity Left 09/05/2012    Procedure: IRRIGATION AND DEBRIDEMENT EXTREMITY;  Surgeon: Newt Minion, MD;  Location: [facility];  Service: Orthopedics;  Laterality: Left;  Excision Charcot Collapse Left Foot and Base 5th Metatarsal, Antibiotic Beads  . Esophagogastroduodenoscopy      at least 18yrs ago  . Amputation Left 05/20/2013    Procedure: AMPUTATION DIGIT;  Surgeon: Newt Minion, MD;  Location: [facility];  Service: Orthopedics;  Laterality: Left;  Left Great Toe Amputation at [facility]   History  reviewed. No pertinent family history. History  Substance Use Topics  . Smoking status: Never Smoker   . Smokeless tobacco: Never Used  . Alcohol Use: 25.2 oz/week    42 Cans of beer per week     Comment: 12/02/2014 "maybe 6 pack of beer/day; on days when he's feeling good"    Review of Systems    Allergies  Tetanus toxoids  Home Medications   Prior to Admission medications   Medication Sig Start Date End Date Taking? Authorizing Provider  aspirin 81 MG tablet Take 81 mg by mouth daily as needed for pain.    Yes Historical Provider, MD  Cholecalciferol 2000 UNITS CAPS Take 2,000-4,000 Units by mouth daily. Takes 2000 units Monday- Friday , and 4000 units on Saturday and Sunday.   Yes Historical Provider, MD  diazepam (VALIUM) 10 MG tablet take 1 tablet by mouth every 12 hours if needed for anxiety 09/15/14  Yes Chipper Herb, MD  fish oil-omega-3 fatty acids 1000 MG capsule Take 1 g by mouth every other day.    Yes Historical Provider, MD  HYDROcodone-acetaminophen (NORCO/VICODIN) 5-325 MG per tablet  Take 1 tablet by mouth every 6 (six) hours as needed for severe pain. 11/17/14  Yes Chipper Herb, MD  NIFEdipine (PROCARDIA-XL/ADALAT-CC/NIFEDICAL-XL) 30 MG 24 hr tablet Take 1 tablet (30 mg total) by mouth daily at 6 (six) AM. 05/26/14  Yes Chipper Herb, MD  quinapril (ACCUPRIL) 40 MG tablet Take 1 tablet (40 mg total) by mouth 2 (two) times daily. 05/26/14  Yes Chipper Herb, MD  simvastatin (ZOCOR) 40 MG tablet Take 1 tablet (40 mg total) by mouth every evening. 05/26/14  Yes Chipper Herb, MD   BP 117/65 mmHg  Pulse 114  Temp(Src) 98.4 F (36.9 C) (Axillary)  Resp 20  Ht 5\' 11"  (1.803 m)  Wt 182 lb (82.555 kg)  BMI 25.40 kg/m2  SpO2 100% Physical Exam  Constitutional: He appears well-developed and well-nourished. He appears distressed.  HENT:  Head: Normocephalic and atraumatic.  Eyes: Pupils are equal, round, and reactive to light.  Neck: Normal range of motion.   Cardiovascular: Normal rate and intact distal pulses.   Pulmonary/Chest: No respiratory distress. He has wheezes.  Abdominal: Normal appearance. He exhibits no distension. There is no tenderness. There is no rebound.  Musculoskeletal: Normal range of motion.  Neurological: He is alert. No cranial nerve deficit.  Skin: Skin is warm and dry. No rash noted.  Nursing note and vitals reviewed.   ED Course  Procedures (including critical care time) CRITICAL CARE Performed by: Leonard Schwartz L Total critical care time: 30 min Critical care time was exclusive of separately billable procedures and treating other patients. Critical care was necessary to treat or prevent imminent or life-threatening deterioration. Critical care was time spent personally by me on the following activities: development of treatment plan with patient and/or surrogate as well as nursing, discussions with consultants, evaluation of patient's response to treatment, examination of patient, obtaining history from patient or surrogate, ordering and performing treatments and interventions, ordering and review of laboratory studies, ordering and review of radiographic studies, pulse oximetry and re-evaluation of patient's condition.  Medications  diazepam (VALIUM) tablet 10 mg (10 mg Oral Given 12/02/14 2005)  NIFEdipine (PROCARDIA-XL/ADALAT CC) 24 hr tablet 30 mg (30 mg Oral Not Given 12/04/14 0600)  quinapril (ACCUPRIL) tablet 40 mg (40 mg Oral Not Given 12/03/14 2200)  simvastatin (ZOCOR) tablet 40 mg (40 mg Oral Not Given 12/03/14 1800)  aspirin chewable tablet 81 mg (not administered)  potassium chloride 10 mEq in 100 mL IVPB (10 mEq Intravenous Given 12/02/14 2043)  levofloxacin (LEVAQUIN) IVPB 750 mg (750 mg Intravenous Given 12/03/14 1430)  cholecalciferol (VITAMIN D) tablet 2,000 Units (2,000 Units Oral Given 12/03/14 0949)  cholecalciferol (VITAMIN D) tablet 4,000 Units (not administered)  feeding supplement (ENSURE ENLIVE)  (ENSURE ENLIVE) liquid 237 mL (237 mLs Oral Not Given 12/03/14 1500)  antiseptic oral rinse (CPC / CETYLPYRIDINIUM CHLORIDE 0.05%) solution 7 mL (7 mLs Mouth Rinse Not Given 12/03/14 2200)  potassium chloride SA (K-DUR,KLOR-CON) CR tablet 40 mEq (40 mEq Oral Not Given 12/03/14 1600)  guaiFENesin (MUCINEX) 12 hr tablet 1,200 mg (1,200 mg Oral Not Given 12/03/14 2200)  levalbuterol (XOPENEX) nebulizer solution 0.63 mg (not administered)  HYDROcodone-acetaminophen (NORCO/VICODIN) 5-325 MG per tablet 1 tablet (not administered)  insulin glargine (LANTUS) injection 10 Units (10 Units Subcutaneous Given 12/03/14 1351)  levalbuterol (XOPENEX) nebulizer solution 0.63 mg (0.63 mg Nebulization Not Given 12/03/14 2102)  metoprolol (LOPRESSOR) injection 7.5 mg (7.5 mg Intravenous Given 12/04/14 0503)  insulin aspart (novoLOG) injection 0-20 Units (4 Units Subcutaneous Given  12/04/14 0400)  dextrose 5 % and 0.9% NaCl 1,000 mL with potassium chloride 40 mEq infusion (not administered)  potassium chloride 10 mEq in 100 mL IVPB (not administered)  sodium chloride 0.9 % bolus 1,000 mL (0 mLs Intravenous Stopped 12/02/14 1200)  cefTRIAXone (ROCEPHIN) 1 g in dextrose 5 % 50 mL IVPB (0 g Intravenous Stopped 12/02/14 1246)  azithromycin (ZITHROMAX) 500 mg in dextrose 5 % 250 mL IVPB (500 mg Intravenous New Bag/Given 12/02/14 1329)  sodium chloride 0.9 % bolus 1,000 mL (1,000 mLs Intravenous New Bag/Given 12/02/14 1252)    Followed by  sodium chloride 0.9 % bolus 500 mL (500 mLs Intravenous New Bag/Given 12/02/14 1332)  potassium chloride 10 mEq in 100 mL IVPB (10 mEq Intravenous Given 12/03/14 0145)  metoprolol (LOPRESSOR) injection 5 mg (5 mg Intravenous Given 12/03/14 0826)  sodium chloride 0.9 % bolus 500 mL (500 mLs Intravenous Given 12/03/14 0949)  magnesium sulfate 3 g in dextrose 5 % 100 mL IVPB (3 g Intravenous Given 12/03/14 1316)  insulin aspart (novoLOG) injection 25 Units (25 Units Subcutaneous Given 12/03/14 1316)     Labs Review Labs Reviewed  CBC WITH DIFFERENTIAL/PLATELET - Abnormal; Notable for the following:    WBC 26.7 (*)    RBC 3.88 (*)    Hemoglobin 11.4 (*)    HCT 34.4 (*)    Platelets 443 (*)    Neutrophils Relative % 92 (*)    Lymphocytes Relative 4 (*)    Neutro Abs 24.5 (*)    Monocytes Absolute 1.1 (*)    All other components within normal limits  COMPREHENSIVE METABOLIC PANEL - Abnormal; Notable for the following:    Potassium 3.0 (*)    Chloride 96 (*)    Glucose, Bld 269 (*)    BUN 41 (*)    Calcium 7.9 (*)    Total Protein 6.0 (*)    Albumin 1.8 (*)    AST 43 (*)    Total Bilirubin 1.6 (*)    All other components within normal limits  HEMOGLOBIN A1C - Abnormal; Notable for the following:    Hgb A1c MFr Bld 6.9 (*)    All other components within normal limits  CBC - Abnormal; Notable for the following:    WBC 22.3 (*)    RBC 3.61 (*)    Hemoglobin 10.6 (*)    HCT 32.6 (*)    Platelets 463 (*)    All other components within normal limits  BASIC METABOLIC PANEL - Abnormal; Notable for the following:    Potassium 3.2 (*)    CO2 21 (*)    Glucose, Bld 220 (*)    Calcium 7.4 (*)    All other components within normal limits  GLUCOSE, CAPILLARY - Abnormal; Notable for the following:    Glucose-Capillary 196 (*)    All other components within normal limits  GLUCOSE, CAPILLARY - Abnormal; Notable for the following:    Glucose-Capillary 145 (*)    All other components within normal limits  MAGNESIUM - Abnormal; Notable for the following:    Magnesium 1.5 (*)    All other components within normal limits  URINALYSIS, ROUTINE W REFLEX MICROSCOPIC (NOT AT [facility]) - Abnormal; Notable for the following:    Ketones, ur 40 (*)    All other components within normal limits  GLUCOSE, CAPILLARY - Abnormal; Notable for the following:    Glucose-Capillary 169 (*)    All other components within normal limits  GLUCOSE, CAPILLARY - Abnormal; Notable  for the following:     Glucose-Capillary 510 (*)    All other components within normal limits  HEMOGLOBIN A1C - Abnormal; Notable for the following:    Hgb A1c MFr Bld 6.8 (*)    All other components within normal limits  GLUCOSE, CAPILLARY - Abnormal; Notable for the following:    Glucose-Capillary 220 (*)    All other components within normal limits  GLUCOSE, CAPILLARY - Abnormal; Notable for the following:    Glucose-Capillary 130 (*)    All other components within normal limits  CBC WITH DIFFERENTIAL/PLATELET - Abnormal; Notable for the following:    WBC 18.5 (*)    RBC 3.79 (*)    Hemoglobin 10.9 (*)    HCT 33.8 (*)    Platelets 445 (*)    Neutrophils Relative % 91 (*)    Neutro Abs 16.9 (*)    Lymphocytes Relative 5 (*)    All other components within normal limits  BASIC METABOLIC PANEL - Abnormal; Notable for the following:    Potassium 3.0 (*)    Glucose, Bld 166 (*)    Calcium 7.6 (*)    All other components within normal limits  GLUCOSE, CAPILLARY - Abnormal; Notable for the following:    Glucose-Capillary 122 (*)    All other components within normal limits  GLUCOSE, CAPILLARY - Abnormal; Notable for the following:    Glucose-Capillary 154 (*)    All other components within normal limits  CULTURE, BLOOD (ROUTINE X 2)  CULTURE, BLOOD (ROUTINE X 2)  CLOSTRIDIUM DIFFICILE BY PCR (NOT AT [facility])  CULTURE, EXPECTORATED SPUTUM-ASSESSMENT  GRAM STAIN  URINE CULTURE  HIV ANTIBODY (ROUTINE TESTING)  LEGIONELLA ANTIGEN, URINE  STREP PNEUMONIAE URINARY ANTIGEN  GLUCOSE, CAPILLARY  I-STAT CG4 LACTIC ACID, ED  I-STAT CG4 LACTIC ACID, ED    Imaging Review Dg Chest 2 View  12/02/2014   CLINICAL DATA:  Altered level of consciousness.  EXAM: CHEST  2 VIEW  COMPARISON:  Sep 04, 2012.  FINDINGS: The heart size and mediastinal contours are within normal limits. No pneumothorax is noted. Left lung is clear. There is now noted diffuse opacification of the right lower lobe most consistent with pneumonia  with associated pleural effusion. The visualized skeletal structures are unremarkable.  IMPRESSION: Diffuse opacification of the right lower lobe most consistent with pneumonia with associated pleural effusion. Follow-up radiographs are recommended.   Electronically Signed   By: Marijo Conception, M.D.   On: 12/02/2014 10:57   Ct Chest Wo Contrast  12/04/2014   CLINICAL DATA:  Right hemithorax opacification, assess hemothorax, effusion and loculation. Review of electronic records demonstrates history of productive cough and shortness of breath for 3 weeks. Recent falls.  EXAM: CT CHEST WITHOUT CONTRAST  TECHNIQUE: Multidetector CT imaging of the chest was performed following the standard protocol without IV contrast.  COMPARISON:  Chest radiograph 12/02/2014  FINDINGS: Large volume of loculated right pleural fluid. Within the right lower hemithorax are multiple foci of air within the large volume pleural fluid raising concern for empyema. There is mass effect on the adjacent bronchi, with compressive atelectasis of the dependent right upper lobe and consolidation in the right middle lobe with air bronchograms. The origin of the right lower lobe bronchus is not well-defined, and no aerated right lower lobe lung is confidently identified. Pleural fluid is heterogeneous in density.  There is a small left pleural effusion that appears simple fluid in density. Adjacent compressive atelectasis in the lower lobe. The left  upper lobe is clear.  Heart at the upper limits of normal in size. There are dense coronary artery calcifications. No definite pericardial fluid. Prominent small superior mediastinal lymph nodes measuring 9 mm short axis dimension. Limited assessment for hilar adenopathy. Mild atherosclerosis of normal caliber thoracic aorta.  No definite acute abnormality in the included upper abdomen, perinephric stranding is partially included.  There are no acute rib fractures. Remote nondisplaced lower right rib  fractures with well-formed callus. Age related degenerative change throughout spine. No blastic or destructive lytic osseous lesions.  IMPRESSION: 1. Large partially loculated heterogeneous right pleural effusion. In the lower portion are multiple foci of air. Findings are most concerning for empyema. There is consolidation or atelectasis of the entire right lower lobe, consolidation with air bronchograms in the right middle lobe, and compressive atelectasis of the adjacent right upper lobe. Hemothorax is felt less likely, given the right lower rib fractures appear remote. 2. Small left pleural effusion with adjacent compressive atelectasis. 3. Dense coronary artery calcifications.   Electronically Signed   By: Jeb Levering M.D.   On: 12/04/2014 02:42     EKG Interpretation   Date/Time:  Thursday December 02 2014 09:01:36 EDT Ventricular Rate:  116 PR Interval:    QRS Duration: 86 QT Interval:  416 QTC Calculation: 578 R Axis:   121 Text Interpretation:  Atrial fibrillation Probable right ventricular  hypertrophy Nonspecific T abnrm, anterolateral leads Prolonged QT interval  Abnormal ekg Confirmed by Markee Remlinger  MD, [place] ([id number]) on 12/02/2014 9:24:31  AM     patient had moments of hypotension responded to fluids.  Patient was admitted.  Blood pressure improved after fluids.   MDM   Final diagnoses:  Altered level of consciousness        Leonard Schwartz, MD 12/04/14 [phone]

## 2014-12-04 NOTE — Progress Notes (Signed)
Name: Logan May MRN: [id number] DOB: 1946/03/24    ADMISSION DATE:  12/02/2014 CONSULTATION DATE:  12/03/2014  REFERRING MD :  Dr. Grandville Silos [facility]  CHIEF COMPLAINT:  DOE  BRIEF PATIENT DESCRIPTION: 69 year old male admitted 7/28 with complaints of productive cough and SOB x 3 weeks. Admitted 7/28 for R sided CAP with associated effusion on CXR. PCCM consulted for evaluation of effusion.   SIGNIFICANT EVENTS  7/29 admitted  STUDIES:  CT Chest 7/29:  Loculated R pleural effusion with gas formation.    SUBJECTIVE:  Weak, frequent falls, coughs with eating, unsteady on feet, dyspnea.    VITAL SIGNS: Temp:  [97.7 F (36.5 C)-99.4 F (37.4 C)] 98.4 F (36.9 C) (07/30 0602) Pulse Rate:  [88-125] 114 (07/30 0602) Resp:  [20-24] 20 (07/30 0602) BP: (116-125)/(61-71) 117/65 mmHg (07/30 0602) SpO2:  [96 %-100 %] 100 % (07/30 0809)  PHYSICAL EXAMINATION: General:  Chronically ill appearing male, agitated Neuro:  Agitated and somewhat confused, but will answer questions appropriately with re-direction. Non-focal HEENT:  [place]/AT, PERRL, no JVD Cardiovascular:  IRIR, tachy Lungs:  Diminished R lung fields Abdomen:  Soft, non-tender, non-distended Musculoskeletal:  No acute deformity or ROM limitation Skin:  Grossly intact   Recent Labs Lab 12/02/14 0917 12/03/14 0618 12/04/14 0524  NA 135 139 141  K 3.0* 3.2* 3.0*  CL 96* 104 109  CO2 26 21* 27  BUN 41* 20 9  CREATININE 1.17 0.95 0.63  GLUCOSE 269* 220* 166*    Recent Labs Lab 12/02/14 0917 12/03/14 0618 12/04/14 0524  HGB 11.4* 10.6* 10.9*  HCT 34.4* 32.6* 33.8*  WBC 26.7* 22.3* 18.5*  PLT 443* 463* 445*   I reviewed CT Chest as below:  Ct Chest Wo Contrast  12/04/2014   CLINICAL DATA:  Right hemithorax opacification, assess hemothorax, effusion and loculation. Review of electronic records demonstrates history of productive cough and shortness of breath for 3 weeks. Recent falls.  EXAM: CT CHEST WITHOUT  CONTRAST  TECHNIQUE: Multidetector CT imaging of the chest was performed following the standard protocol without IV contrast.  COMPARISON:  Chest radiograph 12/02/2014  FINDINGS: Large volume of loculated right pleural fluid. Within the right lower hemithorax are multiple foci of air within the large volume pleural fluid raising concern for empyema. There is mass effect on the adjacent bronchi, with compressive atelectasis of the dependent right upper lobe and consolidation in the right middle lobe with air bronchograms. The origin of the right lower lobe bronchus is not well-defined, and no aerated right lower lobe lung is confidently identified. Pleural fluid is heterogeneous in density.  There is a small left pleural effusion that appears simple fluid in density. Adjacent compressive atelectasis in the lower lobe. The left upper lobe is clear.  Heart at the upper limits of normal in size. There are dense coronary artery calcifications. No definite pericardial fluid. Prominent small superior mediastinal lymph nodes measuring 9 mm short axis dimension. Limited assessment for hilar adenopathy. Mild atherosclerosis of normal caliber thoracic aorta.  No definite acute abnormality in the included upper abdomen, perinephric stranding is partially included.  There are no acute rib fractures. Remote nondisplaced lower right rib fractures with well-formed callus. Age related degenerative change throughout spine. No blastic or destructive lytic osseous lesions.  IMPRESSION: 1. Large partially loculated heterogeneous right pleural effusion. In the lower portion are multiple foci of air. Findings are most concerning for empyema. There is consolidation or atelectasis of the entire right lower lobe, consolidation  with air bronchograms in the right middle lobe, and compressive atelectasis of the adjacent right upper lobe. Hemothorax is felt less likely, given the right lower rib fractures appear remote. 2. Small left pleural  effusion with adjacent compressive atelectasis. 3. Dense coronary artery calcifications.   Electronically Signed   By: Jeb Levering M.D.   On: 12/04/2014 02:42    ASSESSMENT / PLAN:  R loculated pleural effusion suspect empyema,  With prob aspiration PNA RLL  - will do thoracentesis and will need TCTS consult, I will call this, may need VATS or pigtail catheter per IR   CAP  Pos aspiration.   -agree with swallow eval - Continue CAP coverage with levaquin - Follow cultures 7/28 >>> - Supplemental O2 as needed to maintain SpO2 > 92%  Frequent falls Consider neurology evaluation of weakness, frequent falls  Glyn Ade  [phone]  Cell  [phone]  If no response or cell goes to voicemail, call beeper [phone]  12/04/2014 11:20 AM

## 2014-12-04 NOTE — Progress Notes (Addendum)
[facility] PROGRESS NOTE  Logan May OEU:[id number] DOB: [birth year] DOA: 12/02/2014 PCP: Redge Gainer, MD  Assessment/Plan: #1 Severe sepsis secondary to healthcare associated pneumonia  Patient is tachycardic and in A. fib with a leukocytosis. Patient has been pancultured. Patient is afebrile. Continue IV Levaquin, Mucinex, nebulizer treatments.  #2 healthcare associated pneumonia Sputum Gram stain and cultures pending. Urine Legionella antigen is negative. Urine pneumococcus antigen is negative. Continue oxygen, nebulizer treatments, IV Levaquin, Mucinex.  #3 Empyema/ right pleural effusion/parapneumonic effusion Likely a empyema. Patient has been pancultured cultures are pending. Continue empiric IV Levaquin. CT chest with large partially loculated. Thorough genius right pleural effusion in the lower portion with multiple foci of air. Findings concerning for empyema. Consolidation in the entire right lower lobe, consolidation with air bronchograms in the right middle lobe and compressive atelectasis of the adjacent right upper lobe. Patient has been seen in consultation by pulmonary/critical care medicine and patient subsequently underwent a diagnostic and therapeutic thoracentesis this morning with 900 mL of cloudy purulent putrid dark yellow green pleural fluid. Pulmonary ff. Patient has also been seen by cardiothoracic surgery, Dr. Roxan Hockey who was recommending a bronchoscopy, right VATS, drainage of empyema and decortication tomorrow morning.  #4 A. fib with RVR CHADS2Vasc SCORE 3 Likely secondary to problems #1 and 2. Patient with heart rate ranging from 88-134. D/C Procardia. Increase IV Lopressor to 10mg  every 6 hours. If no significant improvement in heart rate will need to be placed on a Cardizem drip. Patient not on anticoagulation likely secondary to history of falls.  #5 diabetes mellitus type 2 Hemoglobin A1c = 6.8. CBGs have ranged from 91- 154. Continue Lantus 10  units daily. Sliding scale insulin.  #6 Debility PT/OT. Will likely need SNF.  #7 QTc prolongation Will monitor. Repeat EKG in 1-2 days.  #8 dysphagia Patient is not on dysphagia 2 diet.  #9 weakness Patient had presented with lower extremity weakness that had been ongoing for several weeks. Patient also noted to have dysphagia. Concern for possible neurological disorder. Will consult with neurology for further evaluation and management.  #10 prophylaxis SCDs for DVT prophylaxis.  Code Status: Full Family Communication: updated patient and wife at bedside. Disposition Plan: Remain inpatient   Consultants:  PCCM: Dr Titus Mould 12/03/14  CVTS: Dr Roxan Hockey 12/04/2014  Procedures:  CXR 12/02/14  CT Chest 12/03/14  Diagnostic and therapeutic thoracentesis 12/04/2014 per Dr. Joya Gaskins  Antibiotics:  IV Levaquin 12/02/14  HPI/Subjective: Patent refusing to be examined. Patient states " hey boy". Patient states "get out of my room."  Objective: Filed Vitals:   12/04/14 1332  BP: 126/93  Pulse: 134  Temp: 99.5 F (37.5 C)  Resp: 18    Intake/Output Summary (Last 24 hours) at 12/04/14 1432 Last data filed at 12/04/14 1324  Gross per 24 hour  Intake   1756 ml  Output   2225 ml  Net   -469 ml   Filed Weights   12/02/14 0911 12/02/14 1504  Weight: 81.647 kg (180 lb) 82.555 kg (182 lb)    Exam:   General:  Patient refused exam  Cardiovascular: Patient refused  Respiratory: Patient refused  Abdomen: Patient refused  Musculoskeletal: Patient refused  Data Reviewed: Basic Metabolic Panel:  Recent Labs Lab 12/02/14 0917 12/03/14 0618 12/04/14 0524 12/04/14 0840  NA 135 139 141  --   K 3.0* 3.2* 3.0*  --   CL 96* 104 109  --   CO2 26 21* 27  --   GLUCOSE 269*  220* 166*  --   BUN 41* 20 9  --   CREATININE 1.17 0.95 0.63  --   CALCIUM 7.9* 7.4* 7.6*  --   MG  --  1.5*  --  1.4*   Liver Function Tests:  Recent Labs Lab 12/02/14 0917  AST 43*   ALT 36  ALKPHOS 93  BILITOT 1.6*  PROT 6.0*  ALBUMIN 1.8*   No results for input(s): LIPASE, AMYLASE in the last 168 hours. No results for input(s): AMMONIA in the last 168 hours. CBC:  Recent Labs Lab 12/02/14 0917 12/03/14 0618 12/04/14 0524  WBC 26.7* 22.3* 18.5*  NEUTROABS 24.5*  --  16.9*  HGB 11.4* 10.6* 10.9*  HCT 34.4* 32.6* 33.8*  MCV 88.7 90.3 89.2  PLT 443* 463* 445*   Cardiac Enzymes: No results for input(s): CKTOTAL, CKMB, CKMBINDEX, TROPONINI in the last 168 hours. BNP (last 3 results) No results for input(s): BNP in the last 8760 hours.  ProBNP (last 3 results) No results for input(s): PROBNP in the last 8760 hours.  CBG:  Recent Labs Lab 12/03/14 2144 12/04/14 0014 12/04/14 0425 12/04/14 0806 12/04/14 1206  GLUCAP 66 122* 154* 135* 91    Recent Results (from the past 240 hour(s))  Culture, blood (routine x 2)     Status: None (Preliminary result)   Collection Time: 12/02/14  9:17 AM  Result Value Ref Range Status   Specimen Description BLOOD LEFT ANTECUBITAL  Final   Special Requests BOTTLES DRAWN AEROBIC AND ANAEROBIC 5CC  Final   Culture NO GROWTH 2 DAYS  Final   Report Status PENDING  Incomplete  Culture, blood (routine x 2)     Status: None (Preliminary result)   Collection Time: 12/02/14 12:14 PM  Result Value Ref Range Status   Specimen Description BLOOD LEFT ANTECUBITAL  Final   Special Requests BOTTLES DRAWN AEROBIC AND ANAEROBIC 5CC  Final   Culture NO GROWTH 2 DAYS  Final   Report Status PENDING  Incomplete  Clostridium Difficile by PCR (not at [facility])     Status: None   Collection Time: 12/03/14 11:17 AM  Result Value Ref Range Status   C difficile by pcr NEGATIVE NEGATIVE Final  Culture, Urine     Status: None (Preliminary result)   Collection Time: 12/03/14  3:16 PM  Result Value Ref Range Status   Specimen Description URINE, RANDOM  Final   Special Requests NONE  Final   Culture CULTURE REINCUBATED FOR BETTER GROWTH   Final   Report Status PENDING  Incomplete     Studies: Dg Chest 1 View  12/04/2014   CLINICAL DATA:  Post right thoracentesis  EXAM: CHEST  1 VIEW  COMPARISON:  12/02/2014  FINDINGS: Diffuse right lung airspace disease again noted, slightly improved. Decreasing right effusion following thoracentesis. No pneumothorax. Heart is borderline in size. Nodular density projecting over the left lung base is felt represent nipple shadow. Left lung is clear. No acute bony abnormality.  IMPRESSION: Decreasing right effusion and diffuse right lung airspace disease. No pneumothorax.   Electronically Signed   By: Rolm Baptise M.D.   On: 12/04/2014 13:10   Ct Chest Wo Contrast  12/04/2014   CLINICAL DATA:  Right hemithorax opacification, assess hemothorax, effusion and loculation. Review of electronic records demonstrates history of productive cough and shortness of breath for 3 weeks. Recent falls.  EXAM: CT CHEST WITHOUT CONTRAST  TECHNIQUE: Multidetector CT imaging of the chest was performed following the standard protocol without IV  contrast.  COMPARISON:  Chest radiograph 12/02/2014  FINDINGS: Large volume of loculated right pleural fluid. Within the right lower hemithorax are multiple foci of air within the large volume pleural fluid raising concern for empyema. There is mass effect on the adjacent bronchi, with compressive atelectasis of the dependent right upper lobe and consolidation in the right middle lobe with air bronchograms. The origin of the right lower lobe bronchus is not well-defined, and no aerated right lower lobe lung is confidently identified. Pleural fluid is heterogeneous in density.  There is a small left pleural effusion that appears simple fluid in density. Adjacent compressive atelectasis in the lower lobe. The left upper lobe is clear.  Heart at the upper limits of normal in size. There are dense coronary artery calcifications. No definite pericardial fluid. Prominent small superior  mediastinal lymph nodes measuring 9 mm short axis dimension. Limited assessment for hilar adenopathy. Mild atherosclerosis of normal caliber thoracic aorta.  No definite acute abnormality in the included upper abdomen, perinephric stranding is partially included.  There are no acute rib fractures. Remote nondisplaced lower right rib fractures with well-formed callus. Age related degenerative change throughout spine. No blastic or destructive lytic osseous lesions.  IMPRESSION: 1. Large partially loculated heterogeneous right pleural effusion. In the lower portion are multiple foci of air. Findings are most concerning for empyema. There is consolidation or atelectasis of the entire right lower lobe, consolidation with air bronchograms in the right middle lobe, and compressive atelectasis of the adjacent right upper lobe. Hemothorax is felt less likely, given the right lower rib fractures appear remote. 2. Small left pleural effusion with adjacent compressive atelectasis. 3. Dense coronary artery calcifications.   Electronically Signed   By: Jeb Levering M.D.   On: 12/04/2014 02:42   Dg Swallowing Func-speech Pathology  12/04/2014    Objective Swallowing Evaluation:    Patient Details  Name: Logan May MRN: [id number] Date of Birth: 05-24-1945  Today's Date: 12/04/2014 Time: SLP Start Time (ACUTE ONLY): 1235-SLP Stop Time (ACUTE ONLY): 1258 SLP Time Calculation (min) (ACUTE ONLY): 23 min  Past Medical History:  Past Medical History  Diagnosis Date  . BPH (benign prostatic hypertrophy)   . Prostate cancer     implants   . Vitamin D deficiency     takes Vit D daily  . History of ETT 1999    Dr, Caleen Essex  . Anxiety     takes Valium daily as needed  . Hyperlipidemia     takes Simvastatin nightly  . Hypertension     takes Quinapril and Procardia daily  . Headache(784.0)     occasionally  . Peripheral neuropathy   . Diabetes mellitus, type 2     but doesn't take any meds for it;diet controlled and exercise  .  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 Pneumonia 1960's?  . CAP (community acquired pneumonia) 12/02/2014   Past Surgical History:  Past Surgical History  Procedure Laterality Date  . Foot surgery  May 2002    Dr. Sharol Given   . Left foot casted 4-5 months    . Bilateral  eye laser surgery Bilateral 2000    "related to diabetes"  . Charcot foot left Left 12/15/01  . Laser eye surgery right  12/2002  . Eye surgery    . Tonsillectomy    . I&d extremity Left 09/05/2012    Procedure: IRRIGATION AND DEBRIDEMENT EXTREMITY;  Surgeon: Newt Minion, MD;  Location: [place];  Service: Orthopedics;  Laterality: Left;   Excision Charcot Collapse Left Foot and Base 5th Metatarsal, Antibiotic  Beads  . Esophagogastroduodenoscopy      at least 75yrs ago  . Amputation Left 05/20/2013    Procedure: AMPUTATION DIGIT;  Surgeon: Newt Minion, MD;  Location: [place];  Service: Orthopedics;  Laterality: Left;  Left Great Toe Amputation  at [facility]   HPI:  Other Pertinent Information: Pt is a 69 y.o. male with PMH of Diabetes  type 2 with Charcot foot supposedly diet-controlled, hypertension,  generalized anxiety disorder, history of atrial fibrillation not on  anticoagulation presumably secondary to falls. Patient presents to the  hospital with 3 weeks of worsening productive cough with purulent sputum.  Guaifenesin has been helpful, but has not resolved his cough. He also has  been having increased weakness with some mild dyspnea on exertion which is  improved with rest. Also noted concerns of very dry mouth and poor oral  hygiene. CXR 7/28 revealed diffuse opacification of RLL consistent with  PNA with associated pleural effusion. Bedside swallow eval ordered to aide  in ruling out aspiration.   No Data Recorded  Assessment / Plan / Recommendation CHL IP CLINICAL IMPRESSIONS 12/04/2014  Therapy Diagnosis Mild oral phase dysphagia;Moderate pharyngeal phase  dysphagia;Mild cervical esophageal phase dysphagia  Clinical Impression Pt currently demonstrating a  mild oral/ moderate  pharyngeal dysphagia that appears sensorimotor in nature. Delay in swallow  initiation accompanied by reduced airway protection (reduced base of  tongue retraction and minimal epiglottic inversion) resulted in  penetration during the swallow x1 of thin liquids. Reduced epiglottic  inversion/ base of tongue retraction also resulted in moderate-severe  amounts of vallecular residuals across consistencies which led to  penetration post-swallow x1 of thin liquids- pt had a weak throat clear  response and cleared remaining penetrates with cued cough. Pt is not able  to control sip size and large sip of nectar thick liquids resulted in  significant residuals in vallecula, about half of which were cleared with  multiple swallows and cough/ throat clears (cued- pt did not sense).  Nectar thick liquids by teaspoon accompanied by chin tuck reduced  residuals and controlled sip size. Minimal backflow to cervical esophagus  observed across consistencies. Recommend initiating dysphagia 2 diet,  nectar thick liquids by teaspoon, meds crushed in puree, full supervision  to cue pt to tuck chin, swallow 2x with chin down. Also have pt sit  upright 30 minutes after meal. Pt was able to follow instructions for  strategies during MBS with a visual cue- hopeful that this will continue  at bedside as well but decreased cognitive status continues to put pt at  increased risk of aspiration. SLP will continue to follow closely for diet  tolerance/ consider advancement or repeat MBS as cognitive status  improves.      CHL IP TREATMENT RECOMMENDATION 12/04/2014  Treatment Recommendations Therapy as outlined in treatment plan below     CHL IP DIET RECOMMENDATION 12/04/2014  SLP Diet Recommendations Dysphagia 2 (Fine chop);Nectar  Liquid Administration via (None)  Medication Administration Crushed with puree  Compensations Slow rate;Small sips/bites;Multiple dry swallows after each  bite/sip;Clear throat intermittently;Chin  tuck  Postural Changes and/or Swallow Maneuvers (None)     CHL IP OTHER RECOMMENDATIONS 12/04/2014  Recommended Consults (None)  Oral Care Recommendations Oral care BID  Other Recommendations Order thickener from pharmacy;Clarify dietary  restrictions     No flowsheet data found.   CHL IP FREQUENCY AND DURATION 12/04/2014  Speech  Therapy Frequency (ACUTE ONLY) min 2x/week  Treatment Duration 2 weeks     Pertinent Vitals/Pain none    SLP Swallow Goals No flowsheet data found.  No flowsheet data found.    CHL IP REASON FOR REFERRAL 12/04/2014  Reason for Referral Objectively evaluate swallowing function     CHL IP ORAL PHASE 12/04/2014  Lips (None)  Tongue (None)  Mucous membranes (None)  Nutritional status (None)  Other (None)  Oxygen therapy (None)  Oral Phase Impaired  Oral - Pudding Teaspoon (None)  Oral - Pudding Cup (None)  Oral - Honey Teaspoon (None)  Oral - Honey Cup (None)  Oral - Honey Syringe (None)  Oral - Nectar Teaspoon (None)  Oral - Nectar Cup (None)  Oral - Nectar Straw (None)  Oral - Nectar Syringe (None)  Oral - Ice Chips (None)  Oral - Thin Teaspoon (None)  Oral - Thin Cup (None)  Oral - Thin Straw (None)  Oral - Thin Syringe (None)  Oral - Puree (None)  Oral - Mechanical Soft (None)  Oral - Regular (None)  Oral - Multi-consistency (None)  Oral - Pill (None)  Oral Phase - Comment (None)      CHL IP PHARYNGEAL PHASE 12/04/2014  Pharyngeal Phase Impaired  Pharyngeal - Pudding Teaspoon (None)  Penetration/Aspiration details (pudding teaspoon) (None)  Pharyngeal - Pudding Cup (None)  Penetration/Aspiration details (pudding cup) (None)  Pharyngeal - Honey Teaspoon (None)  Penetration/Aspiration details (honey teaspoon) (None)  Pharyngeal - Honey Cup (None)  Penetration/Aspiration details (honey cup) (None)  Pharyngeal - Honey Syringe (None)  Penetration/Aspiration details (honey syringe) (None)  Pharyngeal - Nectar Teaspoon (None)  Penetration/Aspiration details (nectar teaspoon) (None)  Pharyngeal - Nectar  Cup (None)  Penetration/Aspiration details (nectar cup) (None)  Pharyngeal - Nectar Straw (None)  Penetration/Aspiration details (nectar straw) (None)  Pharyngeal - Nectar Syringe (None)  Penetration/Aspiration details (nectar syringe) (None)  Pharyngeal - Ice Chips (None)  Penetration/Aspiration details (ice chips) (None)  Pharyngeal - Thin Teaspoon (None)  Penetration/Aspiration details (thin teaspoon) (None)  Pharyngeal - Thin Cup (None)  Penetration/Aspiration details (thin cup) (None)  Pharyngeal - Thin Straw (None)  Penetration/Aspiration details (thin straw) (None)  Pharyngeal - Thin Syringe (None)  Penetration/Aspiration details (thin syringe') (None)  Pharyngeal - Puree (None)  Penetration/Aspiration details (puree) (None)  Pharyngeal - Mechanical Soft (None)  Penetration/Aspiration details (mechanical soft) (None)  Pharyngeal - Regular (None)  Penetration/Aspiration details (regular) (None)  Pharyngeal - Multi-consistency (None)  Penetration/Aspiration details (multi-consistency) (None)  Pharyngeal - Pill (None)  Penetration/Aspiration details (pill) (None)  Pharyngeal Comment (None)      CHL IP CERVICAL ESOPHAGEAL PHASE 12/04/2014  Cervical Esophageal Phase Impaired  Pudding Teaspoon (None)  Pudding Cup (None)  Honey Teaspoon (None)  Honey Cup (None)  Honey Straw (None)  Nectar Teaspoon Reduced cricopharyngeal relaxation;Esophageal backflow  into cervical esophagus  Nectar Cup Reduced cricopharyngeal relaxation;Esophageal backflow into  cervical esophagus  Nectar Straw (None)  Nectar Sippy Cup (None)  Thin Teaspoon (None)  Thin Cup Reduced cricopharyngeal relaxation;Esophageal backflow into  cervical esophagus  Thin Straw (None)  Thin Sippy Cup (None)  Cervical Esophageal Comment (None)    No flowsheet data found.         Kern Reap, MA, CCC-SLP 12/04/2014, 1:55 PM [phone]    Scheduled Meds: . antiseptic oral rinse  7 mL Mouth Rinse BID  . cholecalciferol  2,000 Units Oral Once per day on Mon Tue  Wed Thu Fri  . cholecalciferol  4,000 Units Oral Once per  day on Sun Sat  . feeding supplement (ENSURE ENLIVE)  237 mL Oral BID BM  . guaiFENesin  1,200 mg Oral BID  . insulin aspart  0-20 Units Subcutaneous Q4H  . insulin glargine  10 Units Subcutaneous [id number]  . levalbuterol  0.63 mg Nebulization TID  . levofloxacin (LEVAQUIN) IV  750 mg Intravenous Q24H  . magnesium sulfate 1 - 4 g bolus IVPB  4 g Intravenous Once  . metoprolol  7.5 mg Intravenous 4 times per day   Continuous Infusions: . dextrose 5 % and 0.9% NaCl 1,000 mL with potassium chloride 40 mEq infusion 125 mL/hr at 12/04/14 0745    Principal Problem:   Severe sepsis Active Problems:   Diabetes mellitus type 2, controlled, with complications   Hypertension   Hyperlipidemia   Generalized anxiety disorder   CAP (community acquired pneumonia)   Atrial fibrillation   Prolonged Q-T interval on ECG   Weakness   Pressure ulcer   Pleural effusion   HCAP (healthcare-associated pneumonia)   Altered level of consciousness   Empyema of right pleural space   At high risk for falls    Time spent: 35 mins    [facility] MD [facility] Pager [phone]. If 7PM-7AM, please contact night-coverage at [email or web address], password [facility] 12/04/2014, 2:32 PM  LOS: 2 days

## 2014-12-04 NOTE — Progress Notes (Signed)
CSW (Clinical Education officer, museum) visited pt room but pt out of room for testing and no family present. CSW attempted to speak with family over the phone but unable to reach anyone. Will try again later today or await return phone call.  If plan is for dc to SNF, pt will need to be sitter free for 24 hours.  Taavi Hoose Jenny Reichmann Weekend CSW [phone]

## 2014-12-04 NOTE — Procedures (Signed)
Thoracentesis Procedure Note  Pre-operative Diagnosis: Pleural effusion  Post-operative Diagnosis: empyema, putrid purulent fluid from R chest   Indications: R pleural effusion, aspiration pna  Procedure Details  Consent: Informed consent was obtained. Risks of the procedure were discussed including: infection, bleeding, pain, pneumothorax.  Under sterile conditions the patient was positioned.Chlorhexidene and sterile drapes were utilized.  1% buffered lidocaine was used to anesthetize the 8th R posterior rib space. Fluid was obtained without any difficulties and minimal blood loss.  A dressing was applied to the wound and wound care instructions were provided.   Findings 900 ml of cloudy purulent putrid dark yellow- green pleural fluid was obtained. A sample was sent to Pathology for cytogenetics, flow, and cell counts, as well as for infection analysis.  Complications:  None; patient tolerated the procedure well.          Condition: stable  Plan A follow up chest x-ray was ordered. Bed Rest for 4  hours. Tylenol 650 mg. for pain.  Attending Attestation: I performed the procedure.  Mariel Sleet Beeper  [phone]  Cell  [phone]  If no response or cell goes to voicemail, call beeper [phone] 12:08 PM 12/04/2014

## 2014-12-04 NOTE — Clinical Documentation Improvement (Addendum)
Possible Clinical Conditions, including suspected or known cause, and any associated condition(s):   - Acute Kidney Injury secondary to ........................, present on admission, improving  - Other condition  - Unable to clinically determine    Clinical Information: Creatinine has improved by 0.54 mg/dL from 12/02/14 to 12/04/14 as noted below.  Patient was treated with IV fluid hydration in ED for hypotension with positive clinical response.  "Severe sepsis secondary to healthcare associated pneumonia" is documented in progress note 12/03/14 and marked POA on the current hospital problem list.  Component     Latest Ref Rng 12/02/2014 12/03/2014 12/04/2014  BUN     6 - 20 mg/dL 41 (H) 20 9  Creatinine     0.61 - 1.24 mg/dL 1.17 0.95 0.63  EGFR (Non-African Amer.)     >60 mL/min >60 >60 >60                                Thank You, Erling Conte ,RN Clinical Documentation Specialist:  [facility]

## 2014-12-04 NOTE — Progress Notes (Signed)
Levaquin d/c'd after gram stain of pleural fluid showed gram neg and pos cocci. Started vanc/zosyn.

## 2014-12-04 NOTE — Progress Notes (Signed)
MBSS complete. Full report located under chart review in imaging section.  Recommendations: Dysphagia 2 diet, nectar thick liquids by teaspoon, meds crushed in puree, chin tuck/ multiple swallows, intermittent throat clear (pt will likely need verbal/ visual cues to follow recommendations). Full supervision.  Blair Heys, [place], [place] Pager: [phone]

## 2014-12-04 NOTE — Consult Note (Signed)
Reason for Consult:Right empyema Referring Physician: Dr. Joya Gaskins Dr. Redge May is primary  Logan May is an 69 y.o. male.  HPI: Logan May is a 69 yo man with multiple medical problems including hypertension, hyperlipidemia, atrial fibrillation, prolonged QT, type II diabetes, Charcot foot. He presents with a 3 week history of a productive cough. Sputum is greenish- yellow. He took mucinex with some improvement. Over the past several days he became weaker and short of breath with exertion. He denies fevers but has had some chills. He also complains of back pain.  He was admitted on 7/28 with a diagnosis of pneumonia. He had a CT chest which showed a large right pleural effusion consistent with an empyema. He had thoracentesis today with 900 ml of greenish fluid being evacuated. Chest xray showed a decrease in the size of the effusion, but it is still rather large.    Past Medical History  Diagnosis Date  . BPH (benign prostatic hypertrophy)   . Prostate cancer     implants   . Vitamin D deficiency     takes Vit D daily  . History of ETT 1999    Dr, Logan May  . Anxiety     takes Valium daily as needed  . Hyperlipidemia     takes Simvastatin nightly  . Hypertension     takes Quinapril and Procardia daily  . Headache(784.0)     occasionally  . Peripheral neuropathy   . Diabetes mellitus, type 2     but doesn't take any meds for it;diet controlled and exercise  . 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 Pneumonia 1960's?  . CAP (community acquired pneumonia) 12/02/2014    Past Surgical History  Procedure Laterality Date  . Foot surgery  May 2002    Dr. Sharol May   . Left foot casted 4-5 months    . Bilateral  eye laser surgery Bilateral 2000    "related to diabetes"  . Charcot foot left Left 12/15/01  . Laser eye surgery right  12/2002  . Eye surgery    . Tonsillectomy    . I&d extremity Left 09/05/2012    Procedure: IRRIGATION AND DEBRIDEMENT EXTREMITY;  Surgeon: Logan Minion, MD;  Location: [place];  Service: Orthopedics;  Laterality: Left;  Excision Charcot Collapse Left Foot and Base 5th Metatarsal, Antibiotic Beads  . Esophagogastroduodenoscopy      at least 69yrs ago  . Amputation Left 05/20/2013    Procedure: AMPUTATION DIGIT;  Surgeon: Logan Minion, MD;  Location: [place];  Service: Orthopedics;  Laterality: Left;  Left Great Toe Amputation at [facility]    History reviewed. No pertinent family history.  Social History:  reports that he has never smoked. He has never used smokeless tobacco. He reports that he drinks about 25.2 oz of alcohol per week. He reports that he does not use illicit drugs.  Allergies:  Allergies  Allergen Reactions  . Tetanus Toxoids Other (See Comments)    "Blacked out"    Medications:  Scheduled: . antiseptic oral rinse  7 mL Mouth Rinse BID  . cholecalciferol  2,000 Units Oral Once per day on Mon Tue Wed Thu Fri  . cholecalciferol  4,000 Units Oral Once per day on Sun Sat  . feeding supplement (ENSURE ENLIVE)  237 mL Oral BID BM  . guaiFENesin  1,200 mg Oral BID  . insulin aspart  0-20 Units Subcutaneous Q4H  . insulin glargine  10 Units Subcutaneous BH-q7a  .  levalbuterol  0.63 mg Nebulization TID  . levofloxacin (LEVAQUIN) IV  750 mg Intravenous Q24H  . magnesium sulfate 1 - 4 g bolus IVPB  4 g Intravenous Once  . metoprolol  7.5 mg Intravenous 4 times per day    Results for orders placed or performed during the hospital encounter of 12/02/14 (from the past 48 hour(s))  Legionella antigen, urine  (not at [facility])     Status: None   Collection Time: 12/02/14  4:36 PM  Result Value Ref Range   Specimen Description URINE, RANDOM    Special Requests NONE    Legionella Antigen, Urine      Negative for Legionella pneumophila serogroup 1                                                              Legionella pneumophila serogroup 1 antigen can be detected in urine within 2 to 3 days of infection and May persist even after  treatment. This  assay does not detect other Legionella species or serogroups. Performed at [facility]    Report Status 12/03/2014 FINAL   Strep pneumoniae urinary antigen  (not at [facility])     Status: None   Collection Time: 12/02/14  4:36 PM  Result Value Ref Range   Strep Pneumo Urinary Antigen NEGATIVE NEGATIVE    Comment:        Infection due to S. pneumoniae cannot be absolutely ruled out since the antigen present May be below the detection limit of the test.   Glucose, capillary     Status: Abnormal   Collection Time: 12/02/14  4:51 PM  Result Value Ref Range   Glucose-Capillary 196 (H) 65 - 99 mg/dL  HIV antibody     Status: None   Collection Time: 12/02/14  5:15 PM  Result Value Ref Range   HIV Screen 4th Generation wRfx Non Reactive Non Reactive    Comment: (NOTE) Performed At: [facility] [place], [place] [id number] Lindon Romp MD [id number]   Hemoglobin A1c     Status: Abnormal   Collection Time: 12/02/14  5:15 PM  Result Value Ref Range   Hgb A1c MFr Bld 6.9 (H) 4.8 - 5.6 %    Comment: (NOTE)         Pre-diabetes: 5.7 - 6.4         Diabetes: >6.4         Glycemic control for adults with diabetes: <7.0    Mean Plasma Glucose 151 mg/dL    Comment: (NOTE) Performed At: [facility] [place], [place] [id number] Lindon Romp MD [id number]   Glucose, capillary     Status: Abnormal   Collection Time: 12/02/14 10:19 PM  Result Value Ref Range   Glucose-Capillary 145 (H) 65 - 99 mg/dL  CBC     Status: Abnormal   Collection Time: 12/03/14  6:18 AM  Result Value Ref Range   WBC 22.3 (H) 4.0 - 10.5 K/uL   RBC 3.61 (L) 4.22 - 5.81 MIL/uL   Hemoglobin 10.6 (L) 13.0 - 17.0 g/dL   HCT 32.6 (L) 39.0 - 52.0 %   MCV 90.3 78.0 - 100.0 fL   MCH 29.4 26.0 - 34.0 pg   MCHC 32.5 30.0 - 36.0  g/dL   RDW 14.2 11.5 - 15.5 %   Platelets 463 (H) 150 - 400 K/uL  Basic metabolic panel     Status: Abnormal    Collection Time: 12/03/14  6:18 AM  Result Value Ref Range   Sodium 139 135 - 145 mmol/L   Potassium 3.2 (L) 3.5 - 5.1 mmol/L   Chloride 104 101 - 111 mmol/L   CO2 21 (L) 22 - 32 mmol/L   Glucose, Bld 220 (H) 65 - 99 mg/dL   BUN 20 6 - 20 mg/dL   Creatinine, Ser 0.95 0.61 - 1.24 mg/dL   Calcium 7.4 (L) 8.9 - 10.3 mg/dL   GFR calc non Af Amer >60 >60 mL/min   GFR calc Af Amer >60 >60 mL/min    Comment: (NOTE) The eGFR has been calculated using the CKD EPI equation. This calculation has not been validated in all clinical situations. eGFR's persistently <60 mL/min signify possible Chronic Kidney Disease.    Anion gap 14 5 - 15  Magnesium     Status: Abnormal   Collection Time: 12/03/14  6:18 AM  Result Value Ref Range   Magnesium 1.5 (L) 1.7 - 2.4 mg/dL  Hemoglobin A1c     Status: Abnormal   Collection Time: 12/03/14  6:18 AM  Result Value Ref Range   Hgb A1c MFr Bld 6.8 (H) 4.8 - 5.6 %    Comment: (NOTE)         Pre-diabetes: 5.7 - 6.4         Diabetes: >6.4         Glycemic control for adults with diabetes: <7.0    Mean Plasma Glucose 148 mg/dL    Comment: (NOTE) Performed At: [facility] [place], [place] [id number] Lindon Romp MD [id number]   Glucose, capillary     Status: Abnormal   Collection Time: 12/03/14  7:58 AM  Result Value Ref Range   Glucose-Capillary 169 (H) 65 - 99 mg/dL  Clostridium Difficile by PCR (not at [facility])     Status: None   Collection Time: 12/03/14 11:17 AM  Result Value Ref Range   C difficile by pcr NEGATIVE NEGATIVE  Glucose, capillary     Status: Abnormal   Collection Time: 12/03/14 12:39 PM  Result Value Ref Range   Glucose-Capillary 510 (H) 65 - 99 mg/dL  Glucose, capillary     Status: Abnormal   Collection Time: 12/03/14  2:10 PM  Result Value Ref Range   Glucose-Capillary 220 (H) 65 - 99 mg/dL  Urinalysis, Routine w reflex microscopic (not at [facility])     Status: Abnormal   Collection Time: 12/03/14   3:16 PM  Result Value Ref Range   Color, Urine YELLOW YELLOW   APPearance CLEAR CLEAR   Specific Gravity, Urine 1.016 1.005 - 1.030   pH 5.5 5.0 - 8.0   Glucose, UA NEGATIVE NEGATIVE mg/dL   Hgb urine dipstick NEGATIVE NEGATIVE   Bilirubin Urine NEGATIVE NEGATIVE   Ketones, ur 40 (A) NEGATIVE mg/dL   Protein, ur NEGATIVE NEGATIVE mg/dL   Urobilinogen, UA 1.0 0.0 - 1.0 mg/dL   Nitrite NEGATIVE NEGATIVE   Leukocytes, UA NEGATIVE NEGATIVE    Comment: MICROSCOPIC NOT DONE ON URINES WITH NEGATIVE PROTEIN, BLOOD, LEUKOCYTES, NITRITE, OR GLUCOSE <1000 mg/dL.  Culture, Urine     Status: None (Preliminary result)   Collection Time: 12/03/14  3:16 PM  Result Value Ref Range   Specimen Description URINE, RANDOM    Special  Requests NONE    Culture CULTURE REINCUBATED FOR BETTER GROWTH    Report Status PENDING   Glucose, capillary     Status: Abnormal   Collection Time: 12/03/14  5:19 PM  Result Value Ref Range   Glucose-Capillary 130 (H) 65 - 99 mg/dL  Glucose, capillary     Status: None   Collection Time: 12/03/14  9:44 PM  Result Value Ref Range   Glucose-Capillary 66 65 - 99 mg/dL  Glucose, capillary     Status: Abnormal   Collection Time: 12/04/14 12:14 AM  Result Value Ref Range   Glucose-Capillary 122 (H) 65 - 99 mg/dL  Glucose, capillary     Status: Abnormal   Collection Time: 12/04/14  4:25 AM  Result Value Ref Range   Glucose-Capillary 154 (H) 65 - 99 mg/dL  CBC with Differential/Platelet     Status: Abnormal   Collection Time: 12/04/14  5:24 AM  Result Value Ref Range   WBC 18.5 (H) 4.0 - 10.5 K/uL   RBC 3.79 (L) 4.22 - 5.81 MIL/uL   Hemoglobin 10.9 (L) 13.0 - 17.0 g/dL   HCT 33.8 (L) 39.0 - 52.0 %   MCV 89.2 78.0 - 100.0 fL   MCH 28.8 26.0 - 34.0 pg   MCHC 32.2 30.0 - 36.0 g/dL   RDW 14.6 11.5 - 15.5 %   Platelets 445 (H) 150 - 400 K/uL   Neutrophils Relative % 91 (H) 43 - 77 %   Neutro Abs 16.9 (H) 1.7 - 7.7 K/uL   Lymphocytes Relative 5 (L) 12 - 46 %   Lymphs Abs  0.9 0.7 - 4.0 K/uL   Monocytes Relative 4 3 - 12 %   Monocytes Absolute 0.8 0.1 - 1.0 K/uL   Eosinophils Relative 0 0 - 5 %   Eosinophils Absolute 0.0 0.0 - 0.7 K/uL   Basophils Relative 0 0 - 1 %   Basophils Absolute 0.0 0.0 - 0.1 K/uL  Basic metabolic panel     Status: Abnormal   Collection Time: 12/04/14  5:24 AM  Result Value Ref Range   Sodium 141 135 - 145 mmol/L   Potassium 3.0 (L) 3.5 - 5.1 mmol/L   Chloride 109 101 - 111 mmol/L   CO2 27 22 - 32 mmol/L   Glucose, Bld 166 (H) 65 - 99 mg/dL   BUN 9 6 - 20 mg/dL   Creatinine, Ser 0.63 0.61 - 1.24 mg/dL   Calcium 7.6 (L) 8.9 - 10.3 mg/dL   GFR calc non Af Amer >60 >60 mL/min   GFR calc Af Amer >60 >60 mL/min    Comment: (NOTE) The eGFR has been calculated using the CKD EPI equation. This calculation has not been validated in all clinical situations. eGFR's persistently <60 mL/min signify possible Chronic Kidney Disease.    Anion gap 5 5 - 15  Glucose, capillary     Status: Abnormal   Collection Time: 12/04/14  8:06 AM  Result Value Ref Range   Glucose-Capillary 135 (H) 65 - 99 mg/dL  Magnesium     Status: Abnormal   Collection Time: 12/04/14  8:40 AM  Result Value Ref Range   Magnesium 1.4 (L) 1.7 - 2.4 mg/dL  Glucose, capillary     Status: None   Collection Time: 12/04/14 12:06 PM  Result Value Ref Range   Glucose-Capillary 91 65 - 99 mg/dL    Dg Chest 1 View  12/04/2014   CLINICAL DATA:  Post right thoracentesis  EXAM: CHEST  1 VIEW  COMPARISON:  12/02/2014  FINDINGS: Diffuse right lung airspace disease again noted, slightly improved. Decreasing right effusion following thoracentesis. No pneumothorax. Heart is borderline in size. Nodular density projecting over the left lung base is felt represent nipple shadow. Left lung is clear. No acute bony abnormality.  IMPRESSION: Decreasing right effusion and diffuse right lung airspace disease. No pneumothorax.   Electronically Signed   By: Rolm Baptise M.D.   On: 12/04/2014  13:10   Ct Chest Wo Contrast  12/04/2014   CLINICAL DATA:  Right hemithorax opacification, assess hemothorax, effusion and loculation. Review of electronic records demonstrates history of productive cough and shortness of breath for 3 weeks. Recent falls.  EXAM: CT CHEST WITHOUT CONTRAST  TECHNIQUE: Multidetector CT imaging of the chest was performed following the standard protocol without IV contrast.  COMPARISON:  Chest radiograph 12/02/2014  FINDINGS: Large volume of loculated right pleural fluid. Within the right lower hemithorax are multiple foci of air within the large volume pleural fluid raising concern for empyema. There is mass effect on the adjacent bronchi, with compressive atelectasis of the dependent right upper lobe and consolidation in the right middle lobe with air bronchograms. The origin of the right lower lobe bronchus is not well-defined, and no aerated right lower lobe lung is confidently identified. Pleural fluid is heterogeneous in density.  There is a small left pleural effusion that appears simple fluid in density. Adjacent compressive atelectasis in the lower lobe. The left upper lobe is clear.  Heart at the upper limits of normal in size. There are dense coronary artery calcifications. No definite pericardial fluid. Prominent small superior mediastinal lymph nodes measuring 9 mm short axis dimension. Limited assessment for hilar adenopathy. Mild atherosclerosis of normal caliber thoracic aorta.  No definite acute abnormality in the included upper abdomen, perinephric stranding is partially included.  There are no acute rib fractures. Remote nondisplaced lower right rib fractures with well-formed callus. Age related degenerative change throughout spine. No blastic or destructive lytic osseous lesions.  IMPRESSION: 1. Large partially loculated heterogeneous right pleural effusion. In the lower portion are multiple foci of air. Findings are most concerning for empyema. There is  consolidation or atelectasis of the entire right lower lobe, consolidation with air bronchograms in the right middle lobe, and compressive atelectasis of the adjacent right upper lobe. Hemothorax is felt less likely, May the right lower rib fractures appear remote. 2. Small left pleural effusion with adjacent compressive atelectasis. 3. Dense coronary artery calcifications.   Electronically Signed   By: Jeb Levering M.D.   On: 12/04/2014 02:42   Dg Swallowing Func-speech Pathology  12/04/2014    Objective Swallowing Evaluation:    Patient Details  Name: Logan May MRN: [id number] Date of Birth: [birth year]  Today's Date: 12/04/2014 Time: SLP Start Time (ACUTE ONLY): 1235-SLP Stop Time (ACUTE ONLY): 1258 SLP Time Calculation (min) (ACUTE ONLY): 23 min  Past Medical History:  Past Medical History  Diagnosis Date  . BPH (benign prostatic hypertrophy)   . Prostate cancer     implants   . Vitamin D deficiency     takes Vit D daily  . History of ETT 1999    Dr, Logan May  . Anxiety     takes Valium daily as needed  . Hyperlipidemia     takes Simvastatin nightly  . Hypertension     takes Quinapril and Procardia daily  . Headache(784.0)     occasionally  . Peripheral neuropathy   .  Diabetes mellitus, type 2     but doesn't take any meds for it;diet controlled and exercise  . 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 Pneumonia 1960's?  . CAP (community acquired pneumonia) 12/02/2014   Past Surgical History:  Past Surgical History  Procedure Laterality Date  . Foot surgery  May 2002    Dr. Lajoyce Corners   . Left foot casted 4-5 months    . Bilateral  eye laser surgery Bilateral 2000    "related to diabetes"  . Charcot foot left Left 12/15/01  . Laser eye surgery right  12/2002  . Eye surgery    . Tonsillectomy    . I&d extremity Left 09/05/2012    Procedure: IRRIGATION AND DEBRIDEMENT EXTREMITY;  Surgeon: Nadara Mustard, MD;  Location: [facility];  Service: Orthopedics;  Laterality: Left;   Excision Charcot Collapse Left Foot and Base 5th  Metatarsal, Antibiotic  Beads  . Esophagogastroduodenoscopy      at least 14yrs ago  . Amputation Left 05/20/2013    Procedure: AMPUTATION DIGIT;  Surgeon: Nadara Mustard, MD;  Location: [facility]  OR;  Service: Orthopedics;  Laterality: Left;  Left Great Toe Amputation  at [facility]   HPI:  Other Pertinent Information: Pt is a 69 y.o. male with PMH of Diabetes  type 2 with Charcot foot supposedly diet-controlled, hypertension,  generalized anxiety disorder, history of atrial fibrillation not on  anticoagulation presumably secondary to falls. Patient presents to the  hospital with 3 weeks of worsening productive cough with purulent sputum.  Guaifenesin has been helpful, but has not resolved his cough. He also has  been having increased weakness with some mild dyspnea on exertion which is  improved with rest. Also noted concerns of very dry mouth and poor oral  hygiene. CXR 7/28 revealed diffuse opacification of RLL consistent with  PNA with associated pleural effusion. Bedside swallow eval ordered to aide  in ruling out aspiration.   No Data Recorded  Assessment / Plan / Recommendation CHL IP CLINICAL IMPRESSIONS 12/04/2014  Therapy Diagnosis Mild oral phase dysphagia;Moderate pharyngeal phase  dysphagia;Mild cervical esophageal phase dysphagia  Clinical Impression Pt currently demonstrating a mild oral/ moderate  pharyngeal dysphagia that appears sensorimotor in nature. Delay in swallow  initiation accompanied by reduced airway protection (reduced base of  tongue retraction and minimal epiglottic inversion) resulted in  penetration during the swallow x1 of thin liquids. Reduced epiglottic  inversion/ base of tongue retraction also resulted in moderate-severe  amounts of vallecular residuals across consistencies which led to  penetration post-swallow x1 of thin liquids- pt had a weak throat clear  response and cleared remaining penetrates with cued cough. Pt is not able  to control sip size and large sip of nectar thick liquids  resulted in  significant residuals in vallecula, about half of which were cleared with  multiple swallows and cough/ throat clears (cued- pt did not sense).  Nectar thick liquids by teaspoon accompanied by chin tuck reduced  residuals and controlled sip size. Minimal backflow to cervical esophagus  observed across consistencies. Recommend initiating dysphagia 2 diet,  nectar thick liquids by teaspoon, meds crushed in puree, full supervision  to cue pt to tuck chin, swallow 2x with chin down. Also have pt sit  upright 30 minutes after meal. Pt was able to follow instructions for  strategies during MBS with a visual cue- hopeful that this will continue  at bedside as well but decreased cognitive status continues to put pt at  increased risk of aspiration. SLP will continue to follow closely for diet  tolerance/ consider advancement or repeat MBS as cognitive status  improves.      CHL IP TREATMENT RECOMMENDATION 12/04/2014  Treatment Recommendations Therapy as outlined in treatment plan below     CHL IP DIET RECOMMENDATION 12/04/2014  SLP Diet Recommendations Dysphagia 2 (Fine chop);Nectar  Liquid Administration via (None)  Medication Administration Crushed with puree  Compensations Slow rate;Small sips/bites;Multiple dry swallows after each  bite/sip;Clear throat intermittently;Chin tuck  Postural Changes and/or Swallow Maneuvers (None)     CHL IP OTHER RECOMMENDATIONS 12/04/2014  Recommended Consults (None)  Oral Care Recommendations Oral care BID  Other Recommendations Order thickener from pharmacy;Clarify dietary  restrictions     No flowsheet data found.   CHL IP FREQUENCY AND DURATION 12/04/2014  Speech Therapy Frequency (ACUTE ONLY) min 2x/week  Treatment Duration 2 weeks     Pertinent Vitals/Pain none    SLP Swallow Goals No flowsheet data found.  No flowsheet data found.    CHL IP REASON FOR REFERRAL 12/04/2014  Reason for Referral Objectively evaluate swallowing function     CHL IP ORAL PHASE 12/04/2014  Lips  (None)  Tongue (None)  Mucous membranes (None)  Nutritional status (None)  Other (None)  Oxygen therapy (None)  Oral Phase Impaired  Oral - Pudding Teaspoon (None)  Oral - Pudding Cup (None)  Oral - Honey Teaspoon (None)  Oral - Honey Cup (None)  Oral - Honey Syringe (None)  Oral - Nectar Teaspoon (None)  Oral - Nectar Cup (None)  Oral - Nectar Straw (None)  Oral - Nectar Syringe (None)  Oral - Ice Chips (None)  Oral - Thin Teaspoon (None)  Oral - Thin Cup (None)  Oral - Thin Straw (None)  Oral - Thin Syringe (None)  Oral - Puree (None)  Oral - Mechanical Soft (None)  Oral - Regular (None)  Oral - Multi-consistency (None)  Oral - Pill (None)  Oral Phase - Comment (None)      CHL IP PHARYNGEAL PHASE 12/04/2014  Pharyngeal Phase Impaired  Pharyngeal - Pudding Teaspoon (None)  Penetration/Aspiration details (pudding teaspoon) (None)  Pharyngeal - Pudding Cup (None)  Penetration/Aspiration details (pudding cup) (None)  Pharyngeal - Honey Teaspoon (None)  Penetration/Aspiration details (honey teaspoon) (None)  Pharyngeal - Honey Cup (None)  Penetration/Aspiration details (honey cup) (None)  Pharyngeal - Honey Syringe (None)  Penetration/Aspiration details (honey syringe) (None)  Pharyngeal - Nectar Teaspoon (None)  Penetration/Aspiration details (nectar teaspoon) (None)  Pharyngeal - Nectar Cup (None)  Penetration/Aspiration details (nectar cup) (None)  Pharyngeal - Nectar Straw (None)  Penetration/Aspiration details (nectar straw) (None)  Pharyngeal - Nectar Syringe (None)  Penetration/Aspiration details (nectar syringe) (None)  Pharyngeal - Ice Chips (None)  Penetration/Aspiration details (ice chips) (None)  Pharyngeal - Thin Teaspoon (None)  Penetration/Aspiration details (thin teaspoon) (None)  Pharyngeal - Thin Cup (None)  Penetration/Aspiration details (thin cup) (None)  Pharyngeal - Thin Straw (None)  Penetration/Aspiration details (thin straw) (None)  Pharyngeal - Thin Syringe (None)  Penetration/Aspiration  details (thin syringe') (None)  Pharyngeal - Puree (None)  Penetration/Aspiration details (puree) (None)  Pharyngeal - Mechanical Soft (None)  Penetration/Aspiration details (mechanical soft) (None)  Pharyngeal - Regular (None)  Penetration/Aspiration details (regular) (None)  Pharyngeal - Multi-consistency (None)  Penetration/Aspiration details (multi-consistency) (None)  Pharyngeal - Pill (None)  Penetration/Aspiration details (pill) (None)  Pharyngeal Comment (None)      CHL IP CERVICAL ESOPHAGEAL PHASE 12/04/2014  Cervical Esophageal Phase Impaired  Pudding Teaspoon (  None)  Pudding Cup (None)  Honey Teaspoon (None)  Honey Cup (None)  Honey Straw (None)  Nectar Teaspoon Reduced cricopharyngeal relaxation;Esophageal backflow  into cervical esophagus  Nectar Cup Reduced cricopharyngeal relaxation;Esophageal backflow into  cervical esophagus  Nectar Straw (None)  Nectar Sippy Cup (None)  Thin Teaspoon (None)  Thin Cup Reduced cricopharyngeal relaxation;Esophageal backflow into  cervical esophagus  Thin Straw (None)  Thin Sippy Cup (None)  Cervical Esophageal Comment (None)    No flowsheet data found.         Kern Reap, MA, CCC-SLP 12/04/2014, 1:55 PM [phone]    Review of Systems  Constitutional: Positive for chills and malaise/fatigue. Negative for fever.  Respiratory: Positive for cough, sputum production and shortness of breath.   Cardiovascular: Positive for chest pain (pleuritic) and palpitations.       Atrial fib  Musculoskeletal: Positive for joint pain and falls.       Charcot foot, left great toe amp,  Skin:       Pressure ulcer  Neurological: Positive for weakness.   Blood pressure 126/93, pulse 134, temperature 99.5 F (37.5 C), temperature source Oral, resp. rate 18, height $RemoveBe'5\' 11"'ZYoUnUnVs$  (1.803 m), weight 182 lb (82.555 kg), SpO2 97 %. Physical Exam  Vitals reviewed. Constitutional: He is oriented to person, place, and time. He appears well-developed. No distress.  HENT:  Head:  Normocephalic and atraumatic.  Eyes: Conjunctivae and EOM are normal. No scleral icterus.  Neck: Neck supple. No tracheal deviation present. No thyromegaly present.  Cardiovascular:  No murmur heard. Irregularly, irregular  Respiratory: Effort normal. He has no wheezes.  Diminished BS right base  GI: Soft. There is no tenderness.  Musculoskeletal: He exhibits no edema.  Left great toe amputation  Lymphadenopathy:    He has no cervical adenopathy.  Neurological: He is alert and oriented to person, place, and time. No cranial nerve deficit. Coordination normal.  Skin: Skin is warm and dry.    Assessment/Plan: 69 yo man admitted with pneumonia. He has a complicated para-pneumonic effusion c/w an empyema on CT. Thoracentesis today revealed dark yellow- green fluid.   Based on the appearance of the CT and the post thoracentesis CXR I think the best option for treatment of the effusion is thoracoscopic drainage. I think we are very unlikely to get complete drainage with a pigtail catheter. Of course he needs to continue with appropriate antibiotics as well.  I recommended to Logan May that we proceed with a Bronchoscopy, Right VATS, drainage of empyema, and decortication tomorrow AM. I discussed the general nature of the procedure, the need for general anesthesia, and the incisions to be used with Logan May . I also discussed the expected hospital stay, overall recovery and short and long term outcomes. I reviewed the indications, risks, benefits and alternatives. He understands the risks include, but are not limited to death, stroke, MI, DVT/PE, bleeding, possible need for transfusion, infections, air leaks, as well other unforeseeable complications. He accepts the risks and agrees to proceed.  D/w Dr. Ellouise Newer 12/04/2014, 3:03 PM

## 2014-12-04 NOTE — Progress Notes (Signed)
CRITICAL VALUE ALERT  Critical value received:  Bacteria present in body fluid and ST elevation on EKG  Date of notification:  12/04/2014  Time of notification: 04:50PM  Critical value read back:Yes.    Nurse who received alert:  Alphonsus Sias  MD notified (1st page):  Dr. Grandville Silos  Time of first page:  04:55PM  MD notified (2nd page):  Time of second page:  Responding MD:  Dr. Grandville Silos  Time MD responded:  05PM

## 2014-12-04 NOTE — Progress Notes (Signed)
ANTIBIOTIC CONSULT NOTE - INITIAL  Pharmacy Consult for vancomycin + zosyn Indication: rule out pneumonia  Allergies  Allergen Reactions  . Tetanus Toxoids Other (See Comments)    "Blacked out"    Patient Measurements: Height: 5\' 11"  (180.3 cm) Weight: 182 lb (82.555 kg) IBW/kg (Calculated) : 75.3 Adjusted Body Weight:   Vital Signs: Temp: 99.5 F (37.5 C) (07/30 1332) Temp Source: Oral (07/30 1332) BP: 126/93 mmHg (07/30 1332) Pulse Rate: 134 (07/30 1332) Intake/Output from previous day: 07/29 0701 - 07/30 0700 In: 1756 [I.V.:1500; IV Piggyback:150] Out: 1725 [Urine:1725] Intake/Output from this shift: Total I/O In: 1437.5 [I.V.:1437.5] Out: -   Labs:  Recent Labs  12/02/14 0917 12/03/14 0618 12/04/14 0524 12/04/14 1636  WBC 26.7* 22.3* 18.5* 17.8*  HGB 11.4* 10.6* 10.9* 10.1*  PLT 443* 463* 445* 424*  CREATININE 1.17 0.95 0.63  --    Estimated Creatinine Clearance: 91.5 mL/min (by C-G formula based on Cr of 0.63). No results for input(s): VANCOTROUGH, VANCOPEAK, VANCORANDOM, GENTTROUGH, GENTPEAK, GENTRANDOM, TOBRATROUGH, TOBRAPEAK, TOBRARND, AMIKACINPEAK, AMIKACINTROU, AMIKACIN in the last 72 hours.   Microbiology: Recent Results (from the past 720 hour(s))  Culture, blood (routine x 2)     Status: None (Preliminary result)   Collection Time: 12/02/14  9:17 AM  Result Value Ref Range Status   Specimen Description BLOOD LEFT ANTECUBITAL  Final   Special Requests BOTTLES DRAWN AEROBIC AND ANAEROBIC 5CC  Final   Culture NO GROWTH 2 DAYS  Final   Report Status PENDING  Incomplete  Culture, blood (routine x 2)     Status: None (Preliminary result)   Collection Time: 12/02/14 12:14 PM  Result Value Ref Range Status   Specimen Description BLOOD LEFT ANTECUBITAL  Final   Special Requests BOTTLES DRAWN AEROBIC AND ANAEROBIC 5CC  Final   Culture NO GROWTH 2 DAYS  Final   Report Status PENDING  Incomplete  Clostridium Difficile by PCR (not at [facility])     Status:  None   Collection Time: 12/03/14 11:17 AM  Result Value Ref Range Status   C difficile by pcr NEGATIVE NEGATIVE Final  Culture, Urine     Status: None (Preliminary result)   Collection Time: 12/03/14  3:16 PM  Result Value Ref Range Status   Specimen Description URINE, RANDOM  Final   Special Requests NONE  Final   Culture CULTURE REINCUBATED FOR BETTER GROWTH  Final   Report Status PENDING  Incomplete  Gram stain     Status: None   Collection Time: 12/04/14 12:11 PM  Result Value Ref Range Status   Specimen Description PLEURAL  Final   Special Requests NONE  Final   Gram Stain   Final    ABUNDANT WBC PRESENT,BOTH PMN AND MONONUCLEAR FEW GRAM POSITIVE COCCI IN CHAINS IN CLUSTERS FEW GRAM NEGATIVE COCCI IN CHAINS IN CLUSTERS CRITICAL RESULT CALLED TO, READ BACK BY AND VERIFIED WITH: SCALCO,S RN 12/04/14 1927 [place] CONFIRMED BY Raynelle Fanning    Report Status 12/04/2014 FINAL  Final    Medical History: Past Medical History  Diagnosis Date  . BPH (benign prostatic hypertrophy)   . Prostate cancer     implants   . Vitamin D deficiency     takes Vit D daily  . History of ETT 1999    Dr, Caleen Essex  . Anxiety     takes Valium daily as needed  . Hyperlipidemia     takes Simvastatin nightly  . Hypertension     takes Quinapril and  Procardia daily  . Headache(784.0)     occasionally  . Peripheral neuropathy   . Diabetes mellitus, type 2     but doesn't take any meds for it;diet controlled and exercise  . 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 Pneumonia 1960's?  . CAP (community acquired pneumonia) 12/02/2014    Medications:  Anti-infectives    Start     Dose/Rate Route Frequency Ordered Stop   12/05/14 0900  vancomycin (VANCOCIN) IVPB 1000 mg/200 mL premix     1,000 mg 200 mL/hr over 60 Minutes Intravenous Every 12 hours 12/04/14 2003     12/05/14 0600  vancomycin (VANCOCIN) IVPB 1000 mg/200 mL premix  Status:  Discontinued     1,000 mg 200 mL/hr over 60 Minutes Intravenous  To Surgery 12/04/14 1604 12/04/14 2000   12/04/14 2100  piperacillin-tazobactam (ZOSYN) IVPB 3.375 g     3.375 g 12.5 mL/hr over 240 Minutes Intravenous Every 8 hours 12/04/14 2003     12/04/14 2100  vancomycin (VANCOCIN) 1,500 mg in sodium chloride 0.9 % 500 mL IVPB     1,500 mg 250 mL/hr over 120 Minutes Intravenous  Once 12/04/14 2003     12/03/14 1330  levofloxacin (LEVAQUIN) IVPB 750 mg  Status:  Discontinued     750 mg 100 mL/hr over 90 Minutes Intravenous Every 24 hours 12/02/14 1514 12/04/14 1951   12/02/14 1130  cefTRIAXone (ROCEPHIN) 1 g in dextrose 5 % 50 mL IVPB     1 g 100 mL/hr over 30 Minutes Intravenous  Once 12/02/14 1118 12/02/14 1246   12/02/14 1130  azithromycin (ZITHROMAX) 500 mg in dextrose 5 % 250 mL IVPB     500 mg 250 mL/hr over 60 Minutes Intravenous  Once 12/02/14 1118 12/02/14 1429     Assessment: 25 yom presented with SOB to get VATS tomorrow. Now changing antibiotics to vancomycin + zosyn for pleural fluid gram stain positive for few gram positive coccis and gram negative cocci. Pt is afebrile and WBC is elevated at 17.8. Scr is WNL.   Vanc 7/30>> Zosyn 7/30>> Levaquin 7/29>>7/30 CTX x 1 7/28 Azthro x 1 7/28  7/28 Blood - NGTD 7/29 Urine - NGTD 7/29 Cdiff - NEG 7/30 Pleural fluid - gram stain showed GPC and GNR  Goal of Therapy:  Vancomycin trough level 15-20 mcg/ml  Plan:  - Zosyn 3.375gm IV Q8H (4 hr inf) - DC pre-op vanc for tomorrow - Vanc 1500mg  IV x 1 then 1gm IV Q12H - F/u renal fxn, C&S, clinical status and trough at [place], Rande Lawman 12/04/2014,8:04 PM

## 2014-12-05 ENCOUNTER — Encounter (HOSPITAL_COMMUNITY)
Admission: EM | Disposition: A | Discharge status: Hospice | Payer: Self-pay | Source: Home / Self Care | Attending: Internal Medicine

## 2014-12-05 ENCOUNTER — Inpatient Hospital Stay (HOSPITAL_COMMUNITY): Payer: Medicare Other | Admitting: Certified Registered"

## 2014-12-05 ENCOUNTER — Inpatient Hospital Stay (HOSPITAL_COMMUNITY): Payer: Medicare Other

## 2014-12-05 DIAGNOSIS — J9601 Acute respiratory failure with hypoxia: Secondary | ICD-10-CM

## 2014-12-05 HISTORY — PX: VIDEO ASSISTED THORACOSCOPY (VATS)/EMPYEMA: SHX6172

## 2014-12-05 HISTORY — PX: VIDEO BRONCHOSCOPY: SHX5072

## 2014-12-05 LAB — COMPREHENSIVE METABOLIC PANEL
ALT: 25 U/L (ref 17–63)
AST: 33 U/L (ref 15–41)
Albumin: 1.3 g/dL — ABNORMAL LOW (ref 3.5–5.0)
Alkaline Phosphatase: 73 U/L (ref 38–126)
Anion gap: 7 (ref 5–15)
BUN: 7 mg/dL (ref 6–20)
CALCIUM: 7.4 mg/dL — AB (ref 8.9–10.3)
CHLORIDE: 108 mmol/L (ref 101–111)
CO2: 27 mmol/L (ref 22–32)
CREATININE: 0.55 mg/dL — AB (ref 0.61–1.24)
GFR calc Af Amer: >60 mL/min (ref >60)
Glucose, Bld: 138 mg/dL — ABNORMAL HIGH (ref 65–99)
Potassium: 3.5 mmol/L (ref 3.5–5.1)
Sodium: 142 mmol/L (ref 135–145)
TOTAL PROTEIN: 4.6 g/dL — AB (ref 6.5–8.1)
Total Bilirubin: 0.7 mg/dL (ref 0.3–1.2)

## 2014-12-05 LAB — CBC WITH DIFFERENTIAL/PLATELET
BASOS ABS: 0 10*3/uL (ref 0.0–0.1)
BASOS PCT: 0 % (ref 0–1)
EOS ABS: 0 10*3/uL (ref 0.0–0.7)
Eosinophils Relative: 0 % (ref 0–5)
HCT: 31.1 % — ABNORMAL LOW (ref 39.0–52.0)
HEMOGLOBIN: 9.9 g/dL — AB (ref 13.0–17.0)
Lymphocytes Relative: 8 % — ABNORMAL LOW (ref 12–46)
Lymphs Abs: 1.1 10*3/uL (ref 0.7–4.0)
MCH: 28.6 pg (ref 26.0–34.0)
MCHC: 31.8 g/dL (ref 30.0–36.0)
MCV: 89.9 fL (ref 78.0–100.0)
MONOS PCT: 5 % (ref 3–12)
Monocytes Absolute: 0.7 10*3/uL (ref 0.1–1.0)
Neutro Abs: 12.1 10*3/uL — ABNORMAL HIGH (ref 1.7–7.7)
Neutrophils Relative %: 87 % — ABNORMAL HIGH (ref 43–77)
Platelets: 403 10*3/uL — ABNORMAL HIGH (ref 150–400)
RBC: 3.46 MIL/uL — ABNORMAL LOW (ref 4.22–5.81)
RDW: 14.7 % (ref 11.5–15.5)
WBC: 14 10*3/uL — ABNORMAL HIGH (ref 4.0–10.5)

## 2014-12-05 LAB — GLUCOSE, CAPILLARY
GLUCOSE-CAPILLARY: 114 mg/dL — AB (ref 65–99)
GLUCOSE-CAPILLARY: 204 mg/dL — AB (ref 65–99)
GLUCOSE-CAPILLARY: 241 mg/dL — AB (ref 65–99)
Glucose-Capillary: 104 mg/dL — ABNORMAL HIGH (ref 65–99)
Glucose-Capillary: 159 mg/dL — ABNORMAL HIGH (ref 65–99)

## 2014-12-05 LAB — TROPONIN I

## 2014-12-05 LAB — PROTIME-INR
INR: 1.37 (ref 0.00–1.49)
PROTHROMBIN TIME: 17 s — AB (ref 11.6–15.2)

## 2014-12-05 LAB — POCT I-STAT 3, ART BLOOD GAS (G3+)
Acid-base deficit: 2 mmol/L (ref 0.0–2.0)
Bicarbonate: 22 mEq/L (ref 20.0–24.0)
O2 SAT: 92 %
PH ART: 7.436 (ref 7.350–7.450)
PO2 ART: 61 mmHg — AB (ref 80.0–100.0)
TCO2: 23 mmol/L (ref 0–100)
pCO2 arterial: 32.7 mmHg — ABNORMAL LOW (ref 35.0–45.0)

## 2014-12-05 LAB — LACTATE DEHYDROGENASE, PLEURAL OR PERITONEAL FLUID: LD, Fluid: >10000 U/L — ABNORMAL HIGH (ref 3–23)

## 2014-12-05 LAB — SURGICAL PCR SCREEN
MRSA, PCR: NEGATIVE
Staphylococcus aureus: NEGATIVE

## 2014-12-05 LAB — BODY FLUID CELL COUNT WITH DIFFERENTIAL: Total Nucleated Cell Count, Fluid: 3800 cu mm — ABNORMAL HIGH (ref 0–1000)

## 2014-12-05 LAB — PROTEIN, BODY FLUID: TOTAL PROTEIN, FLUID: 3.3 g/dL

## 2014-12-05 LAB — PH, BODY FLUID: pH, Fluid: 7

## 2014-12-05 LAB — MAGNESIUM: MAGNESIUM: 1.5 mg/dL — AB (ref 1.7–2.4)

## 2014-12-05 SURGERY — VIDEO ASSISTED THORACOSCOPY (VATS)/EMPYEMA
Anesthesia: General | Site: Chest

## 2014-12-05 MED ORDER — FENTANYL 10 MCG/ML IV SOLN
INTRAVENOUS | Status: DC
  Filled 2014-12-05: qty 50

## 2014-12-05 MED ORDER — DILTIAZEM HCL 100 MG IV SOLR
5.0000 mg/h | INTRAVENOUS | Status: DC
  Administered 2014-12-05: 5 mg/h via INTRAVENOUS
  Filled 2014-12-05: qty 100

## 2014-12-05 MED ORDER — MEPERIDINE HCL 25 MG/ML IJ SOLN: 6.2500 mg | INTRAMUSCULAR | Status: DC | PRN

## 2014-12-05 MED ORDER — ALBUTEROL SULFATE (2.5 MG/3ML) 0.083% IN NEBU: 2.5000 mg | INHALATION_SOLUTION | RESPIRATORY_TRACT | Status: DC | PRN

## 2014-12-05 MED ORDER — ONDANSETRON HCL 4 MG/2ML IJ SOLN: 4.0000 mg | Freq: Four times a day (QID) | INTRAMUSCULAR | Status: DC | PRN

## 2014-12-05 MED ORDER — NALOXONE HCL 0.4 MG/ML IJ SOLN: 0.4000 mg | INTRAMUSCULAR | Status: DC | PRN

## 2014-12-05 MED ORDER — ATORVASTATIN CALCIUM 20 MG PO TABS
20.0000 mg | ORAL_TABLET | Freq: Every day | ORAL | Status: DC
  Administered 2014-12-06 – 2014-12-17 (×7): 20 mg via ORAL
  Filled 2014-12-05 (×9): qty 1

## 2014-12-05 MED ORDER — TRAMADOL HCL 50 MG PO TABS: 50.0000 mg | ORAL_TABLET | Freq: Four times a day (QID) | ORAL | Status: DC | PRN

## 2014-12-05 MED ORDER — SIMVASTATIN 40 MG PO TABS: 40.0000 mg | ORAL_TABLET | Freq: Every evening | ORAL | Status: DC

## 2014-12-05 MED ORDER — POTASSIUM CHLORIDE 10 MEQ/50ML IV SOLN
10.0000 meq | Freq: Every day | INTRAVENOUS | Status: DC | PRN
  Administered 2014-12-09 – 2014-12-10 (×6): 10 meq via INTRAVENOUS
  Filled 2014-12-05 (×7): qty 50

## 2014-12-05 MED ORDER — ALBUMIN HUMAN 5 % IV SOLN
INTRAVENOUS | Status: DC | PRN
  Administered 2014-12-05 (×2): via INTRAVENOUS

## 2014-12-05 MED ORDER — VANCOMYCIN HCL IN DEXTROSE 1-5 GM/200ML-% IV SOLN
INTRAVENOUS | Status: AC
  Filled 2014-12-05: qty 200

## 2014-12-05 MED ORDER — SUCCINYLCHOLINE CHLORIDE 20 MG/ML IJ SOLN
INTRAMUSCULAR | Status: AC
  Filled 2014-12-05: qty 1

## 2014-12-05 MED ORDER — HYDROMORPHONE HCL 1 MG/ML IJ SOLN: 0.2500 mg | INTRAMUSCULAR | Status: DC | PRN

## 2014-12-05 MED ORDER — LACTATED RINGERS IV SOLN
INTRAVENOUS | Status: DC | PRN
  Administered 2014-12-05 (×2): via INTRAVENOUS

## 2014-12-05 MED ORDER — MAGNESIUM SULFATE 4 GM/100ML IV SOLN
4.0000 g | Freq: Once | INTRAVENOUS | Status: AC
  Administered 2014-12-05: 4 g via INTRAVENOUS
  Filled 2014-12-05: qty 100

## 2014-12-05 MED ORDER — INSULIN ASPART 100 UNIT/ML ~~LOC~~ SOLN
0.0000 [IU] | Freq: Four times a day (QID) | SUBCUTANEOUS | Status: DC
  Administered 2014-12-05 – 2014-12-06 (×2): 8 [IU] via SUBCUTANEOUS
  Administered 2014-12-06: 2 [IU] via SUBCUTANEOUS

## 2014-12-05 MED ORDER — SODIUM CHLORIDE 0.9 % IJ SOLN: 9.0000 mL | INTRAMUSCULAR | Status: DC | PRN

## 2014-12-05 MED ORDER — DEXTROSE 5 % IV SOLN
30.0000 ug/min | INTRAVENOUS | Status: DC
  Administered 2014-12-05: 70 ug/min via INTRAVENOUS
  Administered 2014-12-05: 50 ug/min via INTRAVENOUS
  Filled 2014-12-05 (×4): qty 1

## 2014-12-05 MED ORDER — PROPOFOL 10 MG/ML IV BOLUS
INTRAVENOUS | Status: AC
  Filled 2014-12-05: qty 20

## 2014-12-05 MED ORDER — ROCURONIUM BROMIDE 50 MG/5ML IV SOLN
INTRAVENOUS | Status: AC
  Filled 2014-12-05: qty 1

## 2014-12-05 MED ORDER — DEXMEDETOMIDINE HCL IN NACL 200 MCG/50ML IV SOLN
0.4000 ug/kg/h | INTRAVENOUS | Status: DC
  Administered 2014-12-05: 0.7 ug/kg/h via INTRAVENOUS

## 2014-12-05 MED ORDER — NOREPINEPHRINE BITARTRATE 1 MG/ML IV SOLN
2.0000 ug/min | INTRAVENOUS | Status: DC
  Filled 2014-12-05: qty 4

## 2014-12-05 MED ORDER — PROPOFOL 10 MG/ML IV BOLUS
INTRAVENOUS | Status: DC | PRN
  Administered 2014-12-05: 50 mg via INTRAVENOUS
  Administered 2014-12-05: 100 mg via INTRAVENOUS

## 2014-12-05 MED ORDER — ROCURONIUM BROMIDE 100 MG/10ML IV SOLN
INTRAVENOUS | Status: DC | PRN
  Administered 2014-12-05: 10 mg via INTRAVENOUS
  Administered 2014-12-05: 50 mg via INTRAVENOUS
  Administered 2014-12-05: 10 mg via INTRAVENOUS
  Administered 2014-12-05: 20 mg via INTRAVENOUS

## 2014-12-05 MED ORDER — GLYCOPYRROLATE 0.2 MG/ML IJ SOLN
INTRAMUSCULAR | Status: DC | PRN
  Administered 2014-12-05: 0.6 mg via INTRAVENOUS

## 2014-12-05 MED ORDER — OXYCODONE HCL 5 MG/5ML PO SOLN
5.0000 mg | Freq: Once | ORAL | Status: DC | PRN
  Filled 2014-12-05: qty 5

## 2014-12-05 MED ORDER — ACETAMINOPHEN 500 MG PO TABS
1000.0000 mg | ORAL_TABLET | Freq: Four times a day (QID) | ORAL | Status: AC
Start: 2014-12-05 — End: 2014-12-10
  Administered 2014-12-06 (×2): 1000 mg via ORAL
  Filled 2014-12-05 (×16): qty 2

## 2014-12-05 MED ORDER — PANTOPRAZOLE SODIUM 40 MG PO TBEC: 40.0000 mg | DELAYED_RELEASE_TABLET | Freq: Every day | ORAL | Status: DC

## 2014-12-05 MED ORDER — ALBUMIN HUMAN 5 % IV SOLN
INTRAVENOUS | Status: AC
  Filled 2014-12-05: qty 250

## 2014-12-05 MED ORDER — FENTANYL CITRATE (PF) 100 MCG/2ML IJ SOLN
50.0000 ug | INTRAMUSCULAR | Status: DC | PRN
  Filled 2014-12-05: qty 2

## 2014-12-05 MED ORDER — FENTANYL CITRATE (PF) 250 MCG/5ML IJ SOLN
INTRAMUSCULAR | Status: AC
  Filled 2014-12-05: qty 5

## 2014-12-05 MED ORDER — CETYLPYRIDINIUM CHLORIDE 0.05 % MT LIQD
7.0000 mL | Freq: Four times a day (QID) | OROMUCOSAL | Status: DC
  Administered 2014-12-06 – 2014-12-07 (×8): 7 mL via OROMUCOSAL

## 2014-12-05 MED ORDER — BISACODYL 5 MG PO TBEC
10.0000 mg | DELAYED_RELEASE_TABLET | Freq: Every day | ORAL | Status: DC
  Administered 2014-12-06 – 2014-12-11 (×2): 10 mg via ORAL
  Filled 2014-12-05 (×2): qty 2

## 2014-12-05 MED ORDER — FENTANYL CITRATE (PF) 100 MCG/2ML IJ SOLN
INTRAMUSCULAR | Status: DC | PRN
  Administered 2014-12-05: 25 ug via INTRAVENOUS
  Administered 2014-12-05: 100 ug via INTRAVENOUS
  Administered 2014-12-05 (×2): 50 ug via INTRAVENOUS
  Administered 2014-12-05: 25 ug via INTRAVENOUS

## 2014-12-05 MED ORDER — ACETAMINOPHEN 160 MG/5ML PO SOLN
1000.0000 mg | Freq: Four times a day (QID) | ORAL | Status: AC
  Administered 2014-12-06 – 2014-12-10 (×8): 1000 mg via ORAL
  Filled 2014-12-05 (×7): qty 40.6

## 2014-12-05 MED ORDER — DEXMEDETOMIDINE HCL IN NACL 200 MCG/50ML IV SOLN
INTRAVENOUS | Status: AC
  Filled 2014-12-05: qty 50

## 2014-12-05 MED ORDER — DEXMEDETOMIDINE HCL IN NACL 200 MCG/50ML IV SOLN
0.0000 ug/kg/h | INTRAVENOUS | Status: DC
  Filled 2014-12-05: qty 50

## 2014-12-05 MED ORDER — OXYCODONE HCL 5 MG PO TABS: 5.0000 mg | ORAL_TABLET | ORAL | Status: DC | PRN

## 2014-12-05 MED ORDER — CHLORHEXIDINE GLUCONATE 0.12 % MT SOLN
15.0000 mL | Freq: Two times a day (BID) | OROMUCOSAL | Status: DC
  Administered 2014-12-05 – 2014-12-07 (×6): 15 mL via OROMUCOSAL
  Filled 2014-12-05 (×5): qty 15

## 2014-12-05 MED ORDER — SENNOSIDES-DOCUSATE SODIUM 8.6-50 MG PO TABS
1.0000 | ORAL_TABLET | Freq: Every day | ORAL | Status: DC
  Administered 2014-12-05 – 2014-12-17 (×3): 1 via ORAL
  Filled 2014-12-05 (×9): qty 1

## 2014-12-05 MED ORDER — FAMOTIDINE IN NACL 20-0.9 MG/50ML-% IV SOLN
20.0000 mg | Freq: Two times a day (BID) | INTRAVENOUS | Status: DC
  Administered 2014-12-05 – 2014-12-10 (×10): 20 mg via INTRAVENOUS
  Filled 2014-12-05 (×10): qty 50

## 2014-12-05 MED ORDER — OXYCODONE HCL 5 MG/5ML PO SOLN: 5.0000 mg | Freq: Once | ORAL | Status: DC | PRN

## 2014-12-05 MED ORDER — OXYCODONE HCL 5 MG PO TABS: 5.0000 mg | ORAL_TABLET | Freq: Once | ORAL | Status: DC | PRN

## 2014-12-05 MED ORDER — SODIUM CHLORIDE 0.9 % IV SOLN: Freq: Once | INTRAVENOUS | Status: DC

## 2014-12-05 MED ORDER — 0.9 % SODIUM CHLORIDE (POUR BTL) OPTIME
TOPICAL | Status: DC | PRN
  Administered 2014-12-05: 1000 mL

## 2014-12-05 MED ORDER — KCL IN DEXTROSE-NACL 20-5-0.9 MEQ/L-%-% IV SOLN
INTRAVENOUS | Status: DC
Start: 2014-12-05 — End: 2014-12-06
  Administered 2014-12-05: 100 mL/h via INTRAVENOUS
  Filled 2014-12-05 (×5): qty 1000

## 2014-12-05 MED ORDER — IPRATROPIUM-ALBUTEROL 0.5-2.5 (3) MG/3ML IN SOLN
3.0000 mL | Freq: Four times a day (QID) | RESPIRATORY_TRACT | Status: DC
  Administered 2014-12-05 – 2014-12-12 (×27): 3 mL via RESPIRATORY_TRACT
  Filled 2014-12-05 (×29): qty 3

## 2014-12-05 MED ORDER — MEPERIDINE HCL 25 MG/ML IJ SOLN
6.2500 mg | INTRAMUSCULAR | Status: DC | PRN
  Filled 2014-12-05: qty 1

## 2014-12-05 MED ORDER — DIPHENHYDRAMINE HCL 50 MG/ML IJ SOLN: 12.5000 mg | Freq: Four times a day (QID) | INTRAMUSCULAR | Status: DC | PRN

## 2014-12-05 MED ORDER — FENTANYL CITRATE (PF) 100 MCG/2ML IJ SOLN: 50.0000 ug | INTRAMUSCULAR | Status: DC | PRN

## 2014-12-05 MED ORDER — NEOSTIGMINE METHYLSULFATE 10 MG/10ML IV SOLN
INTRAVENOUS | Status: DC | PRN
  Administered 2014-12-05: 4 mg via INTRAVENOUS

## 2014-12-05 MED ORDER — DEXTROSE 5 % IV SOLN
10.0000 mg | INTRAVENOUS | Status: DC | PRN
  Administered 2014-12-05: 10 ug/min via INTRAVENOUS

## 2014-12-05 MED ORDER — DIPHENHYDRAMINE HCL 12.5 MG/5ML PO ELIX
12.5000 mg | ORAL_SOLUTION | Freq: Four times a day (QID) | ORAL | Status: DC | PRN
  Filled 2014-12-05: qty 5

## 2014-12-05 MED ORDER — LIDOCAINE HCL (CARDIAC) 20 MG/ML IV SOLN
INTRAVENOUS | Status: DC | PRN
  Administered 2014-12-05: 50 mg via INTRAVENOUS

## 2014-12-05 MED ORDER — MIDAZOLAM HCL 2 MG/2ML IJ SOLN
INTRAMUSCULAR | Status: AC
  Filled 2014-12-05: qty 2

## 2014-12-05 MED ORDER — ALBUMIN HUMAN 5 % IV SOLN
12.5000 g | Freq: Once | INTRAVENOUS | Status: AC
  Administered 2014-12-05: 12.5 g via INTRAVENOUS

## 2014-12-05 MED ORDER — SODIUM CHLORIDE 0.9 % IV BOLUS (SEPSIS)
500.0000 mL | Freq: Once | INTRAVENOUS | Status: AC
  Administered 2014-12-05: 500 mL via INTRAVENOUS

## 2014-12-05 MED ORDER — PHENYLEPHRINE HCL 10 MG/ML IJ SOLN
30.0000 ug/min | INTRAVENOUS | Status: DC
  Administered 2014-12-05: 80 ug/min via INTRAVENOUS
  Administered 2014-12-06: 75 ug/min via INTRAVENOUS
  Administered 2014-12-06 – 2014-12-07 (×2): 65 ug/min via INTRAVENOUS
  Filled 2014-12-05 (×4): qty 4

## 2014-12-05 MED ORDER — FENTANYL CITRATE (PF) 2500 MCG/50ML IJ SOLN
25.0000 ug/h | INTRAMUSCULAR | Status: DC
  Administered 2014-12-05: 100 ug/h via INTRAVENOUS
  Administered 2014-12-06: 200 ug/h via INTRAVENOUS
  Administered 2014-12-07: 160 ug/h via INTRAVENOUS
  Filled 2014-12-05 (×3): qty 50

## 2014-12-05 MED ORDER — POTASSIUM CHLORIDE CRYS ER 20 MEQ PO TBCR: 40.0000 meq | EXTENDED_RELEASE_TABLET | Freq: Once | ORAL | Status: DC

## 2014-12-05 SURGICAL SUPPLY — 91 items
ADH SKN CLS APL DERMABOND .7 (GAUZE/BANDAGES/DRESSINGS)
BAG SPEC RTRVL LRG 6X4 10 (ENDOMECHANICALS)
BALL CTTN LRG ABS STRL LF (GAUZE/BANDAGES/DRESSINGS)
BLOCK BITE 60FR ADLT L/F BLUE (MISCELLANEOUS) ×2 IMPLANT
BRUSH CYTOL CELLEBRITY 1.5X140 (MISCELLANEOUS) IMPLANT
CANISTER SUCTION 2500CC (MISCELLANEOUS) ×6 IMPLANT
CATH KIT ON Q 5IN SLV (PAIN MANAGEMENT) IMPLANT
CATH THORACIC 28FR (CATHETERS) IMPLANT
CATH THORACIC 36FR (CATHETERS) IMPLANT
CATH THORACIC 36FR RT ANG (CATHETERS) IMPLANT
CLIP TI MEDIUM 6 (CLIP) ×4 IMPLANT
CONN ST 1/4X3/8  BEN (MISCELLANEOUS) ×2
CONN ST 1/4X3/8 BEN (MISCELLANEOUS) IMPLANT
CONN Y 3/8X3/8X3/8  BEN (MISCELLANEOUS) ×2
CONN Y 3/8X3/8X3/8 BEN (MISCELLANEOUS) IMPLANT
CONT SPEC 4OZ CLIKSEAL STRL BL (MISCELLANEOUS) ×20 IMPLANT
COTTONBALL LRG STERILE PKG (GAUZE/BANDAGES/DRESSINGS) IMPLANT
COVER TABLE BACK 60X90 (DRAPES) ×2 IMPLANT
DERMABOND ADVANCED (GAUZE/BANDAGES/DRESSINGS)
DERMABOND ADVANCED .7 DNX12 (GAUZE/BANDAGES/DRESSINGS) IMPLANT
DRAIN CHANNEL 28F RND 3/8 FF (WOUND CARE) IMPLANT
DRAIN CHANNEL 32F RND 10.7 FF (WOUND CARE) IMPLANT
DRAPE LAPAROSCOPIC ABDOMINAL (DRAPES) ×4 IMPLANT
DRAPE WARM FLUID 44X44 (DRAPE) ×6 IMPLANT
ELECT BLADE 6.5 EXT (BLADE) ×4 IMPLANT
ELECT REM PT RETURN 9FT ADLT (ELECTROSURGICAL) ×4
ELECTRODE REM PT RTRN 9FT ADLT (ELECTROSURGICAL) ×2 IMPLANT
FILTER STRAW FLUID ASPIR (MISCELLANEOUS) IMPLANT
FORCEPS BIOP RJ4 1.8 (CUTTING FORCEPS) IMPLANT
GAUZE SPONGE 4X4 12PLY STRL (GAUZE/BANDAGES/DRESSINGS) ×4 IMPLANT
GLOVE SURG SIGNA 7.5 PF LTX (GLOVE) ×10 IMPLANT
GOWN STRL REUS W/ TWL LRG LVL3 (GOWN DISPOSABLE) ×4 IMPLANT
GOWN STRL REUS W/ TWL XL LVL3 (GOWN DISPOSABLE) ×4 IMPLANT
GOWN STRL REUS W/TWL LRG LVL3 (GOWN DISPOSABLE) ×12
GOWN STRL REUS W/TWL XL LVL3 (GOWN DISPOSABLE) ×4
KIT BASIN OR (CUSTOM PROCEDURE TRAY) ×4 IMPLANT
KIT CLEAN ENDO COMPLIANCE (KITS) ×4 IMPLANT
KIT ROOM TURNOVER OR (KITS) ×4 IMPLANT
KIT SUCTION CATH 14FR (SUCTIONS) ×4 IMPLANT
NDL BIOPSY TRANSBRONCH 21G (NEEDLE) IMPLANT
NEEDLE 22X1 1/2 (OR ONLY) (NEEDLE) IMPLANT
NEEDLE BIOPSY TRANSBRONCH 21G (NEEDLE) IMPLANT
NS IRRIG 1000ML POUR BTL (IV SOLUTION) ×12 IMPLANT
PACK CHEST (CUSTOM PROCEDURE TRAY) ×4 IMPLANT
PAD ARMBOARD 7.5X6 YLW CONV (MISCELLANEOUS) ×8 IMPLANT
POUCH ENDO CATCH II 15MM (MISCELLANEOUS) IMPLANT
POUCH SPECIMEN RETRIEVAL 10MM (ENDOMECHANICALS) IMPLANT
SEALANT PROGEL (MISCELLANEOUS) IMPLANT
SEALANT SURG COSEAL 4ML (VASCULAR PRODUCTS) IMPLANT
SEALANT SURG COSEAL 8ML (VASCULAR PRODUCTS) IMPLANT
SOLUTION ANTI FOG 6CC (MISCELLANEOUS) ×6 IMPLANT
SPECIMEN JAR MEDIUM (MISCELLANEOUS) ×4 IMPLANT
SPONGE GAUZE 4X4 12PLY STER LF (GAUZE/BANDAGES/DRESSINGS) ×2 IMPLANT
SPONGE INTESTINAL PEANUT (DISPOSABLE) ×8 IMPLANT
SPONGE LAP 18X18 X RAY DECT (DISPOSABLE) ×2 IMPLANT
SUT PROLENE 4 0 RB 1 (SUTURE)
SUT PROLENE 4-0 RB1 .5 CRCL 36 (SUTURE) IMPLANT
SUT SILK  1 MH (SUTURE) ×6
SUT SILK 1 MH (SUTURE) ×2 IMPLANT
SUT SILK 1 TIES 10X30 (SUTURE) ×4 IMPLANT
SUT SILK 2 0SH CR/8 30 (SUTURE) IMPLANT
SUT SILK 3 0SH CR/8 30 (SUTURE) IMPLANT
SUT VIC AB 0 CTX 27 (SUTURE) IMPLANT
SUT VIC AB 1 CTX 27 (SUTURE) ×2 IMPLANT
SUT VIC AB 2-0 CT1 27 (SUTURE) ×8
SUT VIC AB 2-0 CT1 TAPERPNT 27 (SUTURE) IMPLANT
SUT VIC AB 2-0 CTX 36 (SUTURE) IMPLANT
SUT VIC AB 3-0 MH 27 (SUTURE) IMPLANT
SUT VIC AB 3-0 SH 27 (SUTURE)
SUT VIC AB 3-0 SH 27X BRD (SUTURE) IMPLANT
SUT VIC AB 3-0 X1 27 (SUTURE) ×8 IMPLANT
SUT VICRYL 0 UR6 27IN ABS (SUTURE) ×8 IMPLANT
SUT VICRYL 2 TP 1 (SUTURE) ×2 IMPLANT
SWAB COLLECTION DEVICE MRSA (MISCELLANEOUS) IMPLANT
SYR 20ML ECCENTRIC (SYRINGE) ×4 IMPLANT
SYR 5ML LL (SYRINGE) ×4 IMPLANT
SYR 5ML LUER SLIP (SYRINGE) ×2 IMPLANT
SYR CONTROL 10ML LL (SYRINGE) IMPLANT
SYSTEM SAHARA CHEST DRAIN ATS (WOUND CARE) ×4 IMPLANT
TAPE CLOTH SURG 4X10 WHT LF (GAUZE/BANDAGES/DRESSINGS) ×2 IMPLANT
TIP APPLICATOR SPRAY EXTEND 16 (VASCULAR PRODUCTS) IMPLANT
TOWEL OR 17X24 6PK STRL BLUE (TOWEL DISPOSABLE) ×4 IMPLANT
TOWEL OR 17X26 10 PK STRL BLUE (TOWEL DISPOSABLE) ×8 IMPLANT
TRAP SPECIMEN MUCOUS 40CC (MISCELLANEOUS) ×10 IMPLANT
TRAY FOLEY CATH 16FRSI W/METER (SET/KITS/TRAYS/PACK) ×4 IMPLANT
TROCAR XCEL BLADELESS 5X75MML (TROCAR) ×4 IMPLANT
TUBE ANAEROBIC SPECIMEN COL (MISCELLANEOUS) ×2 IMPLANT
TUBE CONNECTING 20'X1/4 (TUBING) ×1
TUBE CONNECTING 20X1/4 (TUBING) ×3 IMPLANT
TUNNELER SHEATH ON-Q 11GX8 DSP (PAIN MANAGEMENT) IMPLANT
WATER STERILE IRR 1000ML POUR (IV SOLUTION) ×4 IMPLANT

## 2014-12-05 NOTE — Brief Op Note (Addendum)
12/02/2014 - 12/05/2014  12:45 PM  PATIENT:  Logan May  69 y.o. male  PRE-OPERATIVE DIAGNOSIS:  1. Right empyema   POST-OPERATIVE DIAGNOSIS:  1. Right empyema    PROCEDURE VIDEO BRONCHOSCOPY RIGHT VIDEO ASSISTED THORACOSCOPY (VATS) DRAINAGE of RIGHT EMPYEMA VISCERAL AND PARIETAL DECORTICATION  SURGEON:  Surgeon(s) and Role:    * Melrose Nakayama, MD - Primary  PHYSICIAN ASSISTANT: Lars Pinks PA-C  ANESTHESIA:   general  EBL:  Total I/O In: 1500 [I.V.:1000; IV Piggyback:500] Out: 500 [Blood:500]  BLOOD ADMINISTERED:none  DRAINS: 2 15 Blake drains a 37 French chest tube placed in the right pleural space   SPECIMEN:  Source of Specimen:  Right pleural fluid, right visceral pleura  DISPOSITION OF SPECIMEN:  Culture and pathology  COUNTS CORRECT:  YES  PLAN OF CARE: Admit to inpatient   PATIENT DISPOSITION:  PACU - hemodynamically stable.   FINDINGS: Bronch- extrinsic compression RML and RLL airways  Large organizing empyema with pockets of frankly purulent fluid  Good re-expansion post decortication   Delay start of Pharmacological VTE agent (>24hrs) due to surgical blood loss or risk of bleeding: yes

## 2014-12-05 NOTE — Progress Notes (Signed)
OT Cancellation Note  Patient Details Name: Logan May MRN: [id number] DOB: 06-11-1945   Cancelled Treatment:    Reason Eval/Treat Not Completed: Patient at procedure or test/ unavailable. Pt currently in OR per chart. Will re-attempt eval tomorrow.  Almon Register [phone] 12/05/2014, 12:38 PM

## 2014-12-05 NOTE — Progress Notes (Signed)
SLP Cancellation Note  Patient Details Name: Logan May MRN: [id number] DOB: 1946/05/01   Cancelled treatment:       Reason Eval/Treat Not Completed: Medical issues which prohibited therapy (pt NPO for procedure this morning). Will f/u as able.   Germain Osgood, M.A. CCC-SLP [phone]  Germain Osgood 12/05/2014, 9:27 AM

## 2014-12-05 NOTE — Anesthesia Postprocedure Evaluation (Signed)
  Anesthesia Post-op Note  Patient: Logan May  Procedure(s) Performed: Procedure(s): VIDEO ASSISTED THORACOSCOPY (VATS)/EMPYEMA (N/A) VIDEO BRONCHOSCOPY (N/A)  Patient Location: PACU  Anesthesia Type:General  Level of Consciousness: sedated and Patient remains intubated per anesthesia plan  Airway and Oxygen Therapy: Patient remains intubated per anesthesia plan and Patient placed on Ventilator (see vital sign flow sheet for setting)  Post-op Pain: none  Post-op Assessment: Post-op Vital signs reviewed and Patient's Cardiovascular Status Stable              Post-op Vital Signs: Reviewed  Last Vitals:  Filed Vitals:   12/05/14 1404  BP: 112/73  Pulse: 58  Temp:   Resp: 24    Complications: No apparent anesthesia complications.  Pt unable to be extubated at end of case due to underlying lung disease.

## 2014-12-05 NOTE — Progress Notes (Addendum)
[facility]  Called by nurse for bradycardia, hypotension  Stat CXR, start levophed, get stat EKG, stat ABG  Stop precedex, start fentanyl gtt  Roselie Awkward, MD [facility] Pager: [phone] Cell: [phone] After 3pm or if no response, call [phone]

## 2014-12-05 NOTE — Transfer of Care (Signed)
Immediate Anesthesia Transfer of Care Note  Patient: Logan May  Procedure(s) Performed: Procedure(s): VIDEO ASSISTED THORACOSCOPY (VATS)/EMPYEMA (N/A) VIDEO BRONCHOSCOPY (N/A)  Patient Location: PACU  Anesthesia Type:General  Level of Consciousness: sedated  Airway & Oxygen Therapy: Patient remains intubated per anesthesia plan and Patient placed on Ventilator (see vital sign flow sheet for setting)  Post-op Assessment: Report given to RN  Post vital signs: stable  Last Vitals:  Filed Vitals:   12/05/14 0530  BP: 117/62  Pulse: 94  Temp: 37 C  Resp: 18    Complications: No apparent anesthesia complications

## 2014-12-05 NOTE — Consult Note (Signed)
Urology Consult  Referring physician:   Dr. Merilynn Finland Reason for referral:  Acute Urinary retention  Chief Complaint: cannot pass czatheter History of Present Illness:  Consulted by Dr.l Roxan Hockey post op in ICU re: inability to pass foley catheter intra-op for urinary monitoring. Remote hx of CaP, Rx I125 seed. Pt intubated. No family available for hx or ROS.   Past Medical History  Diagnosis Date  . BPH (benign prostatic hypertrophy)   . Prostate cancer     implants   . Vitamin D deficiency     takes Vit D daily  . History of ETT 1999    Dr, Caleen Essex  . Anxiety     takes Valium daily as needed  . Hyperlipidemia     takes Simvastatin nightly  . Hypertension     takes Quinapril and Procardia daily  . Headache(784.0)     occasionally  . Peripheral neuropathy   . Diabetes mellitus, type 2     but doesn't take any meds for it;diet controlled and exercise  . 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 Pneumonia 1960's?  . CAP (community acquired pneumonia) 12/02/2014   Past Surgical History  Procedure Laterality Date  . Foot surgery  May 2002    Dr. Sharol Given   . Left foot casted 4-5 months    . Bilateral  eye laser surgery Bilateral 2000    "related to diabetes"  . Charcot foot left Left 12/15/01  . Laser eye surgery right  12/2002  . Eye surgery    . Tonsillectomy    . May&d extremity Left 09/05/2012    Procedure: IRRIGATION AND DEBRIDEMENT EXTREMITY;  Surgeon: Newt Minion, MD;  Location: [facility];  Service: Orthopedics;  Laterality: Left;  Excision Charcot Collapse Left Foot and Base 5th Metatarsal, Antibiotic Beads  . Esophagogastroduodenoscopy      at least 18yr ago  . Amputation Left 05/20/2013    Procedure: AMPUTATION DIGIT;  Surgeon: MNewt Minion MD;  Location: [facility]  Service: Orthopedics;  Laterality: Left;  Left Great Toe Amputation at [facility]    Medications: May have reviewed the patient's current medications. Allergies:  Allergies  Allergen Reactions  . Tetanus Toxoids  Other (See Comments)    "Blacked out"    History reviewed. No pertinent family history. Social History:  reports that he has never smoked. He has never used smokeless tobacco. He reports that he drinks about 25.2 oz of alcohol per week. He reports that he does not use illicit drugs.  ROS: Constitutional: Positive for chills and malaise/fatigue. Negative for fever.  Respiratory: Positive for cough, sputum production and shortness of breath.  Cardiovascular: Positive for chest pain (pleuritic) and palpitations.   Atrial fib  Musculoskeletal: Positive for joint pain and falls.   Charcot foot, left great toe amp,  Skin:   Pressure ulcer  Neurological: Positive for weakness.    Physical Exam:  Intubated.  Vital signs in last 24 hours: Temp:  [98 F (36.7 C)-99.1 F (37.3 C)] 98 F (36.7 C) (07/31 1415) Pulse Rate:  [58-141] 99 (07/31 1415) Resp:  [10-25] 19 (07/31 1415) BP: (110-147)/(58-82) 112/73 mmHg (07/31 1404) SpO2:  [91 %-100 %] 97 % (07/31 1415) Arterial Line BP: (113-156)/(45-59) 113/45 mmHg (07/31 1415) FiO2 (%):  [1 %-100 %] 1 % (07/31 1320)  Cardiovascular: Skin warm; not flushed Respiratory: Breaths quiet; no shortness of breath Abdomen: No masses Neurological: Normal sensation to touch Musculoskeletal: Normal motor function arms and legs Lymphatics: No inguinal  adenopathy Skin: No rashes Genitourinary: uncircumcised. Testes desxcended bilaterally,.   Laboratory Data:  Results for orders placed or performed during the hospital encounter of 12/02/14 (from the past 72 hour(s))  Legionella antigen, urine  (not at [facility])     Status: None   Collection Time: 12/02/14  4:36 PM  Result Value Ref Range   Specimen Description URINE, RANDOM    Special Requests NONE    Legionella Antigen, Urine      Negative for Legionella pneumophila serogroup 1                                                              Legionella pneumophila serogroup 1 antigen can be  detected in urine within 2 to 3 days of infection and may persist even after treatment. This  assay does not detect other Legionella species or serogroups. Performed at [facility]    Report Status 12/03/2014 FINAL   Strep pneumoniae urinary antigen  (not at [facility])     Status: None   Collection Time: 12/02/14  4:36 PM  Result Value Ref Range   Strep Pneumo Urinary Antigen NEGATIVE NEGATIVE    Comment:        Infection due to S. pneumoniae cannot be absolutely ruled out since the antigen present may be below the detection limit of the test.   Glucose, capillary     Status: Abnormal   Collection Time: 12/02/14  4:51 PM  Result Value Ref Range   Glucose-Capillary 196 (H) 65 - 99 mg/dL  HIV antibody     Status: None   Collection Time: 12/02/14  5:15 PM  Result Value Ref Range   HIV Screen 4th Generation wRfx Non Reactive Non Reactive    Comment: (NOTE) Performed At: [facility] [place], [place] [id number] Lindon Romp MD UG:[id number]   Hemoglobin A1c     Status: Abnormal   Collection Time: 12/02/14  5:15 PM  Result Value Ref Range   Hgb A1c MFr Bld 6.9 (H) 4.8 - 5.6 %    Comment: (NOTE)         Pre-diabetes: 5.7 - 6.4         Diabetes: >6.4         Glycemic control for adults with diabetes: <7.0    Mean Plasma Glucose 151 mg/dL    Comment: (NOTE) Performed At: [facility] [place], [place] [id number] Lindon Romp MD ZP:[id number]   Glucose, capillary     Status: Abnormal   Collection Time: 12/02/14 10:19 PM  Result Value Ref Range   Glucose-Capillary 145 (H) 65 - 99 mg/dL  CBC     Status: Abnormal   Collection Time: 12/03/14  6:18 AM  Result Value Ref Range   WBC 22.3 (H) 4.0 - 10.5 K/uL   RBC 3.61 (L) 4.22 - 5.81 MIL/uL   Hemoglobin 10.6 (L) 13.0 - 17.0 g/dL   HCT 32.6 (L) 39.0 - 52.0 %   MCV 90.3 78.0 - 100.0 fL   MCH 29.4 26.0 - 34.0 pg   MCHC 32.5 30.0 - 36.0 g/dL   RDW 14.2 11.5 - 15.5 %    Platelets 463 (H) 150 - 400 K/uL  Basic metabolic panel     Status: Abnormal  Collection Time: 12/03/14  6:18 AM  Result Value Ref Range   Sodium 139 135 - 145 mmol/L   Potassium 3.2 (L) 3.5 - 5.1 mmol/L   Chloride 104 101 - 111 mmol/L   CO2 21 (L) 22 - 32 mmol/L   Glucose, Bld 220 (H) 65 - 99 mg/dL   BUN 20 6 - 20 mg/dL   Creatinine, Ser 0.95 0.61 - 1.24 mg/dL   Calcium 7.4 (L) 8.9 - 10.3 mg/dL   GFR calc non Af Amer >60 >60 mL/min   GFR calc Af Amer >60 >60 mL/min    Comment: (NOTE) The eGFR has been calculated using the CKD EPI equation. This calculation has not been validated in all clinical situations. eGFR's persistently <60 mL/min signify possible Chronic Kidney Disease.    Anion gap 14 5 - 15  Magnesium     Status: Abnormal   Collection Time: 12/03/14  6:18 AM  Result Value Ref Range   Magnesium 1.5 (L) 1.7 - 2.4 mg/dL  Hemoglobin A1c     Status: Abnormal   Collection Time: 12/03/14  6:18 AM  Result Value Ref Range   Hgb A1c MFr Bld 6.8 (H) 4.8 - 5.6 %    Comment: (NOTE)         Pre-diabetes: 5.7 - 6.4         Diabetes: >6.4         Glycemic control for adults with diabetes: <7.0    Mean Plasma Glucose 148 mg/dL    Comment: (NOTE) Performed At: [facility] [place], [place] [id number] Lindon Romp MD IH:[id number]   Glucose, capillary     Status: Abnormal   Collection Time: 12/03/14  7:58 AM  Result Value Ref Range   Glucose-Capillary 169 (H) 65 - 99 mg/dL  Clostridium Difficile by PCR (not at [facility])     Status: None   Collection Time: 12/03/14 11:17 AM  Result Value Ref Range   C difficile by pcr NEGATIVE NEGATIVE  Glucose, capillary     Status: Abnormal   Collection Time: 12/03/14 12:39 PM  Result Value Ref Range   Glucose-Capillary 510 (H) 65 - 99 mg/dL  Glucose, capillary     Status: Abnormal   Collection Time: 12/03/14  2:10 PM  Result Value Ref Range   Glucose-Capillary 220 (H) 65 - 99 mg/dL  Urinalysis, Routine w reflex  microscopic (not at [facility])     Status: Abnormal   Collection Time: 12/03/14  3:16 PM  Result Value Ref Range   Color, Urine YELLOW YELLOW   APPearance CLEAR CLEAR   Specific Gravity, Urine 1.016 1.005 - 1.030   pH 5.5 5.0 - 8.0   Glucose, UA NEGATIVE NEGATIVE mg/dL   Hgb urine dipstick NEGATIVE NEGATIVE   Bilirubin Urine NEGATIVE NEGATIVE   Ketones, ur 40 (A) NEGATIVE mg/dL   Protein, ur NEGATIVE NEGATIVE mg/dL   Urobilinogen, UA 1.0 0.0 - 1.0 mg/dL   Nitrite NEGATIVE NEGATIVE   Leukocytes, UA NEGATIVE NEGATIVE    Comment: MICROSCOPIC NOT DONE ON URINES WITH NEGATIVE PROTEIN, BLOOD, LEUKOCYTES, NITRITE, OR GLUCOSE <1000 mg/dL.  Culture, Urine     Status: None (Preliminary result)   Collection Time: 12/03/14  3:16 PM  Result Value Ref Range   Specimen Description URINE, RANDOM    Special Requests NONE    Culture 30,000 COLONIES/mL ENTEROCOCCUS SPECIES    Report Status PENDING   Glucose, capillary     Status: Abnormal   Collection Time: 12/03/14  5:19 PM  Result Value Ref Range   Glucose-Capillary 130 (H) 65 - 99 mg/dL  Glucose, capillary     Status: None   Collection Time: 12/03/14  9:44 PM  Result Value Ref Range   Glucose-Capillary 66 65 - 99 mg/dL  Glucose, capillary     Status: Abnormal   Collection Time: 12/04/14 12:14 AM  Result Value Ref Range   Glucose-Capillary 122 (H) 65 - 99 mg/dL  Glucose, capillary     Status: Abnormal   Collection Time: 12/04/14  4:25 AM  Result Value Ref Range   Glucose-Capillary 154 (H) 65 - 99 mg/dL  CBC with Differential/Platelet     Status: Abnormal   Collection Time: 12/04/14  5:24 AM  Result Value Ref Range   WBC 18.5 (H) 4.0 - 10.5 K/uL   RBC 3.79 (L) 4.22 - 5.81 MIL/uL   Hemoglobin 10.9 (L) 13.0 - 17.0 g/dL   HCT 33.8 (L) 39.0 - 52.0 %   MCV 89.2 78.0 - 100.0 fL   MCH 28.8 26.0 - 34.0 pg   MCHC 32.2 30.0 - 36.0 g/dL   RDW 14.6 11.5 - 15.5 %   Platelets 445 (H) 150 - 400 K/uL   Neutrophils Relative % 91 (H) 43 - 77 %   Neutro  Abs 16.9 (H) 1.7 - 7.7 K/uL   Lymphocytes Relative 5 (L) 12 - 46 %   Lymphs Abs 0.9 0.7 - 4.0 K/uL   Monocytes Relative 4 3 - 12 %   Monocytes Absolute 0.8 0.1 - 1.0 K/uL   Eosinophils Relative 0 0 - 5 %   Eosinophils Absolute 0.0 0.0 - 0.7 K/uL   Basophils Relative 0 0 - 1 %   Basophils Absolute 0.0 0.0 - 0.1 K/uL  Basic metabolic panel     Status: Abnormal   Collection Time: 12/04/14  5:24 AM  Result Value Ref Range   Sodium 141 135 - 145 mmol/L   Potassium 3.0 (L) 3.5 - 5.1 mmol/L   Chloride 109 101 - 111 mmol/L   CO2 27 22 - 32 mmol/L   Glucose, Bld 166 (H) 65 - 99 mg/dL   BUN 9 6 - 20 mg/dL   Creatinine, Ser 0.63 0.61 - 1.24 mg/dL   Calcium 7.6 (L) 8.9 - 10.3 mg/dL   GFR calc non Af Amer >60 >60 mL/min   GFR calc Af Amer >60 >60 mL/min    Comment: (NOTE) The eGFR has been calculated using the CKD EPI equation. This calculation has not been validated in all clinical situations. eGFR's persistently <60 mL/min signify possible Chronic Kidney Disease.    Anion gap 5 5 - 15  Glucose, capillary     Status: Abnormal   Collection Time: 12/04/14  8:06 AM  Result Value Ref Range   Glucose-Capillary 135 (H) 65 - 99 mg/dL  Magnesium     Status: Abnormal   Collection Time: 12/04/14  8:40 AM  Result Value Ref Range   Magnesium 1.4 (L) 1.7 - 2.4 mg/dL  Glucose, capillary     Status: None   Collection Time: 12/04/14 12:06 PM  Result Value Ref Range   Glucose-Capillary 91 65 - 99 mg/dL  Lactate dehydrogenase (CSF, pleural or peritoneal fluid)     Status: Abnormal   Collection Time: 12/04/14 12:11 PM  Result Value Ref Range   LD, Fluid 6124 (H) 3 - 23 U/L    Comment: RESULTS CONFIRMED BY MANUAL DILUTION (NOTE) Results should be evaluated in conjunction with serum  values    Fluid Type-FLDH PLEURAL     Comment: CORRECTED ON 07/30 AT 1408: PREVIOUSLY REPORTED AS Pleural Fld  Protein, pleural or peritoneal fluid     Status: None   Collection Time: 12/04/14 12:11 PM  Result Value  Ref Range   Total protein, fluid 3.7 g/dL    Comment: (NOTE) No normal range established for this test Results should be evaluated in conjunction with serum values    Fluid Type-FTP PLEURAL     Comment: CORRECTED ON 07/30 AT 1410: PREVIOUSLY REPORTED AS Pleural Fld  Gram stain     Status: None   Collection Time: 12/04/14 12:11 PM  Result Value Ref Range   Specimen Description PLEURAL    Special Requests NONE    Gram Stain      ABUNDANT WBC PRESENT,BOTH PMN AND MONONUCLEAR FEW GRAM POSITIVE COCCI IN CHAINS IN CLUSTERS FEW GRAM NEGATIVE COCCI IN CHAINS IN CLUSTERS CRITICAL RESULT CALLED TO, READ BACK BY AND VERIFIED WITH: SCALCO,S RN 12/04/14 1927 [place] CONFIRMED BY V WILKINS    Report Status 12/04/2014 FINAL   Body fluid cell count with differential     Status: Abnormal   Collection Time: 12/04/14 12:11 PM  Result Value Ref Range   Fluid Type-FCT PLEURAL     Comment: CORRECTED ON 07/30 AT 1408: PREVIOUSLY REPORTED AS Body Fluid   Color, Fluid YELLOW (A) YELLOW   Appearance, Fluid TURBID (A) CLEAR   WBC, Fluid 34500 (H) 0 - 1000 cu mm   Neutrophil Count, Fluid CELLS DEGENERATED, UNABLE TO DIFFERENTIATE. 0 - 25 %    Comment: SLIDES AVAILABLE FOR REVIEW   Lymphs, Fluid CELLS DEGENERATED, UNABLE TO DIFFERENTIATE. %    Comment: SLIDES AVAILABLE FOR REVIEW.   Monocyte-Macrophage-Serous Fluid CELLS DEGENERATED, UNABLE TO DIFFERENTIATE. 50 - 90 %    Comment: SLIDES AVAILABLE FOR REVIEW   Eos, Fluid CELLS DEGENERATED, UNABLE TO DIFFERENTIATE. %    Comment: SLIDES AVAILABLE FOR REVIEW   Other Cells, Fluid BACTERIA PRESENT, CORRELATE WITH MICROBIOLOGY. %    Comment: CALLED TO G SCALCO,RN [id number] 12/04/14 D BRADLEY  pH, body fluid     Status: None   Collection Time: 12/04/14 12:11 PM  Result Value Ref Range   pH, Fluid Type BODY FLUID     Comment: CORRECTED ON 07/31 AT 0341: PREVIOUSLY REPORTED AS Body Fluid   pH, Fluid 7.00     Comment: Performed at [facility]  Fungus  Culture with Smear     Status: None (Preliminary result)   Collection Time: 12/04/14 12:11 PM  Result Value Ref Range   Specimen Description PLEURAL    Special Requests NONE    Fungal Smear      NO YEAST OR FUNGAL ELEMENTS SEEN Performed at [facility]    Culture      CULTURE IN PROGRESS FOR FOUR WEEKS Performed at [facility]    Report Status PENDING   Culture, body fluid-bottle     Status: None (Preliminary result)   Collection Time: 12/04/14 12:11 PM  Result Value Ref Range   Specimen Description PLEURAL    Special Requests NONE    Culture NO GROWTH < 24 HOURS    Report Status PENDING   Protein, total     Status: Abnormal   Collection Time: 12/04/14  1:45 PM  Result Value Ref Range   Total Protein 5.5 (L) 6.5 - 8.1 g/dL  Lactate dehydrogenase     Status: Abnormal   Collection Time: 12/04/14  1:45 PM  Result Value Ref Range   LDH 389 (H) 98 - 192 U/L  Glucose, capillary     Status: Abnormal   Collection Time: 12/04/14  4:10 PM  Result Value Ref Range   Glucose-Capillary 119 (H) 65 - 99 mg/dL  CBC     Status: Abnormal   Collection Time: 12/04/14  4:36 PM  Result Value Ref Range   WBC 17.8 (H) 4.0 - 10.5 K/uL   RBC 3.52 (L) 4.22 - 5.81 MIL/uL   Hemoglobin 10.1 (L) 13.0 - 17.0 g/dL   HCT 31.4 (L) 39.0 - 52.0 %   MCV 89.2 78.0 - 100.0 fL   MCH 28.7 26.0 - 34.0 pg   MCHC 32.2 30.0 - 36.0 g/dL   RDW 14.5 11.5 - 15.5 %   Platelets 424 (H) 150 - 400 K/uL  Protime-INR     Status: Abnormal   Collection Time: 12/04/14  4:36 PM  Result Value Ref Range   Prothrombin Time 16.9 (H) 11.6 - 15.2 seconds   INR 1.36 0.00 - 1.49  APTT     Status: None   Collection Time: 12/04/14  4:36 PM  Result Value Ref Range   aPTT 35 24 - 37 seconds  Type and screen     Status: None (Preliminary result)   Collection Time: 12/04/14  4:36 PM  Result Value Ref Range   ABO/RH(D) O POS    Antibody Screen NEG    Sample Expiration 12/07/2014    Unit Number [id number]     Blood Component Type RED CELLS,LR    Unit division 00    Status of Unit ALLOCATED    Transfusion Status OK TO TRANSFUSE    Crossmatch Result Compatible    Unit Number [id number]    Blood Component Type RED CELLS,LR    Unit division 00    Status of Unit ALLOCATED    Transfusion Status OK TO TRANSFUSE    Crossmatch Result Compatible   ABO/Rh     Status: None   Collection Time: 12/04/14  4:36 PM  Result Value Ref Range   ABO/RH(D) O POS   Blood gas, arterial on room air     Status: Abnormal   Collection Time: 12/04/14  5:17 PM  Result Value Ref Range   FIO2 0.21    pH, Arterial 7.531 (H) 7.350 - 7.450   pCO2 arterial 32.0 (L) 35.0 - 45.0 mmHg   pO2, Arterial 52.0 (L) 80.0 - 100.0 mmHg   Bicarbonate 26.6 (H) 20.0 - 24.0 mEq/L   TCO2 27.6 0 - 100 mmol/L   Acid-Base Excess 3.8 (H) 0.0 - 2.0 mmol/L   O2 Saturation 88.1 %   Patient temperature 98.6    Collection site RIGHT RADIAL    Drawn by [id number]    Sample type ARTERIAL DRAW    Allens test (pass/fail) PASS PASS  Urinalysis, Routine w reflex microscopic (not at [facility])     Status: Abnormal   Collection Time: 12/04/14  5:17 PM  Result Value Ref Range   Color, Urine YELLOW YELLOW   APPearance CLEAR CLEAR   Specific Gravity, Urine 1.013 1.005 - 1.030   pH 5.5 5.0 - 8.0   Glucose, UA NEGATIVE NEGATIVE mg/dL   Hgb urine dipstick NEGATIVE NEGATIVE   Bilirubin Urine NEGATIVE NEGATIVE   Ketones, ur 15 (A) NEGATIVE mg/dL   Protein, ur NEGATIVE NEGATIVE mg/dL   Urobilinogen, UA 1.0 0.0 - 1.0 mg/dL   Nitrite NEGATIVE NEGATIVE   Leukocytes, UA NEGATIVE  NEGATIVE    Comment: MICROSCOPIC NOT DONE ON URINES WITH NEGATIVE PROTEIN, BLOOD, LEUKOCYTES, NITRITE, OR GLUCOSE <1000 mg/dL.  Troponin May (q 6hr x 3)     Status: Abnormal   Collection Time: 12/04/14  6:05 PM  Result Value Ref Range   Troponin May 0.04 (H) <0.031 ng/mL    Comment:        PERSISTENTLY INCREASED TROPONIN VALUES IN THE RANGE OF 0.04-0.49 ng/mL CAN BE SEEN IN:        -UNSTABLE ANGINA       -CONGESTIVE HEART FAILURE       -MYOCARDITIS       -CHEST TRAUMA       -ARRYHTHMIAS       -LATE PRESENTING MYOCARDIAL INFARCTION       -COPD   CLINICAL FOLLOW-UP RECOMMENDED.   Glucose, capillary     Status: Abnormal   Collection Time: 12/04/14  8:19 PM  Result Value Ref Range   Glucose-Capillary 153 (H) 65 - 99 mg/dL  Glucose, capillary     Status: Abnormal   Collection Time: 12/04/14 10:35 PM  Result Value Ref Range   Glucose-Capillary 111 (H) 65 - 99 mg/dL  Troponin May (q 6hr x 3)     Status: None   Collection Time: 12/04/14 11:00 PM  Result Value Ref Range   Troponin May <0.03 <0.031 ng/mL    Comment:        NO INDICATION OF MYOCARDIAL INJURY.   Glucose, capillary     Status: Abnormal   Collection Time: 12/05/14 12:26 AM  Result Value Ref Range   Glucose-Capillary 104 (H) 65 - 99 mg/dL  Surgical pcr screen     Status: None   Collection Time: 12/05/14  1:23 AM  Result Value Ref Range   MRSA, PCR NEGATIVE NEGATIVE   Staphylococcus aureus NEGATIVE NEGATIVE    Comment:        The Xpert SA Assay (FDA approved for NASAL specimens in patients over 62 years of age), is one component of a comprehensive surveillance program.  Test performance has been validated by [facility] for patients greater than or equal to 66 year old. It is not intended to diagnose infection nor to guide or monitor treatment.   Glucose, capillary     Status: Abnormal   Collection Time: 12/05/14  3:58 AM  Result Value Ref Range   Glucose-Capillary 114 (H) 65 - 99 mg/dL  Troponin May (q 6hr x 3)     Status: None   Collection Time: 12/05/14  5:17 AM  Result Value Ref Range   Troponin May <0.03 <0.031 ng/mL    Comment:        NO INDICATION OF MYOCARDIAL INJURY.   Comprehensive metabolic panel     Status: Abnormal   Collection Time: 12/05/14  5:17 AM  Result Value Ref Range   Sodium 142 135 - 145 mmol/L   Potassium 3.5 3.5 - 5.1 mmol/L   Chloride 108 101 - 111 mmol/L   CO2  27 22 - 32 mmol/L   Glucose, Bld 138 (H) 65 - 99 mg/dL   BUN 7 6 - 20 mg/dL   Creatinine, Ser 0.55 (L) 0.61 - 1.24 mg/dL   Calcium 7.4 (L) 8.9 - 10.3 mg/dL   Total Protein 4.6 (L) 6.5 - 8.1 g/dL   Albumin 1.3 (L) 3.5 - 5.0 g/dL   AST 33 15 - 41 U/L   ALT 25 17 - 63 U/L   Alkaline Phosphatase 73 38 -  126 U/L   Total Bilirubin 0.7 0.3 - 1.2 mg/dL   GFR calc non Af Amer >60 >60 mL/min   GFR calc Af Amer >60 >60 mL/min    Comment: (NOTE) The eGFR has been calculated using the CKD EPI equation. This calculation has not been validated in all clinical situations. eGFR's persistently <60 mL/min signify possible Chronic Kidney Disease.    Anion gap 7 5 - 15  CBC with Differential/Platelet     Status: Abnormal   Collection Time: 12/05/14  5:17 AM  Result Value Ref Range   WBC 14.0 (H) 4.0 - 10.5 K/uL   RBC 3.46 (L) 4.22 - 5.81 MIL/uL   Hemoglobin 9.9 (L) 13.0 - 17.0 g/dL   HCT 31.1 (L) 39.0 - 52.0 %   MCV 89.9 78.0 - 100.0 fL   MCH 28.6 26.0 - 34.0 pg   MCHC 31.8 30.0 - 36.0 g/dL   RDW 14.7 11.5 - 15.5 %   Platelets 403 (H) 150 - 400 K/uL   Neutrophils Relative % 87 (H) 43 - 77 %   Neutro Abs 12.1 (H) 1.7 - 7.7 K/uL   Lymphocytes Relative 8 (L) 12 - 46 %   Lymphs Abs 1.1 0.7 - 4.0 K/uL   Monocytes Relative 5 3 - 12 %   Monocytes Absolute 0.7 0.1 - 1.0 K/uL   Eosinophils Relative 0 0 - 5 %   Eosinophils Absolute 0.0 0.0 - 0.7 K/uL   Basophils Relative 0 0 - 1 %   Basophils Absolute 0.0 0.0 - 0.1 K/uL  Protime-INR     Status: Abnormal   Collection Time: 12/05/14  5:17 AM  Result Value Ref Range   Prothrombin Time 17.0 (H) 11.6 - 15.2 seconds   INR 1.37 0.00 - 1.49  Glucose, capillary     Status: Abnormal   Collection Time: 12/05/14  8:33 AM  Result Value Ref Range   Glucose-Capillary 159 (H) 65 - 99 mg/dL  Magnesium     Status: Abnormal   Collection Time: 12/05/14 10:24 AM  Result Value Ref Range   Magnesium 1.5 (L) 1.7 - 2.4 mg/dL  Protein, pleural or peritoneal fluid      Status: None   Collection Time: 12/05/14 10:42 AM  Result Value Ref Range   Total protein, fluid 3.3 g/dL    Comment: (NOTE) No normal range established for this test Results should be evaluated in conjunction with serum values    Fluid Type-FTP PLEURAL     Comment: RIGHT FLUID B   Anaerobic culture     Status: None (Preliminary result)   Collection Time: 12/05/14 10:42 AM  Result Value Ref Range   Specimen Description PLEURAL RIGHT FLUID    Special Requests SPEC D    Gram Stain PENDING    Culture PENDING    Report Status PENDING    Recent Results (from the past 240 hour(s))  Culture, blood (routine x 2)     Status: None (Preliminary result)   Collection Time: 12/02/14  9:17 AM  Result Value Ref Range Status   Specimen Description BLOOD LEFT ANTECUBITAL  Final   Special Requests BOTTLES DRAWN AEROBIC AND ANAEROBIC 5CC  Final   Culture NO GROWTH 3 DAYS  Final   Report Status PENDING  Incomplete  Culture, blood (routine x 2)     Status: None (Preliminary result)   Collection Time: 12/02/14 12:14 PM  Result Value Ref Range Status   Specimen Description BLOOD LEFT ANTECUBITAL  Final  Special Requests BOTTLES DRAWN AEROBIC AND ANAEROBIC 5CC  Final   Culture NO GROWTH 3 DAYS  Final   Report Status PENDING  Incomplete  Clostridium Difficile by PCR (not at [facility])     Status: None   Collection Time: 12/03/14 11:17 AM  Result Value Ref Range Status   C difficile by pcr NEGATIVE NEGATIVE Final  Culture, Urine     Status: None (Preliminary result)   Collection Time: 12/03/14  3:16 PM  Result Value Ref Range Status   Specimen Description URINE, RANDOM  Final   Special Requests NONE  Final   Culture 30,000 COLONIES/mL ENTEROCOCCUS SPECIES  Final   Report Status PENDING  Incomplete  Gram stain     Status: None   Collection Time: 12/04/14 12:11 PM  Result Value Ref Range Status   Specimen Description PLEURAL  Final   Special Requests NONE  Final   Gram Stain   Final    ABUNDANT  WBC PRESENT,BOTH PMN AND MONONUCLEAR FEW GRAM POSITIVE COCCI IN CHAINS IN CLUSTERS FEW GRAM NEGATIVE COCCI IN CHAINS IN CLUSTERS CRITICAL RESULT CALLED TO, READ BACK BY AND VERIFIED WITH: SCALCO,S RN 12/04/14 1927 [place] CONFIRMED BY V WILKINS    Report Status 12/04/2014 FINAL  Final  Fungus Culture with Smear     Status: None (Preliminary result)   Collection Time: 12/04/14 12:11 PM  Result Value Ref Range Status   Specimen Description PLEURAL  Final   Special Requests NONE  Final   Fungal Smear   Final    NO YEAST OR FUNGAL ELEMENTS SEEN Performed at [facility]    Culture   Final    CULTURE IN PROGRESS FOR FOUR WEEKS Performed at [facility]    Report Status PENDING  Incomplete  Culture, body fluid-bottle     Status: None (Preliminary result)   Collection Time: 12/04/14 12:11 PM  Result Value Ref Range Status   Specimen Description PLEURAL  Final   Special Requests NONE  Final   Culture NO GROWTH < 24 HOURS  Final   Report Status PENDING  Incomplete  Surgical pcr screen     Status: None   Collection Time: 12/05/14  1:23 AM  Result Value Ref Range Status   MRSA, PCR NEGATIVE NEGATIVE Final   Staphylococcus aureus NEGATIVE NEGATIVE Final    Comment:        The Xpert SA Assay (FDA approved for NASAL specimens in patients over 17 years of age), is one component of a comprehensive surveillance program.  Test performance has been validated by [facility] for patients greater than or equal to 89 year old. It is not intended to diagnose infection nor to guide or monitor treatment.   Anaerobic culture     Status: None (Preliminary result)   Collection Time: 12/05/14 10:42 AM  Result Value Ref Range Status   Specimen Description PLEURAL RIGHT FLUID  Final   Special Requests SPEC D  Final   Gram Stain PENDING  Incomplete   Culture PENDING  Incomplete   Report Status PENDING  Incomplete   Creatinine:  Recent Labs  12/02/14 0917 12/03/14 0618  12/04/14 0524 12/05/14 0517  CREATININE 1.17 0.95 0.63 0.55*    Xrays: See report/chart   Impression/Assessment:   Prostatic urethral scarring , as noted by multiple failed attempts at cahteter passate in OR. He will need flex. Cysto and passage of catheter.   Plan:   Flex bedside cysto and catheter passage.   Logan May  Logan May 12/05/2014, 3:39 PM

## 2014-12-05 NOTE — Progress Notes (Signed)
Name: Logan May MRN: [id number] DOB: Dec 28, 1945    ADMISSION DATE:  12/02/2014 CONSULTATION DATE:  12/03/2014  REFERRING MD :  Dr. Grandville Silos [facility]  CHIEF COMPLAINT:  DOE  BRIEF PATIENT DESCRIPTION: 69 year old male admitted 7/28 with complaints of productive cough and SOB x 3 weeks. Admitted 7/28 for R sided CAP with associated effusion on CXR. PCCM consulted for evaluation of effusion.   SIGNIFICANT EVENTS  7/29 admitted 7/31 rt vats for empyema with VDRF and hypotesion  STUDIES:  CT Chest 7/29:  Loculated R pleural effusion with gas formation.    SUBJECTIVE:  Sedated on vent  VITAL SIGNS: Temp:  [98 F (36.7 C)-99.1 F (37.3 C)] 98 F (36.7 C) (07/31 1317) Pulse Rate:  [58-141] 58 (07/31 1404) Resp:  [10-24] 19 (07/31 1415) BP: (110-147)/(58-82) 112/73 mmHg (07/31 1404) SpO2:  [90 %-100 %] 100 % (07/31 1404) Arterial Line BP: (113-155)/(45-56) 113/45 mmHg (07/31 1415) FiO2 (%):  [1 %-100 %] 1 % (07/31 1320)  PHYSICAL EXAMINATION: General:  Sedated on vent post vats Neuro:  Sedated on vent HEENT:  [place]/AT, PERRL, no JVD,OTT->. 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 Skin: rt vats dressing intact   Recent Labs Lab 12/03/14 0618 12/04/14 0524 12/05/14 0517  NA 139 141 142  K 3.2* 3.0* 3.5  CL 104 109 108  CO2 21* 27 27  BUN 20 9 7   CREATININE 0.95 0.63 0.55*  GLUCOSE 220* 166* 138*    Recent Labs Lab 12/04/14 0524 12/04/14 1636 12/05/14 0517  HGB 10.9* 10.1* 9.9*  HCT 33.8* 31.4* 31.1*  WBC 18.5* 17.8* 14.0*  PLT 445* 424* 403*   I reviewed CT Chest as below:  Dg Chest 1 View  12/04/2014   CLINICAL DATA:  Post right thoracentesis  EXAM: CHEST  1 VIEW  COMPARISON:  12/02/2014  FINDINGS: Diffuse right lung airspace disease again noted, slightly  improved. Decreasing right effusion following thoracentesis. No pneumothorax. Heart is borderline in size. Nodular density projecting over the left lung base is felt represent nipple shadow. Left lung is clear. No acute bony abnormality.  IMPRESSION: Decreasing right effusion and diffuse right lung airspace disease. No pneumothorax.   Electronically Signed   By: Rolm Baptise M.D.   On: 12/04/2014 13:10   Ct Chest Wo Contrast  12/04/2014   CLINICAL DATA:  Right hemithorax opacification, assess hemothorax, effusion and loculation. Review of electronic records demonstrates history of productive cough and shortness of breath for 3 weeks. Recent falls.  EXAM: CT CHEST WITHOUT CONTRAST  TECHNIQUE: Multidetector CT imaging of the chest was performed following the standard protocol without IV contrast.  COMPARISON:  Chest radiograph 12/02/2014  FINDINGS: Large volume of loculated right pleural fluid. Within the right lower hemithorax are multiple foci of air within the large volume pleural fluid raising concern for empyema. There is mass effect on the adjacent bronchi, with compressive atelectasis of the dependent right upper lobe and consolidation in the right middle lobe with air bronchograms. The origin of the right lower lobe bronchus is not well-defined, and no aerated right lower lobe lung is confidently identified. Pleural fluid is heterogeneous in density.  There is a small left pleural effusion that appears simple fluid in density. Adjacent compressive atelectasis in the lower lobe. The left upper lobe is clear.  Heart at the upper limits of normal in size. There are dense coronary artery calcifications. No definite pericardial fluid. Prominent small superior mediastinal lymph nodes measuring 9 mm short axis dimension. Limited assessment for hilar adenopathy. Mild atherosclerosis of normal caliber thoracic aorta.  No definite acute abnormality in the included upper abdomen, perinephric stranding is partially  included.  There are no acute rib fractures. Remote nondisplaced lower right rib fractures with well-formed callus. Age related degenerative change throughout spine. No blastic or destructive lytic osseous lesions.  IMPRESSION: 1. Large partially loculated heterogeneous right pleural effusion. In the lower portion are multiple foci of air. Findings are most concerning for empyema. There is consolidation or atelectasis of the entire right lower lobe, consolidation with air bronchograms in the right middle lobe, and compressive atelectasis of the adjacent right upper lobe. Hemothorax is felt less likely, given the right lower rib fractures appear remote. 2. Small left pleural effusion with adjacent compressive atelectasis. 3. Dense coronary artery calcifications.   Electronically Signed   By: Jeb Levering M.D.   On: 12/04/2014 02:42   Dg Chest Port 1 View  12/05/2014   CLINICAL DATA:  Pneumothorax.  EXAM: PORTABLE CHEST - 1 VIEW  COMPARISON:  Radiograph 12/04/2014, CT 12/04/2014, chest radiograph 12/02/2014  FINDINGS: 3 RIGHT chest tubes noted. There is interval decrease in volume of the pleural fluid in the RIGHT hemi thorax. No appreciable pneumothorax identified. RIGHT central venous line is in place with tip in the distal SVC. Endotracheal tube is 3.8 cm from carina. LEFT lung is clear.  IMPRESSION: 1. Decrease in pleural fluid in the RIGHT hemi thorax. 2. Three right chest tubes in place without pneumothorax. 3. Endotracheal tube and central venous line appear in good position.   Electronically Signed   By: Suzy Bouchard M.D.   On: 12/05/2014 14:09   Dg Swallowing Func-speech Pathology  12/04/2014    Objective Swallowing Evaluation:    Patient Details  Name: Logan May MRN: [id number] Date of Birth: Apr 28, 1946  Today's Date: 12/04/2014 Time: SLP Start Time (ACUTE ONLY): 1235-SLP Stop Time (ACUTE ONLY): 1258 SLP Time Calculation (min) (ACUTE ONLY): 23 min  Past Medical History:  Past Medical  History  Diagnosis Date  . BPH (benign prostatic hypertrophy)   . Prostate cancer     implants   . Vitamin D deficiency     takes Vit D daily  . History of ETT 1999    Dr, Caleen Essex  . Anxiety     takes Valium daily as needed  . Hyperlipidemia     takes Simvastatin nightly  . Hypertension     takes Quinapril and Procardia daily  . Headache(784.0)     occasionally  . Peripheral neuropathy   . Diabetes mellitus, type 2     but doesn't take any meds for it;diet controlled and exercise  . 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 Pneumonia 1960's?  . CAP (community acquired pneumonia) 12/02/2014   Past Surgical History:  Past Surgical History  Procedure Laterality Date  . Foot surgery  May 2002    Dr. Sharol Given   . Left foot casted 4-5 months    . Bilateral  eye laser surgery Bilateral 2000    "related to diabetes"  . Charcot foot left Left 12/15/01  . Laser eye surgery right  12/2002  . Eye surgery    . Tonsillectomy    . I&d extremity Left 09/05/2012    Procedure: IRRIGATION AND DEBRIDEMENT EXTREMITY;  Surgeon:  Newt Minion, MD;  Location: [place];  Service: Orthopedics;  Laterality: Left;   Excision Charcot Collapse Left Foot and Base 5th Metatarsal, Antibiotic  Beads  . Esophagogastroduodenoscopy      at least 21yrs ago  . Amputation Left 05/20/2013    Procedure: AMPUTATION DIGIT;  Surgeon: Newt Minion, MD;  Location: [place];  Service: Orthopedics;  Laterality: Left;  Left Great Toe Amputation  at [facility]   HPI:  Other Pertinent Information: Pt is a 69 y.o. male with PMH of Diabetes  type 2 with Charcot foot supposedly diet-controlled, hypertension,  generalized anxiety disorder, history of atrial fibrillation not on  anticoagulation presumably secondary to falls. Patient presents to the  hospital with 3 weeks of worsening productive cough with purulent sputum.  Guaifenesin has been helpful, but has not resolved his cough. He also has  been having increased weakness with some mild dyspnea on exertion which is  improved with rest. Also  noted concerns of very dry mouth and poor oral  hygiene. CXR 7/28 revealed diffuse opacification of RLL consistent with  PNA with associated pleural effusion. Bedside swallow eval ordered to aide  in ruling out aspiration.   No Data Recorded  Assessment / Plan / Recommendation CHL IP CLINICAL IMPRESSIONS 12/04/2014  Therapy Diagnosis Mild oral phase dysphagia;Moderate pharyngeal phase  dysphagia;Mild cervical esophageal phase dysphagia  Clinical Impression Pt currently demonstrating a mild oral/ moderate  pharyngeal dysphagia that appears sensorimotor in nature. Delay in swallow  initiation accompanied by reduced airway protection (reduced base of  tongue retraction and minimal epiglottic inversion) resulted in  penetration during the swallow x1 of thin liquids. Reduced epiglottic  inversion/ base of tongue retraction also resulted in moderate-severe  amounts of vallecular residuals across consistencies which led to  penetration post-swallow x1 of thin liquids- pt had a weak throat clear  response and cleared remaining penetrates with cued cough. Pt is not able  to control sip size and large sip of nectar thick liquids resulted in  significant residuals in vallecula, about half of which were cleared with  multiple swallows and cough/ throat clears (cued- pt did not sense).  Nectar thick liquids by teaspoon accompanied by chin tuck reduced  residuals and controlled sip size. Minimal backflow to cervical esophagus  observed across consistencies. Recommend initiating dysphagia 2 diet,  nectar thick liquids by teaspoon, meds crushed in puree, full supervision  to cue pt to tuck chin, swallow 2x with chin down. Also have pt sit  upright 30 minutes after meal. Pt was able to follow instructions for  strategies during MBS with a visual cue- hopeful that this will continue  at bedside as well but decreased cognitive status continues to put pt at  increased risk of aspiration. SLP will continue to follow closely for diet   tolerance/ consider advancement or repeat MBS as cognitive status  improves.      CHL IP TREATMENT RECOMMENDATION 12/04/2014  Treatment Recommendations Therapy as outlined in treatment plan below     CHL IP DIET RECOMMENDATION 12/04/2014  SLP Diet Recommendations Dysphagia 2 (Fine chop);Nectar  Liquid Administration via (None)  Medication Administration Crushed with puree  Compensations Slow rate;Small sips/bites;Multiple dry swallows after each  bite/sip;Clear throat intermittently;Chin tuck  Postural Changes and/or Swallow Maneuvers (None)     CHL IP OTHER RECOMMENDATIONS 12/04/2014  Recommended Consults (None)  Oral Care Recommendations Oral care BID  Other Recommendations Order thickener from pharmacy;Clarify dietary  restrictions     No  flowsheet data found.   CHL IP FREQUENCY AND DURATION 12/04/2014  Speech Therapy Frequency (ACUTE ONLY) min 2x/week  Treatment Duration 2 weeks     Pertinent Vitals/Pain none    SLP Swallow Goals No flowsheet data found.  No flowsheet data found.    CHL IP REASON FOR REFERRAL 12/04/2014  Reason for Referral Objectively evaluate swallowing function     CHL IP ORAL PHASE 12/04/2014  Lips (None)  Tongue (None)  Mucous membranes (None)  Nutritional status (None)  Other (None)  Oxygen therapy (None)  Oral Phase Impaired  Oral - Pudding Teaspoon (None)  Oral - Pudding Cup (None)  Oral - Honey Teaspoon (None)  Oral - Honey Cup (None)  Oral - Honey Syringe (None)  Oral - Nectar Teaspoon (None)  Oral - Nectar Cup (None)  Oral - Nectar Straw (None)  Oral - Nectar Syringe (None)  Oral - Ice Chips (None)  Oral - Thin Teaspoon (None)  Oral - Thin Cup (None)  Oral - Thin Straw (None)  Oral - Thin Syringe (None)  Oral - Puree (None)  Oral - Mechanical Soft (None)  Oral - Regular (None)  Oral - Multi-consistency (None)  Oral - Pill (None)  Oral Phase - Comment (None)      CHL IP PHARYNGEAL PHASE 12/04/2014  Pharyngeal Phase Impaired  Pharyngeal - Pudding Teaspoon (None)  Penetration/Aspiration details  (pudding teaspoon) (None)  Pharyngeal - Pudding Cup (None)  Penetration/Aspiration details (pudding cup) (None)  Pharyngeal - Honey Teaspoon (None)  Penetration/Aspiration details (honey teaspoon) (None)  Pharyngeal - Honey Cup (None)  Penetration/Aspiration details (honey cup) (None)  Pharyngeal - Honey Syringe (None)  Penetration/Aspiration details (honey syringe) (None)  Pharyngeal - Nectar Teaspoon (None)  Penetration/Aspiration details (nectar teaspoon) (None)  Pharyngeal - Nectar Cup (None)  Penetration/Aspiration details (nectar cup) (None)  Pharyngeal - Nectar Straw (None)  Penetration/Aspiration details (nectar straw) (None)  Pharyngeal - Nectar Syringe (None)  Penetration/Aspiration details (nectar syringe) (None)  Pharyngeal - Ice Chips (None)  Penetration/Aspiration details (ice chips) (None)  Pharyngeal - Thin Teaspoon (None)  Penetration/Aspiration details (thin teaspoon) (None)  Pharyngeal - Thin Cup (None)  Penetration/Aspiration details (thin cup) (None)  Pharyngeal - Thin Straw (None)  Penetration/Aspiration details (thin straw) (None)  Pharyngeal - Thin Syringe (None)  Penetration/Aspiration details (thin syringe') (None)  Pharyngeal - Puree (None)  Penetration/Aspiration details (puree) (None)  Pharyngeal - Mechanical Soft (None)  Penetration/Aspiration details (mechanical soft) (None)  Pharyngeal - Regular (None)  Penetration/Aspiration details (regular) (None)  Pharyngeal - Multi-consistency (None)  Penetration/Aspiration details (multi-consistency) (None)  Pharyngeal - Pill (None)  Penetration/Aspiration details (pill) (None)  Pharyngeal Comment (None)      CHL IP CERVICAL ESOPHAGEAL PHASE 12/04/2014  Cervical Esophageal Phase Impaired  Pudding Teaspoon (None)  Pudding Cup (None)  Honey Teaspoon (None)  Honey Cup (None)  Honey Straw (None)  Nectar Teaspoon Reduced cricopharyngeal relaxation;Esophageal backflow  into cervical esophagus  Nectar Cup Reduced cricopharyngeal relaxation;Esophageal  backflow into  cervical esophagus  Nectar Straw (None)  Nectar Sippy Cup (None)  Thin Teaspoon (None)  Thin Cup Reduced cricopharyngeal relaxation;Esophageal backflow into  cervical esophagus  Thin Straw (None)  Thin Sippy Cup (None)  Cervical Esophageal Comment (None)    No flowsheet data found.         Kern Reap, MA, CCC-SLP 12/04/2014, 1:55 PM [phone]    ASSESSMENT / PLAN:   ASSESSMENT / PLAN:  PULMONARY OETT 7/31 min OR for Vats for rt empyema>> A: Rt empyema post VATS/Thora  COPD/Empysema P:   Vent bundle, goal overnight vent support and am extubation BD  CARDIOVASCULAR CVL 7/31 rt i j cvl>> A:  Hypotension CAF P:  Fluids challenge Neo drip(afib) Check 2 d  CVP q shift   RENAL A:  No acute issue P:     GASTROINTESTINAL A:   GI protection P:   PPI Place OGT>>  HEMATOLOGIC A:   No acute issue P:  Monitor for blood loss anemia  INFECTIOUS A:   Rt empyema  P:   BCx2 7/31>> UC 7/31 >> Sputum7/31>> Abx:  7/30 vanc>> 7/30 Pip-tazo>>  ENDOCRINE A:   DM  P:   SSI  NEUROLOGIC A:  Intubated and sedated overnight  P:   RASS goal: -1 Sedated on vent RASS-1   FAMILY  - Updates: None at bedside  - Inter-disciplinary family meet or Palliative Care meeting due by:  day 7    TODAY'S SUMMARY:  68  Yo post rt vats 7/31 with 3 litre of pus removed.  Richardson Landry Minor ACNP Maryanna Shape PCCM Pager [phone] till 3 pm If no answer page [phone] 12/05/2014, 2:43 PM     Attending:  I have seen and examined the patient with nurse practitioner/resident and agree with the note above.   Mr. Rhett had his VATs today with purulent material removed from R chest cavity Apparently he has problems with aspiration Refused neurology consultation  On exam he is sedated on vent Lungs with rhonchi on R, CTA left CV Irreg irreg Ext: partial amputation left toes  Shock> suspect medication related; was on neo intraoperatively then rapidly changed dilt; bolus  saline, give neo afib with rvr> hold dilt, may need amiodarone Empyema> f/u cultures, chest tube per TCTS REspiratory failure/COPD> scheduled duoneb, full vent support, extubate in AM  My cc time 40 minutes  Roselie Awkward, MD [facility] Pager: [phone] Cell: [phone] After 3pm or if no response, call [phone]

## 2014-12-05 NOTE — Progress Notes (Signed)
      [address]       [place] [address]             [phone]      Intubated and sedated  BP 108/64 mmHg  Pulse 60  Temp(Src) 98 F (36.7 C) (Oral)  Resp 17  Ht 5\' 11"  (1.803 m)  Wt 182 lb (82.555 kg)  BMI 25.40 kg/m2  SpO2 100%  BP has been labile suspect he may be dry, could a component of bacteremia as well   Intake/Output Summary (Last 24 hours) at 12/05/14 1743 Last data filed at 12/05/14 1326  Gross per 24 hour  Intake 3437.5 ml  Output   1875 ml  Net 1562.5 ml    ~ 400 ml form CT so far today  Continue Vanco and zosyn  Adalaya Irion C. Roxan Hockey, MD [facility] [phone]

## 2014-12-05 NOTE — Op Note (Signed)
NAMEMARGIE, BRINK NO.:  [id number]  MEDICAL RECORD NO.:  [id number]  LOCATION:  2S14C                        FACILITY:  [facility]  PHYSICIAN:  Revonda Standard. Roxan Hockey, M.D.DATE OF BIRTH:  19-Mar-1946  DATE OF PROCEDURE:  12/05/2014 DATE OF DISCHARGE:                              OPERATIVE REPORT   PREOPERATIVE DIAGNOSES:  Right empyema.  POSTOPERATIVE DIAGNOSES:  Right empyema.  PROCEDURE:   Bronchoscopy Right video-assisted thoracoscopy Drainage of right empyema Visceral and parietal pleural decortication.  SURGEON:  Revonda Standard. Roxan Hockey, M.D.  ASSISTANT:  Lars Pinks, PA  ANESTHESIA:  General.  FINDINGS:  Bronchoscopy revealed normal endobronchial anatomy.  There was extrinsic compression of the middle and lower lobe airways on the right.  No endobronchial lesions were seen.  Thoracoscopy- a large, multiloculated, organizing empyema with areas of frankly purulent fluid.  Good re-expansion, post decortication.  CLINICAL NOTE:  Mr. Logan May is a 69 year old man, who presented with a 3-week history of a productive cough and general malaise.  He was admitted with a diagnosis of pneumonia.  CT showed a large right pleural effusion, consistent with an empyema.  He had a thoracentesis, which revealed purulent fluid.  Chest x-ray post thoracentesis showed a large residual effusion.  The patient was advised to undergo bronchoscopy and thoracoscopic drainage of the empyema and decortication.  The indications, risks, benefits, and alternatives were discussed in detail with the patient.  He understood and accepted the risks and agreed to proceed.  OPERATIVE NOTE:  Mr. Logan May was brought to the preoperative holding area on December 05, 2014.  Anesthesia placed an arterial blood pressure monitoring line and a central venous line.  He was taken to the operating room, anesthetized, and intubated. Sequential compressive devices were placed for DVT prophylaxis.  Intravenous vancomycin was given. An attempt was made to place a Foley catheter, but was unsuccessful. Flexible fiberoptic bronchoscopy was performed via the endotracheal tube.  It revealed normal endobronchial anatomy.  There was extrinsic compression of the lower and middle lobe airways.  No endobronchial lesions were seen.    The patient was re-intubated with a double-lumen endotracheal tube. The patient was placed in a left lateral decubitus position and the right chest was prepped and draped in usual sterile fashion.  Single lung ventilation of the left lung was initiated, this was tolerated well initially, but the right lung needed to be re-inflated intermittently throughout the procedure due to desaturation.  An incision was made in the seventh intercostal space in the midaxillary line.  The chest was entered bluntly using a hemostat.  Purulent fluid was evacuated.  A 5 mm port was placed and the thoracoscope was advanced into the chest. There was a multiloculated empyema with murky fluid in most areas, but frankly purulent fluid in others.  A small utility incision, approximately 4 cm in length, was made in the fifth interspace.  The adhesions were taken down and additional fluid was evacuated.  The loculations were broken up throughout the chest.  There was an extensive visceral and pleural parietal peel.  The tissue was very friable.  The lung was relatively densely adherent to the diaphragm.  Fluid was sent for cultures as was the  pleural peel.  In addition, parietal and pleural peel were also sent for Pathology.  The lower lobe was densely encased as well as a portion of the middle lobe.  Decortication was performed.  The peel was removed easily for the most part, about 80% of the peel was loosely attached, however, the other 20% and was in areas that it was much more difficult to get the peel to come off the lung and some small pleural tears were noted.  The right lung was  intermittently ventilated due to decreasing oxygen saturations.  This helped to assess whether the decortication was adequate to achieve reasonable re-expansion. After the visceral pleural decortication was completed, the parietal pleura was decorticated and as much debris was removed as possible. This was a very friable tissue and there was extensive bleeding with the parietal decortication.  The chest was copiously irrigated with approximately a 2.5 L of warm saline and care was taken to remove all of the necrotic debris that was possible.  Two additional port type incisions were made.  A 32- [place] Blake drains were placed posteriorly along the paravertebral gutter and along the diaphragm.  A 28-French chest tube was placed anteriorly.  The tubes were connected to a Pleur-evac suction.  The patient was placed back in a supine position.  He was re-intubated with a single-lumen endotracheal tube and taken to the Postanesthetic Care Unit, intubated.  He was in rapid atrial fibrillation and diltiazem infusion was initiated on arrival to PACU.  All sponge, needle, and instrument counts were correct.  There were no intraoperative complications.     Revonda Standard Roxan Hockey, M.D.     SCH/MEDQ  D:  12/05/2014  T:  12/05/2014  Job:  [id number]

## 2014-12-05 NOTE — Progress Notes (Signed)
RT note-ETT confirmed at 25cm per Dr. Lake Bells with xray taken in [facility].

## 2014-12-05 NOTE — Progress Notes (Signed)
Utilization Review completed. Logan Tabbert RN BSN CM 

## 2014-12-05 NOTE — Anesthesia Procedure Notes (Addendum)
Procedure Name: Intubation Date/Time: 12/05/2014 9:47 AM Performed by: Lavell Luster Pre-anesthesia Checklist: Patient identified, Emergency Drugs available, Suction available, Patient being monitored and Timeout performed Patient Re-evaluated:Patient Re-evaluated prior to inductionOxygen Delivery Method: Circle system utilized Preoxygenation: Pre-oxygenation with 100% oxygen Intubation Type: IV induction Ventilation: Mask ventilation without difficulty Laryngoscope Size: Mac and 3 Grade View: Grade I Tube type: Oral Tube size: 9.0 mm Number of attempts: 1 Airway Equipment and Method: Stylet Placement Confirmation: ETT inserted through vocal cords under direct vision,  positive ETCO2 and breath sounds checked- equal and bilateral Tube secured with: Tape Dental Injury: Teeth and Oropharynx as per pre-operative assessment  Comments: Easy atraumatic induction and intubation with MAC 3 blade.  9.5 ETT placed for bronchoscopy, Reintubated with DLT for VATS.      Procedure Name: Intubation Date/Time: 12/05/2014 10:00 AM Performed by: Lavell Luster Pre-anesthesia Checklist: Patient identified, Emergency Drugs available, Suction available, Patient being monitored and Timeout performed Patient Re-evaluated:Patient Re-evaluated prior to inductionOxygen Delivery Method: Circle system utilized Preoxygenation: Pre-oxygenation with 100% oxygen Intubation Type: IV induction Ventilation: Mask ventilation without difficulty Laryngoscope Size: Mac and 3 Grade View: Grade I Tube type: Oral Endobronchial tube: Left, EBT position confirmed by auscultation, Double lumen EBT and EBT position confirmed by fiberoptic bronchoscope and 41 Fr Number of attempts: 1 Airway Equipment and Method: Stylet Placement Confirmation: ETT inserted through vocal cords under direct vision,  positive ETCO2 and breath sounds checked- equal and bilateral Tube secured with: Tape Dental Injury: Teeth and Oropharynx as per  pre-operative assessment

## 2014-12-05 NOTE — Op Note (Signed)
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 Gaynelle Arabian, MD  First assistant: Malachy Mood, RN  Anesthesia:  IV sedation: Fentanyl, 50mg   Preparation: After Fentanyl IV sedation, the penis was prepped with Betadine solution and drapped in the usual fashion.  Review history: AUR, with inability to pass foley cath intra op for fluid monitoring.  Statement of  Likelihood of Success: Excellent. TIME-OUT observed.:  Procedure: Flexible cystoscopy was accomplished in the supine position. The pendulous urethra was normal. The proximal urethra was very tight, but the very was identified, and the strictured membraneous urethra was negotiated, with the scope passed into the urinary bladder. The 0.038 guidewire was passed through the scope into the bladder and the scope removed. The 56 F council catheter was then passed-with difficulty because of tight urethra- into the bladder-and 10cc of sterile water was placed in the balloon. 500cc of sterile urine was immediately drained from the bladder, and a total of 700cc was drained. The catheter was placed to straight drainage.

## 2014-12-05 NOTE — Clinical Social Work Placement (Signed)
   CLINICAL SOCIAL WORK PLACEMENT  NOTE  Date:  12/05/2014  Patient Details  Name: Logan May MRN: [id number] Date of Birth: 22-Dec-1945  Clinical Social Work is seeking post-discharge placement for this patient at the [place] level of care (*CSW will initial, date and re-position this form in  chart as items are completed):  Yes   Patient/family provided with [facility] Work Department's list of facilities offering this level of care within the geographic area requested by the patient (or if unable, by the patient's family).  Yes   Patient/family informed of their freedom to choose among providers that offer the needed level of care, that participate in Medicare, Medicaid or managed care program needed by the patient, have an available bed and are willing to accept the patient.  Yes   Patient/family informed of [facility]'s ownership interest in [facility] and [facility], as well as of the fact that they are under no obligation to receive care at these facilities.  PASRR submitted to EDS on 12/05/14     PASRR number received on       Existing PASRR number confirmed on       FL2 transmitted to all facilities in geographic area requested by pt/family on 12/05/14     FL2 transmitted to all facilities within larger geographic area on       Patient informed that his/her managed care company has contracts with or will negotiate with certain facilities, including the following:            Patient/family informed of bed offers received.  Patient chooses bed at       Physician recommends and patient chooses bed at      Patient to be transferred to   on  .  Patient to be transferred to facility by       Patient family notified on   of transfer.  Name of family member notified:        PHYSICIAN Please sign FL2     Additional Comment:    _______________________________________________ Cranford Mon, LCSW 12/05/2014, 10:07  AM

## 2014-12-05 NOTE — Anesthesia Preprocedure Evaluation (Addendum)
Anesthesia Evaluation  Patient identified by MRN, date of birth, ID band Patient awake    Reviewed: Allergy & Precautions, NPO status , Patient's Chart, lab work & pertinent test results  Airway Mallampati: I  TM Distance: >3 FB Neck ROM: Full    Dental  (+) Dental Advisory Given, Poor Dentition   Pulmonary pneumonia -, unresolved,  breath sounds clear to auscultation        Cardiovascular hypertension, Pt. on medications and Pt. on home beta blockers Rhythm:Regular Rate:Normal     Neuro/Psych    GI/Hepatic   Endo/Other  diabetes, Well Controlled, Type 2, Insulin Dependent  Renal/GU      Musculoskeletal   Abdominal   Peds  Hematology   Anesthesia Other Findings   Reproductive/Obstetrics                            Anesthesia Physical Anesthesia Plan  ASA: IV and emergent  Anesthesia Plan: General   Post-op Pain Management:    Induction: Intravenous  Airway Management Planned: Oral ETT and Double Lumen EBT  Additional Equipment:   Intra-op Plan:   Post-operative Plan: Extubation in OR  Informed Consent: I have reviewed the patients History and Physical, chart, labs and discussed the procedure including the risks, benefits and alternatives for the proposed anesthesia with the patient or authorized representative who has indicated his/her understanding and acceptance.   Dental advisory given  Plan Discussed with: CRNA, Anesthesiologist and Surgeon  Anesthesia Plan Comments:         Anesthesia Quick Evaluation

## 2014-12-05 NOTE — Progress Notes (Signed)
Pt going to OR. RN called report.
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
## 2014-12-06 ENCOUNTER — Encounter (HOSPITAL_COMMUNITY): Payer: Self-pay | Admitting: Thoracic Surgery (Cardiothoracic Vascular Surgery)

## 2014-12-06 ENCOUNTER — Inpatient Hospital Stay (HOSPITAL_COMMUNITY): Payer: Medicare Other

## 2014-12-06 LAB — POCT I-STAT 7, (LYTES, BLD GAS, ICA,H+H)
Acid-base deficit: 3 mmol/L — ABNORMAL HIGH (ref 0.0–2.0)
Bicarbonate: 22.4 mEq/L (ref 20.0–24.0)
Calcium, Ion: 1.03 mmol/L — ABNORMAL LOW (ref 1.13–1.30)
HCT: 29 % — ABNORMAL LOW (ref 39.0–52.0)
Hemoglobin: 9.9 g/dL — ABNORMAL LOW (ref 13.0–17.0)
O2 Saturation: 94 %
Potassium: 3.8 mmol/L (ref 3.5–5.1)
Sodium: 144 mmol/L (ref 135–145)
TCO2: 24 mmol/L (ref 0–100)
pCO2 arterial: 42.9 mmHg (ref 35.0–45.0)
pH, Arterial: 7.326 — ABNORMAL LOW (ref 7.350–7.450)
pO2, Arterial: 79 mmHg — ABNORMAL LOW (ref 80.0–100.0)

## 2014-12-06 LAB — BLOOD GAS, ARTERIAL
ACID-BASE DEFICIT: 0.3 mmol/L (ref 0.0–2.0)
Bicarbonate: 23.4 mEq/L (ref 20.0–24.0)
Drawn by: 29017
FIO2: 0.6
LHR: 16 {breaths}/min
MECHVT: 550 mL
O2 Saturation: 94.5 %
PEEP: 5 cmH2O
PH ART: 7.434 (ref 7.350–7.450)
PO2 ART: 72 mmHg — AB (ref 80.0–100.0)
Patient temperature: 98.6
TCO2: 24.5 mmol/L (ref 0–100)
pCO2 arterial: 35.5 mmHg (ref 35.0–45.0)

## 2014-12-06 LAB — GLUCOSE, CAPILLARY
GLUCOSE-CAPILLARY: 76 mg/dL (ref 65–99)
GLUCOSE-CAPILLARY: 108 mg/dL — AB (ref 65–99)
Glucose-Capillary: 125 mg/dL — ABNORMAL HIGH (ref 65–99)
Glucose-Capillary: 126 mg/dL — ABNORMAL HIGH (ref 65–99)
Glucose-Capillary: 128 mg/dL — ABNORMAL HIGH (ref 65–99)
Glucose-Capillary: 184 mg/dL — ABNORMAL HIGH (ref 65–99)

## 2014-12-06 LAB — CBC
HCT: 31.2 % — ABNORMAL LOW (ref 39.0–52.0)
Hemoglobin: 9.9 g/dL — ABNORMAL LOW (ref 13.0–17.0)
MCH: 29.2 pg (ref 26.0–34.0)
MCHC: 31.7 g/dL (ref 30.0–36.0)
MCV: 92 fL (ref 78.0–100.0)
Platelets: 488 10*3/uL — ABNORMAL HIGH (ref 150–400)
RBC: 3.39 MIL/uL — ABNORMAL LOW (ref 4.22–5.81)
RDW: 14.8 % (ref 11.5–15.5)
WBC: 25.8 10*3/uL — AB (ref 4.0–10.5)

## 2014-12-06 LAB — BASIC METABOLIC PANEL
Anion gap: 5 (ref 5–15)
BUN: 11 mg/dL (ref 6–20)
CO2: 25 mmol/L (ref 22–32)
CREATININE: 1.02 mg/dL (ref 0.61–1.24)
Calcium: 7.5 mg/dL — ABNORMAL LOW (ref 8.9–10.3)
Chloride: 109 mmol/L (ref 101–111)
GFR calc Af Amer: >60 mL/min (ref >60)
GFR calc non Af Amer: >60 mL/min (ref >60)
Glucose, Bld: 139 mg/dL — ABNORMAL HIGH (ref 65–99)
POTASSIUM: 3.9 mmol/L (ref 3.5–5.1)
Sodium: 139 mmol/L (ref 135–145)

## 2014-12-06 LAB — URINE CULTURE

## 2014-12-06 LAB — MAGNESIUM: MAGNESIUM: 2.1 mg/dL (ref 1.7–2.4)

## 2014-12-06 MED ORDER — ENOXAPARIN SODIUM 40 MG/0.4ML ~~LOC~~ SOLN
40.0000 mg | SUBCUTANEOUS | Status: DC
  Administered 2014-12-06 – 2014-12-15 (×10): 40 mg via SUBCUTANEOUS
  Filled 2014-12-06 (×10): qty 0.4

## 2014-12-06 MED ORDER — POTASSIUM CHLORIDE IN NACL 20-0.9 MEQ/L-% IV SOLN
INTRAVENOUS | Status: DC
  Administered 2014-12-06 – 2014-12-07 (×3): via INTRAVENOUS
  Administered 2014-12-08: 100 mL via INTRAVENOUS
  Administered 2014-12-08: 1000 mL via INTRAVENOUS
  Administered 2014-12-09: 04:00:00 via INTRAVENOUS
  Administered 2014-12-09: 100 mL via INTRAVENOUS
  Administered 2014-12-10 – 2014-12-11 (×2): via INTRAVENOUS
  Filled 2014-12-06 (×17): qty 1000

## 2014-12-06 MED ORDER — PRO-STAT SUGAR FREE PO LIQD
30.0000 mL | Freq: Every day | ORAL | Status: DC
  Administered 2014-12-06: 30 mL
  Filled 2014-12-06 (×4): qty 30

## 2014-12-06 MED ORDER — INSULIN ASPART 100 UNIT/ML ~~LOC~~ SOLN
0.0000 [IU] | SUBCUTANEOUS | Status: DC
  Administered 2014-12-06 (×2): 2 [IU] via SUBCUTANEOUS
  Administered 2014-12-07: 5 [IU] via SUBCUTANEOUS
  Administered 2014-12-07: 3 [IU] via SUBCUTANEOUS
  Administered 2014-12-07: 2 [IU] via SUBCUTANEOUS
  Administered 2014-12-07: 3 [IU] via SUBCUTANEOUS
  Administered 2014-12-08 – 2014-12-09 (×4): 2 [IU] via SUBCUTANEOUS
  Administered 2014-12-10 (×2): 3 [IU] via SUBCUTANEOUS
  Administered 2014-12-10 (×3): 2 [IU] via SUBCUTANEOUS
  Administered 2014-12-11 – 2014-12-12 (×6): 3 [IU] via SUBCUTANEOUS
  Administered 2014-12-12: 2 [IU] via SUBCUTANEOUS
  Administered 2014-12-12 – 2014-12-13 (×3): 3 [IU] via SUBCUTANEOUS
  Administered 2014-12-13: 5 [IU] via SUBCUTANEOUS
  Administered 2014-12-13 (×3): 3 [IU] via SUBCUTANEOUS
  Administered 2014-12-14: 2 [IU] via SUBCUTANEOUS
  Administered 2014-12-14 (×3): 3 [IU] via SUBCUTANEOUS
  Administered 2014-12-15 – 2014-12-16 (×5): 2 [IU] via SUBCUTANEOUS
  Administered 2014-12-16 – 2014-12-17 (×2): 3 [IU] via SUBCUTANEOUS
  Administered 2014-12-17 (×2): 2 [IU] via SUBCUTANEOUS
  Administered 2014-12-17: 3 [IU] via SUBCUTANEOUS
  Administered 2014-12-17 (×2): 2 [IU] via SUBCUTANEOUS
  Administered 2014-12-18: 3 [IU] via SUBCUTANEOUS
  Administered 2014-12-18: 2 [IU] via SUBCUTANEOUS
  Administered 2014-12-18: 3 [IU] via SUBCUTANEOUS

## 2014-12-06 MED ORDER — VITAL HIGH PROTEIN PO LIQD: 1000.0000 mL | ORAL | Status: DC

## 2014-12-06 MED ORDER — PANTOPRAZOLE SODIUM 40 MG IV SOLR
40.0000 mg | INTRAVENOUS | Status: DC
  Administered 2014-12-06 – 2014-12-07 (×2): 40 mg via INTRAVENOUS
  Filled 2014-12-06: qty 40

## 2014-12-06 MED ORDER — VITAL AF 1.2 CAL PO LIQD
1000.0000 mL | ORAL | Status: DC
  Administered 2014-12-06: 1000 mL
  Filled 2014-12-06 (×6): qty 1000

## 2014-12-06 MED ORDER — INSULIN ASPART 100 UNIT/ML ~~LOC~~ SOLN: 0.0000 [IU] | SUBCUTANEOUS | Status: DC

## 2014-12-06 MED ORDER — INSULIN ASPART 100 UNIT/ML ~~LOC~~ SOLN: 0.0000 [IU] | Freq: Four times a day (QID) | SUBCUTANEOUS | Status: DC

## 2014-12-06 NOTE — Progress Notes (Signed)
SLP Cancellation Note  Patient Details Name: KHARSON RASMUSSON MRN: [id number] DOB: 12/22/1945   Cancelled treatment:       Reason Eval/Treat Not Completed: Medical issues which prohibited therapy. Pt intubated. Will need re-eval by SLP after extubation; pt with moderate dysphagia prior to intubation requiring thickened liquids and compensatory strategies.    Lenton Gendreau, Katherene Ponto 12/06/2014, 9:47 AM

## 2014-12-06 NOTE — Progress Notes (Signed)
RT note-Placed back to full support at 1015 for elevated HR and restlessness.

## 2014-12-06 NOTE — Progress Notes (Signed)
1 Day Post-Op Procedure(s) (LRB): VIDEO ASSISTED THORACOSCOPY (VATS)/EMPYEMA (N/A) VIDEO BRONCHOSCOPY (N/A) Subjective: Intubated, awake, nods to questions  Objective: Vital signs in last 24 hours: Temp:  [98 F (36.7 C)-100.1 F (37.8 C)] 98 F (36.7 C) (08/01 0723) Pulse Rate:  [36-141] 66 (08/01 0700) Cardiac Rhythm:  [-] Atrial fibrillation (08/01 0700) Resp:  [0-25] 0 (08/01 0700) BP: (83-147)/(44-107) 99/85 mmHg (08/01 0700) SpO2:  [95 %-100 %] 100 % (08/01 0727) Arterial Line BP: (68-156)/(44-59) 68/57 mmHg (07/31 1700) FiO2 (%):  [1 %-100 %] 40 % (08/01 0727) Weight:  [185 lb 3 oz (84 kg)] 185 lb 3 oz (84 kg) (08/01 0700)  Hemodynamic parameters for last 24 hours: CVP:  [8 mmHg-10 mmHg] 8 mmHg  Intake/Output from previous day: 07/31 0701 - 08/01 0700 In: 4531.4 [I.V.:3681.4; IV Piggyback:850] Out: 2400 [Urine:1220; Blood:500; Chest Tube:680] Intake/Output this shift:    Neurologic: no focasl deficit Heart: irregularly irregular rhythm Lungs: rhonchi bilaterally and wheezes bilaterally Abdomen: normal findings: soft, non-tender small air leak, sanguinous drainage  Lab Results:  Recent Labs  12/05/14 0517 12/06/14 0458  WBC 14.0* 25.8*  HGB 9.9* 9.9*  HCT 31.1* 31.2*  PLT 403* 488*   BMET:  Recent Labs  12/05/14 0517 12/06/14 0458  NA 142 139  K 3.5 3.9  CL 108 109  CO2 27 25  GLUCOSE 138* 139*  BUN 7 11  CREATININE 0.55* 1.02  CALCIUM 7.4* 7.5*    PT/INR:  Recent Labs  12/05/14 0517  LABPROT 17.0*  INR 1.37   ABG    Component Value Date/Time   PHART 7.434 12/06/2014 0610   HCO3 23.4 12/06/2014 0610   TCO2 24.5 12/06/2014 0610   ACIDBASEDEF 0.3 12/06/2014 0610   O2SAT 94.5 12/06/2014 0610   CBG (last 3)   Recent Labs  12/05/14 1800 12/05/14 2320 12/06/14 0540  GLUCAP 204* 241* 128*    Assessment/Plan: S/P Procedure(s) (LRB): VIDEO ASSISTED THORACOSCOPY (VATS)/EMPYEMA (N/A) VIDEO BRONCHOSCOPY (N/A) POD # 1 decortication   CV- in atrial fib, rate controlled  Still requiring neo for hypotension, suspect sepsis related  RESP- VDRF, CA pneumonia/ empyema  CXR shows good result, keep all 3 tubes for now  Vanco/ Zosyn  Vent per CCM, hopefully can wean today, if not will need to consider feeding tube  RENAL- creatinine and lytes OK  ENDO- CBG elevated, continue SSI  DVT prophylaxis- SCD, add enoxaparin   LOS: 4 days    Melrose Nakayama 12/06/2014

## 2014-12-06 NOTE — Progress Notes (Addendum)
PT Cancellation Note  Patient Details Name: Logan May MRN: [id number] DOB: 1946/01/22   Cancelled Treatment:    Reason Eval/Treat Not Completed: Patient not medically ready. Pt underwent VATS since initial PT evaluation. Pt likely to be extubated this AM per RN. MD please advise if you would like PT to continue. Will continue to follow per MD orders.   Rolinda Roan 12/06/2014, 8:49 AM   Rolinda Roan, PT, DPT Acute Rehabilitation Services Pager: [phone]

## 2014-12-06 NOTE — Progress Notes (Signed)
Name: Logan May MRN: [id number] DOB: [birth year]    ADMISSION DATE:  12/02/2014 CONSULTATION DATE:  12/03/2014  REFERRING MD :  Dr. Grandville Silos [facility]  CHIEF COMPLAINT:  DOE  BRIEF PATIENT DESCRIPTION: 69 year old male admitted 7/28 with complaints of productive cough and SOB x 3 weeks. Admitted 7/28 for R sided CAP with associated effusion on CXR. PCCM consulted for evaluation of effusion.   SIGNIFICANT EVENTS  7/31 rt vats for empyema with VDRF and hypotesion  STUDIES:  CT Chest 7/29:  Loculated R pleural effusion with gas formation.    SUBJECTIVE:   Vent weaned to 40% Fio2. Still on neo. Did not do well on 10/5 weaning trial today AM.  VITAL SIGNS: Temp:  [98 F (36.7 C)-100.1 F (37.8 C)] 98 F (36.7 C) (08/01 0723) Pulse Rate:  [36-141] 86 (08/01 0900) Resp:  [0-25] 17 (08/01 0900) BP: (83-147)/(44-107) 104/59 mmHg (08/01 0900) SpO2:  [95 %-100 %] 100 % (08/01 0900) Arterial Line BP: (68-156)/(44-59) 68/57 mmHg (07/31 1700) FiO2 (%):  [1 %-100 %] 40 % (08/01 0810) Weight:  [185 lb 3 oz (84 kg)] 185 lb 3 oz (84 kg) (08/01 0700)  PHYSICAL EXAMINATION: General:  Arousable. No apparent distress. HEENT:  [place]/AT, PERRL, no JVD,OTT->. 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 Skin: rt vats dressing intact   Recent Labs Lab 12/04/14 0524 12/05/14 0517 12/05/14 1345 12/06/14 0458  NA 141 142 144 139  K 3.0* 3.5 3.8 3.9  CL 109 108  --  109  CO2 27 27  --  25  BUN 9 7  --  11  CREATININE 0.63 0.55*  --  1.02  GLUCOSE 166* 138*  --  139*    Recent Labs Lab 12/04/14 1636 12/05/14 0517 12/05/14 1345 12/06/14 0458  HGB 10.1* 9.9* 9.9* 9.9*  HCT 31.4* 31.1* 29.0* 31.2*  WBC 17.8* 14.0*  --  25.8*  PLT 424* 403*  --  488*   I reviewed X ray from today- Shows a  persistent lt base opacity. Lines and tubes are in place.   ASSESSMENT / PLAN:  PULMONARY A: Rt empyema post VATS/Thora COPD/Empysema  Vent bundle. Retry 10/5 weaning trial later today but does not look ready for extubation yet.   CARDIOVASCULAR A:  Hypotension Afib with RVR.  Wean down Neo drip as tolerated.  Can use amio for afib if HR is high.   RENAL A:  No acute issue  GASTROINTESTINAL A:   GI protection P:   PPI Start tube feeds via OGT.  HEMATOLOGIC A:   No acute issue P:  Monitor for blood loss anemia  INFECTIOUS A:   Rt empyema. All cultures are negative so far.   BCx2 7/31>> UC 7/31 >> Sputum7/31>> Abx:  7/30 vanc>> 7/30 Pip-tazo>>  ENDOCRINE A:   DM   SSI  NEUROLOGIC A:  Intubated and sedated overnight   RASS goal: -1 Sedated on vent RASS-1  DVT prophylaxis with lovenox Peridex mouth washes.  FAMILY  - Updates: None at bedside.  CRITICAL CARE Performed by: Marshell Garfinkel  Total critical care time: 45  Critical care time was exclusive of separately billable procedures and treating other patients.  Critical care was necessary to treat or prevent imminent or life-threatening deterioration.  Critical care was time spent personally by  me on the following activities: development of treatment plan with patient and/or surrogate as well as nursing, discussions with consultants, evaluation of patient's response to treatment, examination of patient, obtaining history from patient or surrogate, ordering and performing treatments and interventions, ordering and review of laboratory studies, ordering and review of radiographic studies, pulse oximetry and re-evaluation of patient's condition.  Marshell Garfinkel, MD [facility] Cell: [phone] After 3pm or if no response, call [phone]

## 2014-12-06 NOTE — Progress Notes (Addendum)
Nutrition Consult/Follow Up  DOCUMENTATION CODES:   Not applicable  INTERVENTION:   Initiate Vital AF 1.2 formula at 20 ml/hr and increase by 10 ml every 4 hours to goal rate of 60 ml/hr   Prostat liquid protein 30 ml daily  Total TF regimen to provide 1828 kcals, 123 gm protein, 1162 ml of free water  NUTRITION DIAGNOSIS:   Inadequate oral intake now related to inability to eat as evidenced by NPO status, ongoing   GOAL:   Patient will meet greater than or equal to 90% of their needs, currently unmet  MONITOR:   TF tolerance, Vent status, Labs, Weight trends, I & O's  ASSESSMENT:   Logan [name] Diabetes type 2 with Charcot foot supposedly diet-controlled, hypertension, generalized anxiety disorder, history of atrial fibrillation not on anticoagulation presumably secondary to falls. Patient presents to the hospital with 3 weeks of worsening productive cough with purulent sputum. Guaifenesin has been helpful, but has not resolved his cough. He also has been having increased weakness with some mild dyspnea on exertion which is improved with rest. No other provoking her. In factors.   Patient s/p procedures 7/31: RIGHT VIDEO-ASSISTED THORACOSCOPY DRAINAGE RIGHT EMPYEMA VISCERAL & PARIETAL PLEURAL DECORTICATION  Patient is currently intubated on ventilator support -- OGT in place MV: 8.1 L/min Temp (24hrs), Avg:98.7 F (37.1 C), Min:98 F (36.7 C), Max:100.1 F (37.8 C)   Noted prior to intubation, pt identified with severe aspiration risk with MBSS pending.  RD consulted for TF initiation & management.  RD unable to complete Nutrition Focused Physical Exam at this time.  Diet Order:  NPO  Skin:  Wound (see comment) (stage I pressure ulcer buttocks)  Last BM:  7/30  Height:   Ht Readings from Last 1 Encounters:  12/02/14 5\' 11"  (1.803 m)    Weight:   Wt Readings from Last 1 Encounters:  12/06/14 185 lb 3 oz (84 kg)    Ideal Body  Weight:  78.2 kg  BMI:  Body mass index is 25.84 kg/(m^2).  Estimated Nutritional Needs:   Kcal:  1911  Protein:  125-135 gm  Fluid:  per MD  EDUCATION NEEDS:   No education needs identified at this time  Arthur Holms, RD, LDN Pager #: [phone] After-Hours Pager #: [phone]

## 2014-12-06 NOTE — Progress Notes (Signed)
OT Cancellation Note  Patient Details Name: Logan May MRN: [id number] DOB: [birth year]   Cancelled Treatment:    Reason Eval/Treat Not Completed: Patient not medically ready. Pt underwent VATS since initial OT order, now intubated. Will need new OT order.  Malka So 12/06/2014, 8:15 AM  [phone]

## 2014-12-06 NOTE — Care Management Important Message (Signed)
Important Message  Patient Details  Name: Logan May MRN: [id number] Date of Birth: 11-Feb-1946   Medicare Important Message Given:  [facility] notification given    Nathen May 12/06/2014, 12:03 [place] Message  Patient Details  Name: Logan May MRN: [id number] Date of Birth: 18-Mar-1946   Medicare Important Message Given:  Yes-second notification given    Nathen May 12/06/2014, 12:03 PM

## 2014-12-07 ENCOUNTER — Telehealth: Payer: Self-pay | Admitting: Family Medicine

## 2014-12-07 ENCOUNTER — Inpatient Hospital Stay (HOSPITAL_COMMUNITY): Payer: Medicare Other

## 2014-12-07 LAB — COMPREHENSIVE METABOLIC PANEL
ALBUMIN: 1.4 g/dL — AB (ref 3.5–5.0)
ALT: 19 U/L (ref 17–63)
ANION GAP: 8 (ref 5–15)
AST: 32 U/L (ref 15–41)
Alkaline Phosphatase: 71 U/L (ref 38–126)
BUN: 16 mg/dL (ref 6–20)
CALCIUM: 6.8 mg/dL — AB (ref 8.9–10.3)
CHLORIDE: 106 mmol/L (ref 101–111)
CO2: 22 mmol/L (ref 22–32)
CREATININE: 1.19 mg/dL (ref 0.61–1.24)
GFR calc Af Amer: >60 mL/min (ref >60)
GLUCOSE: 217 mg/dL — AB (ref 65–99)
Potassium: 3.9 mmol/L (ref 3.5–5.1)
Sodium: 136 mmol/L (ref 135–145)
Total Bilirubin: 0.6 mg/dL (ref 0.3–1.2)
Total Protein: 4.4 g/dL — ABNORMAL LOW (ref 6.5–8.1)

## 2014-12-07 LAB — CBC
HCT: 28.7 % — ABNORMAL LOW (ref 39.0–52.0)
Hemoglobin: 9.1 g/dL — ABNORMAL LOW (ref 13.0–17.0)
MCH: 28.6 pg (ref 26.0–34.0)
MCHC: 31.7 g/dL (ref 30.0–36.0)
MCV: 90.3 fL (ref 78.0–100.0)
PLATELETS: 404 10*3/uL — AB (ref 150–400)
RBC: 3.18 MIL/uL — ABNORMAL LOW (ref 4.22–5.81)
RDW: 14.9 % (ref 11.5–15.5)
WBC: 20.4 10*3/uL — ABNORMAL HIGH (ref 4.0–10.5)

## 2014-12-07 LAB — CULTURE, BAL-QUANTITATIVE W GRAM STAIN
Culture: NO GROWTH
Gram Stain: NONE SEEN

## 2014-12-07 LAB — GLUCOSE, CAPILLARY
GLUCOSE-CAPILLARY: 95 mg/dL (ref 65–99)
GLUCOSE-CAPILLARY: 106 mg/dL — AB (ref 65–99)
Glucose-Capillary: 185 mg/dL — ABNORMAL HIGH (ref 65–99)
Glucose-Capillary: 168 mg/dL — ABNORMAL HIGH (ref 65–99)
Glucose-Capillary: 207 mg/dL — ABNORMAL HIGH (ref 65–99)
Glucose-Capillary: 138 mg/dL — ABNORMAL HIGH (ref 65–99)

## 2014-12-07 LAB — CULTURE, BLOOD (ROUTINE X 2)
CULTURE: NO GROWTH
CULTURE: NO GROWTH

## 2014-12-07 LAB — CULTURE, BAL-QUANTITATIVE: COLONY COUNT: NO GROWTH

## 2014-12-07 LAB — VANCOMYCIN, TROUGH: VANCOMYCIN TR: 19 ug/mL (ref 10.0–20.0)

## 2014-12-07 MED ORDER — CETYLPYRIDINIUM CHLORIDE 0.05 % MT LIQD
7.0000 mL | Freq: Two times a day (BID) | OROMUCOSAL | Status: DC
  Administered 2014-12-08 – 2014-12-09 (×3): 7 mL via OROMUCOSAL

## 2014-12-07 MED ORDER — AMIODARONE HCL IN DEXTROSE 360-4.14 MG/200ML-% IV SOLN
30.0000 mg/h | INTRAVENOUS | Status: DC
  Administered 2014-12-07 – 2014-12-09 (×5): 30 mg/h via INTRAVENOUS
  Filled 2014-12-07 (×14): qty 200

## 2014-12-07 MED ORDER — CHLORHEXIDINE GLUCONATE 0.12 % MT SOLN
15.0000 mL | Freq: Two times a day (BID) | OROMUCOSAL | Status: DC
  Administered 2014-12-08 – 2014-12-10 (×4): 15 mL via OROMUCOSAL
  Filled 2014-12-07 (×4): qty 15

## 2014-12-07 MED ORDER — AMIODARONE HCL IN DEXTROSE 360-4.14 MG/200ML-% IV SOLN
60.0000 mg/h | INTRAVENOUS | Status: DC
  Administered 2014-12-07 (×2): 60 mg/h via INTRAVENOUS
  Filled 2014-12-07: qty 200

## 2014-12-07 MED ORDER — AMIODARONE LOAD VIA INFUSION
150.0000 mg | Freq: Once | INTRAVENOUS | Status: AC
  Administered 2014-12-07: 150 mg via INTRAVENOUS
  Filled 2014-12-07: qty 83.34

## 2014-12-07 MED ORDER — FENTANYL CITRATE (PF) 100 MCG/2ML IJ SOLN
50.0000 ug | INTRAMUSCULAR | Status: DC | PRN
  Administered 2014-12-08 – 2014-12-09 (×3): 50 ug via INTRAVENOUS
  Filled 2014-12-07 (×3): qty 2

## 2014-12-07 NOTE — Progress Notes (Signed)
2 Days Post-Op Procedure(s) (LRB): VIDEO ASSISTED THORACOSCOPY (VATS)/EMPYEMA (N/A) VIDEO BRONCHOSCOPY (N/A) Subjective: Intubated, awake, follows commands  Objective: Vital signs in last 24 hours: Temp:  [98.1 F (36.7 C)-100.3 F (37.9 C)] 98.5 F (36.9 C) (08/02 0410) Pulse Rate:  [76-133] 87 (08/02 0730) Cardiac Rhythm:  [-] Atrial fibrillation (08/02 0500) Resp:  [8-29] 15 (08/02 0730) BP: (70-134)/(41-104) 95/55 mmHg (08/02 0730) SpO2:  [100 %] 100 % (08/02 0730) FiO2 (%):  [40 %] 40 % (08/02 0730) Weight:  [185 lb 13.6 oz (84.3 kg)] 185 lb 13.6 oz (84.3 kg) (08/02 0448)  Hemodynamic parameters for last 24 hours: CVP:  [5 mmHg-12 mmHg] 5 mmHg  Intake/Output from previous day: 08/01 0701 - 08/02 0700 In: 4577.2 [I.V.:2830.4; NG/GT:696.8; IV Piggyback:1050] Out: 3016 [Urine:1295; Chest Tube:90] Intake/Output this shift:    General appearance: cooperative Neurologic: follows commands Heart: irregularly irregular rhythm Lungs: rhonchi bilaterally Abdomen: normal findings: soft, non-tender no air leak, serosanguinous drainage  Lab Results:  Recent Labs  12/06/14 0458 12/07/14 0427  WBC 25.8* 20.4*  HGB 9.9* 9.1*  HCT 31.2* 28.7*  PLT 488* 404*   BMET:  Recent Labs  12/06/14 0458 12/07/14 0427  NA 139 136  K 3.9 3.9  CL 109 106  CO2 25 22  GLUCOSE 139* 217*  BUN 11 16  CREATININE 1.02 1.19  CALCIUM 7.5* 6.8*    PT/INR:  Recent Labs  12/05/14 0517  LABPROT 17.0*  INR 1.37   ABG    Component Value Date/Time   PHART 7.434 12/06/2014 0610   HCO3 23.4 12/06/2014 0610   TCO2 24.5 12/06/2014 0610   ACIDBASEDEF 0.3 12/06/2014 0610   O2SAT 94.5 12/06/2014 0610   CBG (last 3)   Recent Labs  12/06/14 1935 12/06/14 2339 12/07/14 0353  GLUCAP 126* 125* 185*    Assessment/Plan: S/P Procedure(s) (LRB): VIDEO ASSISTED THORACOSCOPY (VATS)/EMPYEMA (N/A) VIDEO BRONCHOSCOPY (N/A) POD # 2  CV- in rapid a fib this AM, BP on the low side  Will  start amiodarone  May need anticoagulation, will continue with low dose enoxaparin for now, too soon for full dose anticoagulation  RESP_ VDRF, pneumonia, empyema  Vent per CCM  CXR shows improved aeration R lung this AM  RENAL- creatinine and lytes Ok  GI/Nutrition- sever protein calorie malnutrition, TF initiated  ID- On vanco/ zosyn, GS showed GPC in pairs/ chains, ID pending  DM- CBG elevated this AM  SCD + enoxaparin for DVT prophylaxis   LOS: 5 days    Melrose Nakayama 12/07/2014

## 2014-12-07 NOTE — Progress Notes (Signed)
  Amiodarone Drug - Drug Interaction Consult Note  Recommendations: NO SPECIFIC INTERACTIONS  Amiodarone is metabolized by the cytochrome P450 system and therefore has the potential to cause many drug interactions. Amiodarone has an average plasma half-life of 50 days (range 20 to 100 days).   There is potential for drug interactions to occur several weeks or months after stopping treatment and the onset of drug interactions may be slow after initiating amiodarone.   [x]  Statins (ATORVASTATIN): Increased risk of myopathy. Simvastatin- restrict dose to 20mg  daily. Other statins: counsel patients to report any muscle pain or weakness immediately.  []  Anticoagulants: Amiodarone can increase anticoagulant effect. Consider warfarin dose reduction. Patients should be monitored closely and the dose of anticoagulant altered accordingly, remembering that amiodarone levels take several weeks to stabilize.  []  Antiepileptics: Amiodarone can increase plasma concentration of phenytoin, the dose should be reduced. Note that small changes in phenytoin dose can result in large changes in levels. Monitor patient and counsel on signs of toxicity.  []  Beta blockers: increased risk of bradycardia, AV block and myocardial depression. Sotalol - avoid concomitant use.  []   Calcium channel blockers (diltiazem and verapamil): increased risk of bradycardia, AV block and myocardial depression.  []   Cyclosporine: Amiodarone increases levels of cyclosporine. Reduced dose of cyclosporine is recommended.  []  Digoxin dose should be halved when amiodarone is started.  []  Diuretics: increased risk of cardiotoxicity if hypokalemia occurs.  []  Oral hypoglycemic agents (glyburide, glipizide, glimepiride): increased risk of hypoglycemia. Patient's glucose levels should be monitored closely when initiating amiodarone therapy.   []  Drugs that prolong the QT interval:  Torsades de pointes risk may be increased with concurrent  use - avoid if possible.  Monitor QTc, also keep magnesium/potassium WNL if concurrent therapy can't be avoided. Marland Kitchen Antibiotics: e.g. fluoroquinolones, erythromycin. . Antiarrhythmics: e.g. quinidine, procainamide, disopyramide, sotalol. . Antipsychotics: e.g. phenothiazines, haloperidol.  . Lithium, tricyclic antidepressants, and methadone.  Thank You,  Wynona Neat, PharmD, BCPS 12/07/2014 7:51 AM

## 2014-12-07 NOTE — Progress Notes (Signed)
RN called RT about MD wanting pt on 0/0 PS. RT explained to RN that hospital protocol, signed by Medical Director states 5/5 PS is what we use to evaluate for extubation. RT called RT director for clarification and stated we must follow protocol. RN aware

## 2014-12-07 NOTE — Significant Event (Signed)
Patient acting confused and verbally aggressive toward staff. However, he is able to answer orientation questions correctly except for the year. He is sitting in the chair now and has recently started to try to get out of chair and pulling on his EKG leads. Requested for safety sitter who arrived to the unit around 1500. Has had a chair alarm on since he is up in the chair. Updated patient's status to Dr. Vaughan Browner who came to unit. Patient would turned around and be very pleasant and compliant. Will continue to monitor patient. Rashad Auld, Therapist, sports.

## 2014-12-07 NOTE — Progress Notes (Signed)
Name: Logan May MRN: [id number] DOB: 12-Oct-1945    ADMISSION DATE:  12/02/2014 CONSULTATION DATE:  12/03/2014  REFERRING MD :  Dr. Grandville Silos [facility]  CHIEF COMPLAINT:  DOE  BRIEF PATIENT DESCRIPTION: 69 year old male admitted 7/28 with complaints of productive cough and SOB x 3 weeks. Admitted 7/28 for R sided CAP with associated effusion on CXR. PCCM consulted for evaluation of effusion.   SIGNIFICANT EVENTS  7/31 rt vats for empyema with VDRF and hypotesion  STUDIES:  CT Chest 7/29:  Loculated R pleural effusion with gas formation.    SUBJECTIVE:   Still on Neo. Amiodarone started yesterday for rapid afib. Doing well on 5/5 weaning trial today.  VITAL SIGNS: Temp:  [98.1 F (36.7 C)-100.3 F (37.9 C)] 98.3 F (36.8 C) (08/02 0754) Pulse Rate:  [76-133] 87 (08/02 0730) Resp:  [8-29] 15 (08/02 0730) BP: (70-134)/(41-104) 95/55 mmHg (08/02 0730) SpO2:  [100 %] 100 % (08/02 0730) FiO2 (%):  [40 %] 40 % (08/02 0730) Weight:  [185 lb 13.6 oz (84.3 kg)] 185 lb 13.6 oz (84.3 kg) (08/02 0448)  PHYSICAL EXAMINATION: General:  Arousable. No apparent distress. HEENT:  PERRLA, moist mucus membranes Cardiovascular: Irregular rate. No MRG Lungs: Rt chest tube to suction. 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 Skin: rt vats dressing intact   Recent Labs Lab 12/05/14 0517 12/05/14 1345 12/06/14 0458 12/07/14 0427  NA 142 144 139 136  K 3.5 3.8 3.9 3.9  CL 108  --  109 106  CO2 27  --  25 22  BUN 7  --  11 16  CREATININE 0.55*  --  1.02 1.19  GLUCOSE 138*  --  139* 217*    Recent Labs Lab 12/05/14 0517 12/05/14 1345 12/06/14 0458 12/07/14 0427  HGB 9.9* 9.9* 9.9* 9.1*  HCT 31.1* 29.0* 31.2* 28.7*  WBC 14.0*  --  25.8* 20.4*  PLT 403*  --  488* 404*   I reviewed X ray from today- Shows a persistent rt base opacity with improved aeration. Lines and tubes are in place.   ASSESSMENT  / PLAN:  PULMONARY A: Rt empyema post VATS/Thora COPD/Empysema  Vent bundle. Will attempt extubation today.  CARDIOVASCULAR A:  Hypotension Afib with RVR.  Wean down Neo drip as tolerated.  Amio drip for rapid afib. He is not on anticoagulation as an outpatient for unclear reasons. He will benefit from a cardiology consult for management of a fib.  RENAL A:  No acute issue  GASTROINTESTINAL A:   GI protection P:   PPI Start tube feeds via OGT.  HEMATOLOGIC A:   No acute issue P:  Monitor for blood loss anemia  INFECTIOUS A:   Rt empyema. All cultures are negative so far.   BCx2 7/31>> UC 7/31 >> Sputum7/31>> Abx:  7/30 vanc>> 7/30 Pip-tazo>>  ENDOCRINE A:   DM   SSI  NEUROLOGIC A:  Intubated and sedated overnight    DVT prophylaxis with lovenox Peridex mouth washes.  FAMILY  - Updates: None at bedside.  CRITICAL CARE Performed by: Marshell Garfinkel  Total critical care time: 45  Critical care time was exclusive of separately billable procedures and treating other patients.  Critical care was necessary to treat or prevent imminent or life-threatening deterioration.  Critical care was time spent personally by me on the following activities: development of treatment plan with patient and/or surrogate as well as nursing, discussions with  consultants, evaluation of patient's response to treatment, examination of patient, obtaining history from patient or surrogate, ordering and performing treatments and interventions, ordering and review of laboratory studies, ordering and review of radiographic studies, pulse oximetry and re-evaluation of patient's condition.  Marshell Garfinkel, MD [place] [facility] Cell: [phone] After 3pm or if no response, call [phone]

## 2014-12-07 NOTE — Progress Notes (Signed)
ANTIBIOTIC CONSULT NOTE - FOLLOW UP  Pharmacy Consult for Vancomycin and Zosyn Indication: empyema/pneumonia  Allergies  Allergen Reactions  . Tetanus Toxoids Other (See Comments)    "Blacked out"    Patient Measurements: Height: 5\' 11"  (180.3 cm) Weight: 185 lb 13.6 oz (84.3 kg) IBW/kg (Calculated) : 75.3  Vital Signs: Temp: 98.7 F (37.1 C) (08/02 1149) Temp Source: Oral (08/02 1149) BP: 115/70 mmHg (08/02 1500) Pulse Rate: 126 (08/02 1500) Intake/Output from previous day: 08/01 0701 - 08/02 0700 In: 4707.2 [I.V.:2930.4; NG/GT:726.8; IV Piggyback:1050] Out: 6440 [Urine:1325; Chest Tube:90] Intake/Output from this shift: Total I/O In: 1184.1 [I.V.:1031.6; NG/GT:90; IV Piggyback:62.5] Out: 230 [Urine:180; Chest Tube:50]  Labs:  Recent Labs  12/05/14 0517 12/05/14 1345 12/06/14 0458 12/07/14 0427  WBC 14.0*  --  25.8* 20.4*  HGB 9.9* 9.9* 9.9* 9.1*  PLT 403*  --  488* 404*  CREATININE 0.55*  --  1.02 1.19   Estimated Creatinine Clearance: 61.5 mL/min (by C-G formula based on Cr of 1.19).  Recent Labs  12/07/14 1040  [place] 19     Microbiology: Recent Results (from the past 720 hour(s))  Culture, blood (routine x 2)     Status: None   Collection Time: 12/02/14  9:17 AM  Result Value Ref Range Status   Specimen Description BLOOD LEFT ANTECUBITAL  Final   Special Requests BOTTLES DRAWN AEROBIC AND ANAEROBIC 5CC  Final   Culture NO GROWTH 5 DAYS  Final   Report Status 12/07/2014 FINAL  Final  Culture, blood (routine x 2)     Status: None   Collection Time: 12/02/14 12:14 PM  Result Value Ref Range Status   Specimen Description BLOOD LEFT ANTECUBITAL  Final   Special Requests BOTTLES DRAWN AEROBIC AND ANAEROBIC 5CC  Final   Culture NO GROWTH 5 DAYS  Final   Report Status 12/07/2014 FINAL  Final  Clostridium Difficile by PCR (not at [facility])     Status: None   Collection Time: 12/03/14 11:17 AM  Result Value Ref Range Status   Toxigenic C Difficile by  pcr NEGATIVE NEGATIVE Final  Culture, Urine     Status: None   Collection Time: 12/03/14  3:16 PM  Result Value Ref Range Status   Specimen Description URINE, RANDOM  Final   Special Requests NONE  Final   Culture 30,000 COLONIES/mL ENTEROCOCCUS SPECIES  Final   Report Status 12/06/2014 FINAL  Final   Organism ID, Bacteria ENTEROCOCCUS SPECIES  Final      Susceptibility   Enterococcus species - MIC*    AMPICILLIN <=2 SENSITIVE Sensitive     LEVOFLOXACIN 1 SENSITIVE Sensitive     NITROFURANTOIN <=16 SENSITIVE Sensitive     VANCOMYCIN <=0.5 SENSITIVE Sensitive     LINEZOLID 2 SENSITIVE Sensitive     * 30,000 COLONIES/mL ENTEROCOCCUS SPECIES  Gram stain     Status: None   Collection Time: 12/04/14 12:11 PM  Result Value Ref Range Status   Specimen Description PLEURAL  Final   Special Requests NONE  Final   Gram Stain   Final    ABUNDANT WBC PRESENT,BOTH PMN AND MONONUCLEAR FEW GRAM POSITIVE COCCI IN CHAINS IN CLUSTERS FEW GRAM NEGATIVE COCCI IN CHAINS IN CLUSTERS CRITICAL RESULT CALLED TO, READ BACK BY AND VERIFIED WITH: SC[place],S RN 12/04/14 1927 [place] CONFIRMED BY V WILKINS    Report Status 12/04/2014 FINAL  Final  Fungus Culture with Smear     Status: None (Preliminary result)   Collection Time: 12/04/14 12:11 PM  Result Value Ref Range Status   Specimen Description PLEURAL  Final   Special Requests NONE  Final   Fungal Smear   Final    NO YEAST OR FUNGAL ELEMENTS SEEN Performed at [facility]    Culture   Final    CULTURE IN PROGRESS FOR FOUR WEEKS Performed at [facility]    Report Status PENDING  Incomplete  Culture, body fluid-bottle     Status: None (Preliminary result)   Collection Time: 12/04/14 12:11 PM  Result Value Ref Range Status   Specimen Description PLEURAL  Final   Special Requests NONE  Final   Culture NO GROWTH 3 DAYS  Final   Report Status PENDING  Incomplete  Surgical pcr screen     Status: None   Collection Time: 12/05/14   1:23 AM  Result Value Ref Range Status   MRSA, PCR NEGATIVE NEGATIVE Final   Staphylococcus aureus NEGATIVE NEGATIVE Final    Comment:        The Xpert SA Assay (FDA approved for NASAL specimens in patients over 12 years of age), is one component of a comprehensive surveillance program.  Test performance has been validated by [facility] for patients greater than or equal to 29 year old. It is not intended to diagnose infection nor to guide or monitor treatment.   Culture, bal-quantitative     Status: None   Collection Time: 12/05/14 10:09 AM  Result Value Ref Range Status   Specimen Description BRONCHIAL ALVEOLAR LAVAGE  Final   Special Requests BRONCHIAL WASHINGS  Final   Gram Stain   Final    NO WBC SEEN NO SQUAMOUS EPITHELIAL CELLS SEEN NO ORGANISMS SEEN Performed at [facility]    Colony Count NO GROWTH Performed at [facility]   Final   Culture   Final    NO GROWTH 2 DAYS Performed at [facility]    Report Status 12/07/2014 FINAL  Final  Fungus Culture with Smear     Status: None (Preliminary result)   Collection Time: 12/05/14 10:09 AM  Result Value Ref Range Status   Specimen Description BRONCHIAL ALVEOLAR LAVAGE  Final   Special Requests BRONCHIAL WASHINGS  Final   Fungal Smear   Final    NO YEAST OR FUNGAL ELEMENTS SEEN Performed at [facility]    Culture   Final    CULTURE IN PROGRESS FOR FOUR WEEKS Performed at [facility]    Report Status PENDING  Incomplete  AFB culture with smear     Status: None (Preliminary result)   Collection Time: 12/05/14 10:09 AM  Result Value Ref Range Status   Specimen Description BRONCHIAL ALVEOLAR LAVAGE  Final   Special Requests BRONCHIAL WASHINGS  Final   Acid Fast Smear   Final    NO ACID FAST BACILLI SEEN Performed at [facility]    Culture   Final    CULTURE WILL BE EXAMINED FOR 6 WEEKS BEFORE ISSUING A FINAL REPORT Performed at [facility]     Report Status PENDING  Incomplete  AFB culture with smear     Status: None (Preliminary result)   Collection Time: 12/05/14 10:42 AM  Result Value Ref Range Status   Specimen Description PLEURAL RIGHT FLUID  Final   Special Requests SPEC B  Final   Acid Fast Smear   Final    NO ACID FAST BACILLI SEEN Performed at [facility]  Final    CULTURE WILL BE EXAMINED FOR 6 WEEKS BEFORE ISSUING A FINAL REPORT Performed at [facility]    Report Status PENDING  Incomplete  Anaerobic culture     Status: None (Preliminary result)   Collection Time: 12/05/14 10:42 AM  Result Value Ref Range Status   Specimen Description PLEURAL RIGHT FLUID  Final   Special Requests SPEC D  Final   Gram Stain   Final    FEW WBC PRESENT, PREDOMINANTLY PMN NO SQUAMOUS EPITHELIAL CELLS SEEN FEW GRAM POSITIVE COCCI IN PAIRS    Culture   Final    NO ANAEROBES ISOLATED; CULTURE IN PROGRESS FOR 5 DAYS   Report Status PENDING  Incomplete  Body fluid culture     Status: None (Preliminary result)   Collection Time: 12/05/14 10:42 AM  Result Value Ref Range Status   Specimen Description PLEURAL  Final   Special Requests NONE  Final   Gram Stain   Final    DEGENERATE WBC PRESENT ON SLIDE FEW GRAM VARIABLE COCCI CRITICAL RESULT CALLED TO, READ BACK BY AND VERIFIED WITH: [place] RN 12/05/14 2050 [place] BY V WILKINS    Culture   Final    ABUNDANT MICROAEROPHILIC STREPTOCOCCI Standardized susceptibility testing for this organism is not available.    Report Status PENDING  Incomplete  Tissue culture     Status: None (Preliminary result)   Collection Time: 12/05/14 10:51 AM  Result Value Ref Range Status   Specimen Description TISSUE  Final   Special Requests RIGHT PLEURAL PEEL CUP C PT ON VANC  Final   Gram Stain   Final    MODERATE WBC PRESENT, PREDOMINANTLY PMN NO SQUAMOUS EPITHELIAL CELLS SEEN ABUNDANT GRAM POSITIVE COCCI IN PAIRS IN CHAINS Performed at [facility]    Culture   Final    ABUNDANT MICROAEROPHILIC STREPTOCOCCI Performed at [facility]    Report Status PENDING  Incomplete  Fungus Culture with Smear     Status: None (Preliminary result)   Collection Time: 12/05/14 10:51 AM  Result Value Ref Range Status   Specimen Description TISSUE  Final   Special Requests RIGHT PLEURAL PEEL CUP C PT ON VANC  Final   Fungal Smear   Final    NO YEAST OR FUNGAL ELEMENTS SEEN Performed at [facility]    Culture   Final    CULTURE IN PROGRESS FOR FOUR WEEKS Performed at [facility]    Report Status PENDING  Incomplete  AFB culture with smear     Status: None (Preliminary result)   Collection Time: 12/05/14 10:51 AM  Result Value Ref Range Status   Specimen Description TISSUE  Final   Special Requests RIGHT PLEURAL PEEL CUP C PT ON VANC  Final   Acid Fast Smear   Final    NO ACID FAST BACILLI SEEN Performed at [facility]    Culture   Final    CULTURE WILL BE EXAMINED FOR 6 WEEKS BEFORE ISSUING A FINAL REPORT Performed at [facility]    Report Status PENDING  Incomplete   Assessment:  Day # 4 Vancomycin and Zosyn for empyema/pneumonia coverage.  POD # 2 VATS/ drainage of empyema.  7/31 pleural cultures growing Microaerophilic Strep. Should be covered with Vanc and Zosyn  Also had 30 K/ml Enterococcus in 7/29 urine culture. MIC to Vanc < 0.5.   Vancomycin trough level prior to am dose today is 19 mcg/ml, at goal.  BUN/creatinine trended up some. I/O +  Goal of Therapy:  Vancomycin trough level 15-20 mcg/ml appropriate Zosyn dose for renal function and infection  Plan:   Continue Vancomycin 1 gram IV q12hrs.  Continue Zosyn 3.375 gm IV q8hrs (each over 4 hrs).  Will follow renal function, culture data, progress.  Will consider rechecking Vanc trough in a few days if BUN/Creatinine continue to trend up.  Arty Baumgartner, [place] Pager: [phone] 12/07/2014,3:17 PM

## 2014-12-07 NOTE — Plan of Care (Signed)
Problem: Phase III Progression Outcomes Goal: Activity at appropriate level-compared to baseline (UP IN CHAIR FOR HEMODIALYSIS)  Outcome: Progressing Utilized a lift for out of bed to chair today-not able to sit up/dangle without support.   Problem: Discharge Progression Outcomes Goal: Tolerating diet Outcome: Not Progressing Will need speech evaluation. High risk for aspiration.

## 2014-12-07 NOTE — Clinical Social Work Note (Signed)
CSW continuing to follow patient, patient has been faxed out.  Jones Broom. [place], MSW, [place] 12/07/2014 5:13 PM

## 2014-12-07 NOTE — Progress Notes (Signed)
CT surgery p.m. Rounds  Patient doing well after recently being extubated Being transferred from chair back to bed using the [facility] lift Breathing comfortably on nasal cannula

## 2014-12-07 NOTE — Progress Notes (Signed)
Bed offers given to pt.

## 2014-12-07 NOTE — Procedures (Signed)
Extubation Procedure Note  Patient Details:   Name: Logan May DOB: [birth year] MRN: [id number]   Airway Documentation:     Evaluation  O2 sats: stable throughout Complications: No apparent complications Patient did tolerate procedure well. Bilateral Breath Sounds: Clear, Diminished Suctioning: Oral, Airway Yes    Pt extubated to 3L [place] per Telephone Order from Dr Vaughan Browner. Pt stable throughout with no complications. Pt has copious thick yellow secretions which appeared to be blocking the end of the ett upon extubation. Pt has been told to cough up secretions and to use [place] to get it out instead of swallowing. Worrisome for aspiration. RT will continue to monitor.    Laymond Purser M 12/07/2014, 10:03 AM

## 2014-12-07 NOTE — Progress Notes (Signed)
eLink Pharmacist-Brief Progress Note Patient Name: Logan May DOB: 1946/02/22 MRN: [id number]   Date of Service  12/07/2014  HPI/Events of Note  Pt extubated, not on Protonix at home  eICU Interventions  D/c Protonix    Spoke with Dr. Stevenson Clinch, will d/c Protonix   Cassie L. Nicole Kindred, PharmD Clinical Pharmacy Resident Pager: [phone] 12/07/2014 5:18 PM

## 2014-12-07 NOTE — Care Management Note (Signed)
Case Management Note  Patient Details  Name: RANDAL GOENS MRN: [id number] Date of Birth: 1946/01/16  Subjective/Objective:        Talked with wife at bedside.  She has a cast to the L forearm and bruising and cut to L side of forehead.  States she was not able to assist husband at home prior.  Agreeable to SNF.             Action/Plan:   Expected Discharge Date:                  Expected Discharge Plan:  Skilled Nursing Facility  In-House Referral:  Clinical Social Work  Discharge planning Services  CM Consult  Post Acute Care Choice:    Choice offered to:     DME Arranged:    DME Agency:     HH Arranged:    [place] Agency:     Status of Service:  In process, will continue to follow  Medicare Important Message Given:  Yes-second notification given Date Medicare IM Given:    Medicare IM give by:    Date Additional Medicare IM Given:    Additional Medicare Important Message give by:     If discussed at [place] of Stay Meetings, dates discussed:    Additional Comments:  Vergie Living, RN 12/07/2014, 11:11 AM

## 2014-12-07 NOTE — Telephone Encounter (Signed)
FYI

## 2014-12-07 NOTE — Progress Notes (Signed)
Physical Therapy Treatment Patient Details Name: Logan May MRN: [id number] DOB: 03-07-1946 Today's Date: 12/07/2014    History of Present Illness 69 y.o. male admitted to [facility] on 12/02/14 for cough, SOB, and weakness.  Dx with CAP, A-fib, weakness.  Pt with significant PMhx of anxiety, HTN, peripheral neuropathy, DM, diabetic charcot's foot, s/p surgery and wears shoes with inserts, and L toe amputation.  Pt now s/p thoracentesis and VATS, and ventillated.    PT Comments    Functional status re-evaluated this session and goals were updated. Pt was able to achieve EOB this session with +2 assist, and although demonstrated a R lateral lean in sitting, was able to hold without assist for brief periods. Pt stood ~5 seconds EOB with flexed knees/hips and +2 assist for support and balance. Will continue to follow and progress as able per POC.   Follow Up Recommendations  SNF;Supervision/Assistance - 24 hour     Equipment Recommendations  Rolling walker with 5" wheels    Recommendations for Other Services       Precautions / Restrictions Precautions Precautions: Fall Restrictions Weight Bearing Restrictions: No    Mobility  Bed Mobility Overal bed mobility: Needs Assistance Bed Mobility: Supine to Sit;Sit to Supine     Supine to sit: Max assist;+2 for physical assistance Sit to supine: Max assist;+2 for physical assistance   General bed mobility comments: Pt able to initiate LE movement towards EOB, however still required mod-max assist to get LE's off EOB. +2 max assist with bed pad to complete transition to/from EOB and achieve full sitting position.   Transfers Overall transfer level: Needs assistance Equipment used: 2 person hand held assist Transfers: Sit to/from Stand Sit to Stand: Mod assist;+2 physical assistance         General transfer comment: +2 assist for pt to stand briefly EOB x1. Increased time required to clear hips from the bed, however pt demonstrated  a good anterior lean for weight shift onto feet.   Ambulation/Gait             General Gait Details: Unable at this time   Financial trader Rankin (Stroke Patients Only)       Balance Overall balance assessment: Needs assistance Sitting-balance support: Feet supported;Bilateral upper extremity supported Sitting balance-Leahy Scale: Poor Sitting balance - Comments: Occasional assist required for static sitting EOB with bilateral UE support. Pt with consistent R lateral lean Postural control: Right lateral lean Standing balance support: Bilateral upper extremity supported Standing balance-Leahy Scale: Zero Standing balance comment: +2 assist required to maintain static standing balance.                     Cognition Arousal/Alertness: Lethargic;Suspect due to medications Behavior During Therapy: Flat affect Overall Cognitive Status: Difficult to assess                      Exercises General Exercises - Lower Extremity Long Arc Quad: 15 reps;Left;10 reps;Right Hip Flexion/Marching: 5 reps;Seated;Both    General Comments        Pertinent Vitals/Pain Pain Assessment: Faces Faces Pain Scale: No hurt    Home Living                      Prior Function            PT Goals (current goals can now be found  in the care plan section) Acute Rehab PT Goals Patient Stated Goal: to go home PT Goal Formulation: With patient/family Time For Goal Achievement: 12/17/14 Potential to Achieve Goals: Good Progress towards PT goals: Goals downgraded-see care plan    Frequency  Min 2X/week    PT Plan Current plan remains appropriate    Co-evaluation             End of Session Equipment Utilized During Treatment: Gait belt;Oxygen Activity Tolerance: Patient limited by fatigue Patient left: in bed;with call bell/phone within reach;with family/visitor present     Time: 9381-0175 PT Time Calculation  (min) (ACUTE ONLY): 26 min  Charges:  $Therapeutic Activity: 23-37 mins                    G Codes:      Rolinda Roan 2014-12-20, 9:52 AM  Rolinda Roan, PT, DPT Acute Rehabilitation Services Pager: [phone]

## 2014-12-07 NOTE — Significant Event (Signed)
Wasted approximately 175cc of fentanyl in sink (from sedation drip) and flushed-this is in witnessed by charge RN Shelba Flake.

## 2014-12-08 ENCOUNTER — Inpatient Hospital Stay (HOSPITAL_COMMUNITY): Payer: Medicare Other

## 2014-12-08 LAB — GLUCOSE, CAPILLARY
GLUCOSE-CAPILLARY: 107 mg/dL — AB (ref 65–99)
GLUCOSE-CAPILLARY: 117 mg/dL — AB (ref 65–99)
Glucose-Capillary: 136 mg/dL — ABNORMAL HIGH (ref 65–99)
Glucose-Capillary: 122 mg/dL — ABNORMAL HIGH (ref 65–99)
Glucose-Capillary: 84 mg/dL (ref 65–99)

## 2014-12-08 LAB — TYPE AND SCREEN
ABO/RH(D): O POS
Antibody Screen: NEGATIVE
UNIT DIVISION: 0
Unit division: 0

## 2014-12-08 LAB — BASIC METABOLIC PANEL
Anion gap: 7 (ref 5–15)
BUN: 12 mg/dL (ref 6–20)
CO2: 22 mmol/L (ref 22–32)
CREATININE: 1.02 mg/dL (ref 0.61–1.24)
Calcium: 7.1 mg/dL — ABNORMAL LOW (ref 8.9–10.3)
Chloride: 110 mmol/L (ref 101–111)
GFR calc Af Amer: >60 mL/min (ref >60)
GFR calc non Af Amer: >60 mL/min (ref >60)
Glucose, Bld: 129 mg/dL — ABNORMAL HIGH (ref 65–99)
Potassium: 3.7 mmol/L (ref 3.5–5.1)
Sodium: 139 mmol/L (ref 135–145)

## 2014-12-08 LAB — TISSUE CULTURE

## 2014-12-08 LAB — CBC
HEMATOCRIT: 26.1 % — AB (ref 39.0–52.0)
Hemoglobin: 8.3 g/dL — ABNORMAL LOW (ref 13.0–17.0)
MCH: 28.4 pg (ref 26.0–34.0)
MCHC: 31.8 g/dL (ref 30.0–36.0)
MCV: 89.4 fL (ref 78.0–100.0)
Platelets: 331 10*3/uL (ref 150–400)
RBC: 2.92 MIL/uL — ABNORMAL LOW (ref 4.22–5.81)
RDW: 15 % (ref 11.5–15.5)
WBC: 11 10*3/uL — ABNORMAL HIGH (ref 4.0–10.5)

## 2014-12-08 NOTE — Progress Notes (Signed)
Name: Logan May MRN: [id number] DOB: [birth year]    ADMISSION DATE:  12/02/2014 CONSULTATION DATE:  12/03/2014  REFERRING MD :  Dr. Grandville Silos [facility]  CHIEF COMPLAINT:  DOE  BRIEF PATIENT DESCRIPTION: 69 year old male admitted 7/28 with complaints of productive cough and SOB x 3 weeks. Admitted 7/28 for R sided CAP with associated effusion on CXR. PCCM consulted for evaluation of effusion.   SIGNIFICANT EVENTS  7/31 rt vats for empyema with VDRF and hypotesion 8/3 Extubated  STUDIES:  CT Chest 7/29:  Loculated R pleural effusion with gas formation.    SUBJECTIVE:  Extubated successfully yesterday and is on room air. Remains confused and delirious.  VITAL SIGNS: Temp:  [97.3 F (36.3 C)-99.6 F (37.6 C)] 99.6 F (37.6 C) (08/03 1124) Pulse Rate:  [90-129] 90 (08/03 1100) Resp:  [14-32] 22 (08/03 1100) BP: (90-151)/(46-111) 151/101 mmHg (08/03 1100) SpO2:  [98 %-100 %] 100 % (08/03 1100) Weight:  [193 lb 9 oz (87.8 kg)] 193 lb 9 oz (87.8 kg) (08/03 0418)  PHYSICAL EXAMINATION: General:  Awake, responsive, confused.  HEENT:  PERRLA, moist mucus membranes Cardiovascular: Irregular rate. No MRG Lungs: Rt chest tubes to suction. 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 Skin: rt vats dressing intact   Recent Labs Lab 12/06/14 0458 12/07/14 0427 12/08/14 0507  NA 139 136 139  K 3.9 3.9 3.7  CL 109 106 110  CO2 25 22 22   BUN 11 16 12   CREATININE 1.02 1.19 1.02  GLUCOSE 139* 217* 129*    Recent Labs Lab 12/06/14 0458 12/07/14 0427 12/08/14 0507  HGB 9.9* 9.1* 8.3*  HCT 31.2* 28.7* 26.1*  WBC 25.8* 20.4* 11.0*  PLT 488* 404* 331   I reviewed X ray from today- Shows a persistent rt base opacity and lt atelectasis. Lines and tubes are in place.   ASSESSMENT / PLAN:  PULMONARY A: Rt empyema post VATS/Thora COPD/Empysema  Stable on room air.  Duonebs q6  hrs.  CARDIOVASCULAR A:  Hypotension Afib with RVR.  Off neo since yesterday. Amio drip for rapid afib. He is not on anticoagulation for Afib as an outpatient for unclear reasons. ? Fall risk Will need to clarify with family.  RENAL A:  No acute issue  GASTROINTESTINAL A:   GI protection P:   PPI Speech consult. Start PO diet if he clears swallow eval.   HEMATOLOGIC A:   No acute issue P:  Monitor for blood loss anemia  INFECTIOUS A:   Rt empyema. All cultures are negative so far.   BCx2 7/31>> UC 7/31 >> Sputum7/31>> Abx:  7/30 vanc>> 7/30 Pip-tazo>>  ENDOCRINE A:   DM   SSI  NEUROLOGIC A:  Intubated and sedated overnight  DVT prophylaxis with lovenox   FAMILY  - Updates: None at bedside.  CRITICAL CARE Performed by: Marshell Garfinkel  Total critical care time: 45  Critical care time was exclusive of separately billable procedures and treating other patients.  Critical care was necessary to treat or prevent imminent or life-threatening deterioration.  Critical care was time spent personally by me on the following activities: development of treatment plan with patient and/or surrogate as well as nursing, discussions with consultants, evaluation of patient's response to treatment, examination of patient, obtaining history from patient or surrogate, ordering and performing treatments and interventions, ordering and review of laboratory studies, ordering and review of radiographic studies, pulse oximetry and re-evaluation of patient's  condition.  Marshell Garfinkel, MD [place] P[facility]M Pager [phone] After 3pm or if no response, call [phone]

## 2014-12-08 NOTE — Progress Notes (Signed)
3 Days Post-Op Procedure(s) (LRB): VIDEO ASSISTED THORACOSCOPY (VATS)/EMPYEMA (N/A) VIDEO BRONCHOSCOPY (N/A) Subjective: confused  Objective: Vital signs in last 24 hours: Temp:  [97.3 F (36.3 C)-99.4 F (37.4 C)] 99.1 F (37.3 C) (08/03 0746) Pulse Rate:  [84-136] 118 (08/03 0710) Cardiac Rhythm:  [-] Atrial fibrillation (08/03 0100) Resp:  [10-32] 27 (08/03 0710) BP: (86-130)/(44-111) 111/82 mmHg (08/03 0710) SpO2:  [97 %-100 %] 100 % (08/03 0710) FiO2 (%):  [40 %] 40 % (08/02 0945) Weight:  [193 lb 9 oz (87.8 kg)] 193 lb 9 oz (87.8 kg) (08/03 0418)  Hemodynamic parameters for last 24 hours: CVP:  [9 mmHg-15 mmHg] 9 mmHg  Intake/Output from previous day: 08/02 0701 - 08/03 0700 In: 3794.2 [I.V.:3054.2; NG/GT:90; IV Piggyback:650] Out: 3845 [Urine:1370; Stool:1; Chest Tube:90] Intake/Output this shift:    General appearance: confused Neurologic: nonfocal Heart: irregularly irregular rhythm Lungs: clear anteriorly, decreased right base Abdomen: normal findings: soft, non-tender Extremities: edema 2+ no air leak  Lab Results:  Recent Labs  12/07/14 0427 12/08/14 0507  WBC 20.4* 11.0*  HGB 9.1* 8.3*  HCT 28.7* 26.1*  PLT 404* 331   BMET:  Recent Labs  12/07/14 0427 12/08/14 0507  NA 136 139  K 3.9 3.7  CL 106 110  CO2 22 22  GLUCOSE 217* 129*  BUN 16 12  CREATININE 1.19 1.02  CALCIUM 6.8* 7.1*    PT/INR: No results for input(s): LABPROT, INR in the last 72 hours. ABG    Component Value Date/Time   PHART 7.434 12/06/2014 0610   HCO3 23.4 12/06/2014 0610   TCO2 24.5 12/06/2014 0610   ACIDBASEDEF 0.3 12/06/2014 0610   O2SAT 94.5 12/06/2014 0610   CBG (last 3)   Recent Labs  12/07/14 1953 12/07/14 2328 12/08/14 0352  GLUCAP 106* 138* 107*    Assessment/Plan: S/P Procedure(s) (LRB): VIDEO ASSISTED THORACOSCOPY (VATS)/EMPYEMA (N/A) VIDEO BRONCHOSCOPY (N/A) -  CV- still in a fib but now rate controlled, on amiodarone, continue IV until  taking POs  RESP- aspiration pneumonia/pyema  Extubated yesterday  On Vanco and zosyn  IS as tolerated  Dc anterior chest tube  RENAL- creatinine OK  Nutrition- aspiration noted preop, will have speech reassess  severe protein calorie malnutrition, resume POs when able  CBG- improving  Anemia secondary to ABL, mild, follow  PT  Delirium   LOS: 6 days    Logan May 12/08/2014

## 2014-12-09 ENCOUNTER — Inpatient Hospital Stay (HOSPITAL_COMMUNITY): Payer: Medicare Other

## 2014-12-09 DIAGNOSIS — F10231 Alcohol dependence with withdrawal delirium: Secondary | ICD-10-CM

## 2014-12-09 LAB — GLUCOSE, CAPILLARY
GLUCOSE-CAPILLARY: 114 mg/dL — AB (ref 65–99)
GLUCOSE-CAPILLARY: 132 mg/dL — AB (ref 65–99)
GLUCOSE-CAPILLARY: 96 mg/dL (ref 65–99)
Glucose-Capillary: 112 mg/dL — ABNORMAL HIGH (ref 65–99)
Glucose-Capillary: 144 mg/dL — ABNORMAL HIGH (ref 65–99)
Glucose-Capillary: 109 mg/dL — ABNORMAL HIGH (ref 65–99)
Glucose-Capillary: 126 mg/dL — ABNORMAL HIGH (ref 65–99)

## 2014-12-09 LAB — CBC
HEMATOCRIT: 24.9 % — AB (ref 39.0–52.0)
HEMOGLOBIN: 8 g/dL — AB (ref 13.0–17.0)
MCH: 28.5 pg (ref 26.0–34.0)
MCHC: 32.1 g/dL (ref 30.0–36.0)
MCV: 88.6 fL (ref 78.0–100.0)
Platelets: 366 10*3/uL (ref 150–400)
RBC: 2.81 MIL/uL — AB (ref 4.22–5.81)
RDW: 15.2 % (ref 11.5–15.5)
WBC: 8.6 10*3/uL (ref 4.0–10.5)

## 2014-12-09 LAB — BASIC METABOLIC PANEL
Anion gap: 8 (ref 5–15)
BUN: 6 mg/dL (ref 6–20)
CALCIUM: 7.1 mg/dL — AB (ref 8.9–10.3)
CHLORIDE: 109 mmol/L (ref 101–111)
CO2: 22 mmol/L (ref 22–32)
Creatinine, Ser: 1.03 mg/dL (ref 0.61–1.24)
GFR calc non Af Amer: >60 mL/min (ref >60)
Glucose, Bld: 143 mg/dL — ABNORMAL HIGH (ref 65–99)
POTASSIUM: 3.3 mmol/L — AB (ref 3.5–5.1)
SODIUM: 139 mmol/L (ref 135–145)

## 2014-12-09 LAB — BODY FLUID CULTURE

## 2014-12-09 LAB — CULTURE, BODY FLUID-BOTTLE: CULTURE: NO GROWTH

## 2014-12-09 LAB — CULTURE, BODY FLUID W GRAM STAIN -BOTTLE

## 2014-12-09 MED ORDER — THIAMINE HCL 100 MG/ML IJ SOLN
100.0000 mg | Freq: Every day | INTRAMUSCULAR | Status: DC
  Administered 2014-12-09 – 2014-12-18 (×10): 100 mg via INTRAVENOUS
  Filled 2014-12-09 (×4): qty 2
  Filled 2014-12-09: qty 1
  Filled 2014-12-09: qty 2
  Filled 2014-12-09: qty 1
  Filled 2014-12-09 (×3): qty 2

## 2014-12-09 MED ORDER — VITAL HIGH PROTEIN PO LIQD: 1000.0000 mL | ORAL | Status: DC

## 2014-12-09 MED ORDER — POTASSIUM CHLORIDE 10 MEQ/50ML IV SOLN
10.0000 meq | Freq: Once | INTRAVENOUS | Status: AC
  Administered 2014-12-09: 10 meq via INTRAVENOUS
  Filled 2014-12-09: qty 50

## 2014-12-09 MED ORDER — LORAZEPAM 2 MG/ML IJ SOLN
1.0000 mg | INTRAMUSCULAR | Status: DC | PRN
  Administered 2014-12-11: 4 mg via INTRAVENOUS
  Filled 2014-12-09: qty 2

## 2014-12-09 MED ORDER — DEXMEDETOMIDINE HCL IN NACL 200 MCG/50ML IV SOLN
0.2000 ug/kg/h | INTRAVENOUS | Status: DC
  Administered 2014-12-09: 0.2 ug/kg/h via INTRAVENOUS
  Filled 2014-12-09 (×2): qty 50

## 2014-12-09 MED ORDER — FOLIC ACID 5 MG/ML IJ SOLN
1.0000 mg | Freq: Every day | INTRAMUSCULAR | Status: DC
  Administered 2014-12-09 – 2014-12-17 (×9): 1 mg via INTRAVENOUS
  Filled 2014-12-09 (×11): qty 0.2

## 2014-12-09 MED ORDER — VITAL AF 1.2 CAL PO LIQD
1000.0000 mL | ORAL | Status: DC
  Administered 2014-12-09 – 2014-12-12 (×4): 1000 mL
  Filled 2014-12-09 (×2): qty 1000
  Filled 2014-12-09: qty 237
  Filled 2014-12-09 (×10): qty 1000
  Filled 2014-12-09: qty 237
  Filled 2014-12-09 (×3): qty 1000

## 2014-12-09 NOTE — Evaluation (Signed)
Clinical/Bedside Swallow Evaluation Patient Details  Name: Logan May MRN: [id number] Date of Birth: 1946-04-26  Today's Date: 12/09/2014 Time: SLP Start Time (ACUTE ONLY): 2683 SLP Stop Time (ACUTE ONLY): 0852 SLP Time Calculation (min) (ACUTE ONLY): 16 min  Past Medical History:  Past Medical History  Diagnosis Date  . BPH (benign prostatic hypertrophy)   . Prostate cancer     implants   . Vitamin D deficiency     takes Vit D daily  . History of ETT 1999    Dr, Caleen Essex  . Anxiety     takes Valium daily as needed  . Hyperlipidemia     takes Simvastatin nightly  . Hypertension     takes Quinapril and Procardia daily  . Headache(784.0)     occasionally  . Peripheral neuropathy   . Diabetes mellitus, type 2     but doesn't take any meds for it;diet controlled and exercise  . 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 Pneumonia 1960's?  . CAP (community acquired pneumonia) 12/02/2014   Past Surgical History:  Past Surgical History  Procedure Laterality Date  . Foot surgery  May 2002    Dr. Sharol Given   . Left foot casted 4-5 months    . Bilateral  eye laser surgery Bilateral 2000    "related to diabetes"  . Charcot foot left Left 12/15/01  . Laser eye surgery right  12/2002  . Eye surgery    . Tonsillectomy    . I&d extremity Left 09/05/2012    Procedure: IRRIGATION AND DEBRIDEMENT EXTREMITY;  Surgeon: Newt Minion, MD;  Location: [place];  Service: Orthopedics;  Laterality: Left;  Excision Charcot Collapse Left Foot and Base 5th Metatarsal, Antibiotic Beads  . Esophagogastroduodenoscopy      at least 57yrs ago  . Amputation Left 05/20/2013    Procedure: AMPUTATION DIGIT;  Surgeon: Newt Minion, MD;  Location: [place];  Service: Orthopedics;  Laterality: Left;  Left Great Toe Amputation at MTP  . Video assisted thoracoscopy (vats)/empyema N/A 12/05/2014    Procedure: VIDEO ASSISTED THORACOSCOPY (VATS)/EMPYEMA;  Surgeon: Melrose Nakayama, MD;  Location: [place];  Service: Thoracic;   Laterality: N/A;  . Video bronchoscopy N/A 12/05/2014    Procedure: VIDEO BRONCHOSCOPY;  Surgeon: Melrose Nakayama, MD;  [facility];  Service: Thoracic;  Laterality: N/A;   HPI:  69 year old male admitted 7/28 with complaints of productive cough and SOB x 3 weeks. Admitted 7/28 for R sided CAP with associated effusion on CXR. PCCM consulted for evaluation of effusion. 7/31 rt vats for empyema with VDRF and hypotesion. History of ETOH, pt has been confused and agitated. MBS on 7/30 recommended Dys 2 diet with nectar via teaspoon with a chin tuck. After this assessment pt was intubated for procedure from 7/31 to 8/3.    Assessment / Plan / Recommendation Clinical Impression  Pt demonstrates worsened dysphagia since last assessment, prior to intubation, when he was recommended to consume nectar thick liquids via spoon with a chin tuck. Pt now is more confused, unable to follow compensatory strategies with max assist. Weakness has increased with delayed signs of aspriation with puree and ice chips. Aspiration risk is high and it is probable pt had a moderate dysphagia prior to admit. Mentation and strength will need to improve prior to PO intake. Recommend considerate of short term alternate nutrition with PO trials from SLP until pt demonstrates improvement.     Aspiration Risk  Severe  Diet Recommendation NPO;Alternative means - temporary   Medication Administration: Via alternative means    Other  Recommendations Oral Care Recommendations: Oral care QID   Follow Up Recommendations       Frequency and Duration        Pertinent Vitals/Pain NA    SLP Swallow Goals     Swallow Study Prior Functional Status       General Other Pertinent Information: 69 year old male admitted 7/28 with complaints of productive cough and SOB x 3 weeks. Admitted 7/28 for R sided CAP with associated effusion on CXR. PCCM consulted for evaluation of effusion. 7/31 rt vats for empyema with VDRF and  hypotesion. History of ETOH, pt has been confused and agitated. MBS on 7/30 recommended Dys 2 diet with nectar via teaspoon with a chin tuck. After this assessment pt was intubated for procedure from 7/31 to 8/3.  Type of Study: Bedside swallow evaluation Previous Swallow Assessment: see HPI Diet Prior to this Study: NPO Temperature Spikes Noted: No Respiratory Status: Room air History of Recent Intubation: Yes Length of Intubations (days): 4 days Date extubated: 12/08/14 Behavior/Cognition: Alert;Confused;Requires cueing Oral Cavity - Dentition: Poor condition;Missing dentition Self-Feeding Abilities: Total assist Patient Positioning: Upright in bed Baseline Vocal Quality: Low vocal intensity Volitional Cough: Weak Volitional Swallow:  (inconsistent)    Oral/Motor/Sensory Function Overall Oral Motor/Sensory Function: Appears within functional limits for tasks assessed   Ice Chips Ice chips: Impaired Presentation: Spoon Pharyngeal Phase Impairments: Decreased hyoid-laryngeal movement;Suspected delayed Swallow;Cough - Immediate;Wet Vocal Quality   Thin Liquid Thin Liquid: Not tested    Nectar Thick Nectar Thick Liquid: Not tested   Honey Thick Honey Thick Liquid: Not tested   Puree Puree: Impaired Presentation: Spoon Pharyngeal Phase Impairments: Cough - Immediate;Wet Vocal Quality;Multiple swallows;Decreased hyoid-laryngeal movement;Suspected delayed Swallow   Solid   GO    Solid: Not tested      Herbie [place], MA CCC-SLP [phone]  Milissa Fesperman, Katherene Ponto 12/09/2014,8:58 AM

## 2014-12-09 NOTE — Progress Notes (Addendum)
Nutrition Consult/Follow Up  DOCUMENTATION CODES:   Not applicable  INTERVENTION:   Re-start Vital AF 1.2 formula at 20 ml/hr and increase by 10 ml every 4 hours to goal rate of 70 ml/hr  TF regimen to provide 2016 kcals, 126 gm protein, 1362 ml of free water  NUTRITION DIAGNOSIS:   Inadequate oral intake now related to inability to eat as evidenced by NPO status, ongoing   GOAL:   Patient will meet greater than or equal to 90% of their needs, currently unmet  MONITOR:   Diet advancement, TF tolerance, Labs, Weight trends, I & O's  ASSESSMENT:   69 y.o. male Diabetes type 2 with Charcot foot supposedly diet-controlled, hypertension, generalized anxiety disorder, history of atrial fibrillation not on anticoagulation presumably secondary to falls. Patient presents to the hospital with 3 weeks of worsening productive cough with purulent sputum. Guaifenesin has been helpful, but has not resolved his cough. He also has been having increased weakness with some mild dyspnea on exertion which is improved with rest.    Patient s/p procedures 7/31: RIGHT VIDEO-ASSISTED THORACOSCOPY DRAINAGE RIGHT EMPYEMA VISCERAL & PARIETAL PLEURAL DECORTICATION  Patient extubated 8/2.  TF (Vital AF 1.2) discontinued via OGT.  S/p bedside swallow evaluation 8/4.  Pt at severe risk for aspiration.  SLP recommending NPO status at this time.  Panda feeding tube placement pending.  RD to re-start TF regimen.  Diet Order:  NPO  Skin:  Wound (see comment) (stage I pressure ulcer buttocks)  Last BM:  8/4  Height:   Ht Readings from Last 1 Encounters:  12/02/14 5\' 11"  (1.803 m)    Weight:   Wt Readings from Last 1 Encounters:  12/09/14 190 lb 0.6 oz (86.2 kg)    Ideal Body Weight:  78.2 kg  BMI:  Body mass index is 26.52 kg/(m^2).  Estimated Nutritional Needs:   Kcal:  2000-2200  Protein:  125-135 gm  Fluid:  per MD  EDUCATION NEEDS:   No education needs identified at this  time  Arthur Holms, RD, LDN Pager #: [phone] After-Hours Pager #: [phone]

## 2014-12-09 NOTE — Care Management Important Message (Signed)
Important Message  Patient Details  Name: Logan May MRN: [id number] Date of Birth: Feb 14, 1946   Medicare Important Message Given:  Yes-third notification given    Nathen May 12/09/2014, 11:58 AMImportant Message  Patient Details  Name: Logan May MRN: [id number] Date of Birth: [birth year]   Medicare Important Message Given:  Yes-third notification given    Nathen May 12/09/2014, 11:58 AM

## 2014-12-09 NOTE — Progress Notes (Signed)
PT Cancellation Note  Patient Details Name: CLETO CLAGGETT MRN: [id number] DOB: 04-03-1946   Cancelled Treatment:    Reason Eval/Treat Not Completed: Patient's level of consciousness. Per RN pt is currently sedated. PT attempted to arouse pt and he was unable to wake up. Will check back as schedule allows.   Rolinda Roan 12/09/2014, 11:21 AM   Rolinda Roan, PT, DPT Acute Rehabilitation Services Pager: [phone]

## 2014-12-09 NOTE — Progress Notes (Signed)
4 Days Post-Op Procedure(s) (LRB): VIDEO ASSISTED THORACOSCOPY (VATS)/EMPYEMA (N/A) VIDEO BRONCHOSCOPY (N/A) Subjective: Sedated currently Agitated and combative with staff overnight  Objective: Vital signs in last 24 hours: Temp:  [97.6 F (36.4 C)-99.6 F (37.6 C)] 98.8 F (37.1 C) (08/04 0700) Pulse Rate:  [86-113] 96 (08/04 0700) Cardiac Rhythm:  [-] Atrial fibrillation (08/04 0400) Resp:  [17-27] 21 (08/04 0700) BP: (86-181)/(41-105) 128/75 mmHg (08/04 0700) SpO2:  [93 %-100 %] 99 % (08/04 0700) Weight:  [190 lb 0.6 oz (86.2 kg)] 190 lb 0.6 oz (86.2 kg) (08/04 0500)  Hemodynamic parameters for last 24 hours: CVP:  [5 mmHg-8 mmHg] 7 mmHg  Intake/Output from previous day: 08/03 0701 - 08/04 0700 In: 3600.8 [I.V.:2600.8; IV Piggyback:1000] Out: 2501 [Urine:2450; Stool:1; Chest Tube:50] Intake/Output this shift:    General appearance: fatigued Heart: irregularly irregular rhythm Lungs: diminished breath sounds right base Abdomen: normal findings: soft, non-tender Wound: clean, CT with minimal output, no air leak  Lab Results:  Recent Labs  12/08/14 0507 12/09/14 0409  WBC 11.0* 8.6  HGB 8.3* 8.0*  HCT 26.1* 24.9*  PLT 331 366   BMET:  Recent Labs  12/08/14 0507 12/09/14 0409  NA 139 139  K 3.7 3.3*  CL 110 109  CO2 22 22  GLUCOSE 129* 143*  BUN 12 6  CREATININE 1.02 1.03  CALCIUM 7.1* 7.1*    PT/INR: No results for input(s): LABPROT, INR in the last 72 hours. ABG    Component Value Date/Time   PHART 7.434 12/06/2014 0610   HCO3 23.4 12/06/2014 0610   TCO2 24.5 12/06/2014 0610   ACIDBASEDEF 0.3 12/06/2014 0610   O2SAT 94.5 12/06/2014 0610   CBG (last 3)   Recent Labs  12/08/14 2348 12/09/14 0342 12/09/14 0735  GLUCAP 96 112* 132*    Assessment/Plan: S/P Procedure(s) (LRB): VIDEO ASSISTED THORACOSCOPY (VATS)/EMPYEMA (N/A) VIDEO BRONCHOSCOPY (N/A) -  POD # 4 VATS decortication CT drainage minimal, will dc diaphragmatic tube  today Keep posterior tube to water seal Vanco dc'ed, on zosyn for microaerophilic strep On enoxaparin for DVT prophylaxis, refuses SCD Still in a fib, rate controlled- he is poor candidate for long term anticoagulation He has severe aspiration and is not safe to take POs. He needs a panda for tube feeding for severe protein calorie malnutrition   LOS: 7 days    Melrose Nakayama 12/09/2014

## 2014-12-09 NOTE — Progress Notes (Signed)
Name: Logan May MRN: [id number] DOB: 08-09-1945    ADMISSION DATE:  12/02/2014 CONSULTATION DATE:  12/03/2014  REFERRING MD :  Dr. Grandville Silos [facility]  CHIEF COMPLAINT:  DOE  BRIEF PATIENT DESCRIPTION: 69 year old male admitted 7/28 with complaints of productive cough and SOB x 3 weeks. Admitted 7/28 for R sided CAP with associated effusion on CXR. PCCM consulted for evaluation of effusion.   SIGNIFICANT EVENTS  7/31 rt vats for empyema with VDRF and hypotesion  STUDIES:  CT Chest 7/29:  Loculated R pleural effusion with gas formation.    SUBJECTIVE:  Confused.  Agitated.  Spitting at staff.  Call staff names.  Wants hand full of his white pills.  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 Skin: rt vats dressing intact   CMP Latest Ref Rng 12/09/2014 12/08/2014 12/07/2014  Glucose 65 - 99 mg/dL 143(H) 129(H) 217(H)  BUN 6 - 20 mg/dL 6 12 16   Creatinine 0.61 - 1.24 mg/dL 1.03 1.02 1.19  Sodium 135 - 145 mmol/L 139 139 136  Potassium 3.5 - 5.1 mmol/L 3.3(L) 3.7 3.9  Chloride 101 - 111 mmol/L 109 110 106  CO2 22 - 32 mmol/L 22 22 22   Calcium 8.9 - 10.3 mg/dL 7.1(L) 7.1(L) 6.8(L)  Total Protein 6.5 - 8.1 g/dL - - 4.4(L)  Albumin 3.5 - 4.8 g/dL - - -  Total Bilirubin 0.3 - 1.2 mg/dL - - 0.6  Alkaline Phos 38 - 126 U/L - - 71  AST 15 - 41 U/L - - 32  ALT 17 - 63 U/L - - 19    CBC Latest Ref Rng 12/09/2014 12/08/2014 12/07/2014  WBC 4.0 - 10.5 K/uL 8.6 11.0(H) 20.4(H)  Hemoglobin 13.0 -  17.0 g/dL 8.0(L) 8.3(L) 9.1(L)  Hematocrit 39.0 - 52.0 % 24.9(L) 26.1(L) 28.7(L)  Platelets 150 - 400 K/uL 366 331 404(H)    Dg Chest Port 1 View  12/08/2014   CLINICAL DATA:  Empyema  EXAM: PORTABLE CHEST - 1 VIEW  COMPARISON:  12/07/2014  FINDINGS: Three chest tubes on the right are unchanged in position. No pneumothorax. Right lower lobe infiltrate. Minimal right effusion.  Left lower lobe atelectasis has progressed.  Negative for heart failure. Right jugular catheter tip in the SVC. Endotracheal tube and NG tubes removed.  IMPRESSION: Three chest tubes remain on the right without pneumothorax. Right lower lobe consolidation remains.  Increase in left lower lobe atelectasis.  Endotracheal tube removed in the interval.   Electronically Signed   By: Franchot Gallo M.D.   On: 12/08/2014 08:03   CBG (last 3)   Recent Labs  12/08/14 1919 12/08/14 2348 12/09/14 0342  GLUCAP 84 96 112*    ASSESSMENT / PLAN:  PULMONARY A: COPD with emphysema. P: Duoneb  CARDIOVASCULAR A:  Hypotension >> resolved, off pressors. Afib with RVR. P: Continue amiodarone Hold anticoagulation for now  RENAL A:  Hypokalemia. P: Replace electrolytes as needed.  GASTROINTESTINAL A:   Dysphagia. Nutrition. P:   F/u  with speech therapy  HEMATOLOGIC A:   Anemia of critical illness. P:  F/u CBC Lovenox of DVT prophylaxis  INFECTIOUS A:   Rt empyema s/p VATS. 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 P: Start precedex 8/04 PRN fentanyl, ativan   CC time 35 minutes  Chesley Mires, MD [place] 12/09/2014, 7:32 AM Pager:  [phone] After 3pm call: [phone]

## 2014-12-10 ENCOUNTER — Inpatient Hospital Stay (HOSPITAL_COMMUNITY): Payer: Medicare Other

## 2014-12-10 LAB — COMPREHENSIVE METABOLIC PANEL
ALBUMIN: 1.4 g/dL — AB (ref 3.5–5.0)
ALK PHOS: 73 U/L (ref 38–126)
ALT: 14 U/L — ABNORMAL LOW (ref 17–63)
ANION GAP: 8 (ref 5–15)
AST: 17 U/L (ref 15–41)
BUN: 6 mg/dL (ref 6–20)
CO2: 22 mmol/L (ref 22–32)
Calcium: 7.1 mg/dL — ABNORMAL LOW (ref 8.9–10.3)
Chloride: 111 mmol/L (ref 101–111)
Creatinine, Ser: 0.99 mg/dL (ref 0.61–1.24)
GLUCOSE: 174 mg/dL — AB (ref 65–99)
POTASSIUM: 3.6 mmol/L (ref 3.5–5.1)
Sodium: 141 mmol/L (ref 135–145)
Total Bilirubin: 0.2 mg/dL — ABNORMAL LOW (ref 0.3–1.2)
Total Protein: 4.7 g/dL — ABNORMAL LOW (ref 6.5–8.1)

## 2014-12-10 LAB — GLUCOSE, CAPILLARY
GLUCOSE-CAPILLARY: 137 mg/dL — AB (ref 65–99)
GLUCOSE-CAPILLARY: 153 mg/dL — AB (ref 65–99)
Glucose-Capillary: 144 mg/dL — ABNORMAL HIGH (ref 65–99)
Glucose-Capillary: 118 mg/dL — ABNORMAL HIGH (ref 65–99)
Glucose-Capillary: 166 mg/dL — ABNORMAL HIGH (ref 65–99)

## 2014-12-10 LAB — CBC
HEMATOCRIT: 25.2 % — AB (ref 39.0–52.0)
HEMOGLOBIN: 8 g/dL — AB (ref 13.0–17.0)
MCH: 28.4 pg (ref 26.0–34.0)
MCHC: 31.7 g/dL (ref 30.0–36.0)
MCV: 89.4 fL (ref 78.0–100.0)
PLATELETS: 353 10*3/uL (ref 150–400)
RBC: 2.82 MIL/uL — ABNORMAL LOW (ref 4.22–5.81)
RDW: 15.7 % — ABNORMAL HIGH (ref 11.5–15.5)
WBC: 7.9 10*3/uL (ref 4.0–10.5)

## 2014-12-10 LAB — MAGNESIUM
MAGNESIUM: 2.4 mg/dL (ref 1.7–2.4)
Magnesium: 1.3 mg/dL — ABNORMAL LOW (ref 1.7–2.4)

## 2014-12-10 LAB — PHOSPHORUS: Phosphorus: 2.8 mg/dL (ref 2.5–4.6)

## 2014-12-10 MED ORDER — METOPROLOL TARTRATE 25 MG PO TABS
25.0000 mg | ORAL_TABLET | Freq: Two times a day (BID) | ORAL | Status: DC
  Administered 2014-12-10 – 2014-12-11 (×2): 25 mg via ORAL
  Filled 2014-12-10 (×4): qty 1

## 2014-12-10 MED ORDER — METOPROLOL TARTRATE 1 MG/ML IV SOLN
5.0000 mg | Freq: Once | INTRAVENOUS | Status: AC
  Administered 2014-12-10: 5 mg via INTRAVENOUS
  Filled 2014-12-10: qty 5

## 2014-12-10 MED ORDER — PIPERACILLIN-TAZOBACTAM 3.375 G IVPB
3.3750 g | Freq: Three times a day (TID) | INTRAVENOUS | Status: DC
  Administered 2014-12-10 – 2014-12-12 (×5): 3.375 g via INTRAVENOUS
  Filled 2014-12-10 (×7): qty 50

## 2014-12-10 MED ORDER — SODIUM CHLORIDE 0.9 % IV SOLN
6.0000 g | Freq: Once | INTRAVENOUS | Status: AC
  Administered 2014-12-10: 6 g via INTRAVENOUS
  Filled 2014-12-10: qty 12

## 2014-12-10 MED ORDER — METOPROLOL TARTRATE 1 MG/ML IV SOLN: 5.0000 mg | INTRAVENOUS | Status: DC | PRN

## 2014-12-10 MED ORDER — GERHARDT'S BUTT CREAM
TOPICAL_CREAM | CUTANEOUS | Status: DC | PRN
  Administered 2014-12-16: 16:00:00 via TOPICAL
  Administered 2014-12-17: 1 via TOPICAL
  Filled 2014-12-10 (×2): qty 1

## 2014-12-10 NOTE — Progress Notes (Signed)
ANTIBIOTIC CONSULT NOTE - FOLLOW UP  Pharmacy Consult for Zosyn Indication: empyema/pneumonia  Allergies  Allergen Reactions  . Tetanus Toxoids Other (See Comments)    "Blacked out"    Patient Measurements: Height: 5\' 11"  (180.3 cm) Weight: 196 lb 10.4 oz (89.2 kg) IBW/kg (Calculated) : 75.3  Vital Signs: Temp: 98.2 F (36.8 C) (08/05 1100) Temp Source: Oral (08/05 0413) BP: 144/79 mmHg (08/05 1354) Pulse Rate: 96 (08/05 1354) Intake/Output from previous day: 08/04 0701 - 08/05 0700 In: 3570.9 [I.V.:2740.9; NG/GT:480; IV Piggyback:350] Out: 1401 [Urine:1300; Stool:1; Chest Tube:100] Intake/Output from this shift: Total I/O In: 994.5 [I.V.:554.5; NG/GT:290; IV Piggyback:150] Out: 260 [Urine:250; Chest Tube:10]  Labs:  Recent Labs  12/08/14 0507 12/09/14 0409 12/10/14 0416  WBC 11.0* 8.6 7.9  HGB 8.3* 8.0* 8.0*  PLT 331 366 353  CREATININE 1.02 1.03 0.99   Estimated Creatinine Clearance: 73.9 mL/min (by C-G formula based on Cr of 0.99). No results for input(s): VANCOTROUGH, VANCOPEAK, VANCORANDOM, GENTTROUGH, GENTPEAK, GENTRANDOM, TOBRATROUGH, TOBRAPEAK, TOBRARND, AMIKACINPEAK, AMIKACINTROU, AMIKACIN in the last 72 hours.  Assessment: 10 YOM on Day#7 Zosyn for empyema. S/p VATS drainage of empyema. Vancomycin stopped yesterday. Per CCM note, will continue zosyn for 14 days course. Afebrile, wbc wnl. Renal function stable, est. crcl ~ 70 ml/min.  Vanc 7/30>>8/4 Zosyn 7/30>> (8/12) Levaquin 7/29>>7/30 CTX x 1 7/28 Azthro x 1 7/28  7/28 Blood x 3 - neg 7/29 Urine - 30K/ml Enterococcus. MIC to Vanc < 0.5,       also sens to amp. Lvq, nitro and zyvox 7/29 Cdiff - NEG 7/30 Pleural fluid - gram stain showed GPC and GNR 7/30 pleura - ng x 4 days so far 7/31 bronchial washings - ng x 2 days so far 7/31 pleural tissue x 2 - Abundant Microaerophilic Strep, final  Plan:   Continue Zosyn 3.375 gm IV q8hrs (each over 4 hrs).  Entered stop date for 8/13  Pharmacy sign  off.   Maryanna Shape, PharmD, BCPS  Clinical Pharmacist  Pager: [phone]   12/10/2014,2:25 PM

## 2014-12-10 NOTE — Progress Notes (Signed)
Physical Therapy Treatment Patient Details Name: Logan May MRN: [id number] DOB: [birth year] Today's Date: 12/10/2014    History of Present Illness 69 y.o. male admitted to [facility] on 12/02/14 for cough, SOB, and weakness.  Dx with CAP, A-fib, weakness.  Pt with significant PMhx of anxiety, HTN, peripheral neuropathy, DM, diabetic charcot's foot, s/p surgery and wears shoes with inserts, and L toe amputation.  Pt now s/p thoracentesis and VATS, and ventillated.    PT Comments    Pt progressing towards physical therapy goals. RN requested that mobility limited to EOB as HR was increased >120 with transfers. Did not want pt up in chair at this time. Pt continues to require +2 assist for mobility and when medically ready to take steps, feel he will do fairly well with +2 assist to get to/from recliner. Will continue to follow.   Follow Up Recommendations  SNF;Supervision/Assistance - 24 hour     Equipment Recommendations  Rolling walker with 5" wheels    Recommendations for Other Services       Precautions / Restrictions Precautions Precautions: Fall Restrictions Weight Bearing Restrictions: No    Mobility  Bed Mobility Overal bed mobility: Needs Assistance;+2 for physical assistance Bed Mobility: Supine to Sit;Sit to Supine     Supine to sit: Mod assist;+2 for physical assistance Sit to supine: Max assist;+2 for physical assistance   General bed mobility comments: Pt requiired assist for all aspects of bed mobility. Assist to initiate and once legs were moved closer to EOB, was able to assist positioning his body for trunk elevation.   Transfers Overall transfer level: Needs assistance Equipment used: 2 person hand held assist Transfers: Sit to/from Stand Sit to Stand: Mod assist;+2 physical assistance         General transfer comment: +2 assist to support trunk and block knees. Pt stood ~20 seconds and HR increased to 132. Pt was not transferred to chair at this point  per RN due to elevated HR. Pt was returned to bed for linen change and peri-care as he had soiled bed.   Ambulation/Gait             General Gait Details: Not able due to elevated HR.    Stairs            Wheelchair Mobility    Modified Rankin (Stroke Patients Only)       Balance Overall balance assessment: Needs assistance Sitting-balance support: Feet supported;Bilateral upper extremity supported Sitting balance-Leahy Scale: Poor Sitting balance - Comments: Requires UE support and occasional assist to maintain seated balance.    Standing balance support: Bilateral upper extremity supported;During functional activity Standing balance-Leahy Scale: Zero Standing balance comment: +2 assist required.                    Cognition Arousal/Alertness: Awake/alert Behavior During Therapy: WFL for tasks assessed/performed Overall Cognitive Status: Impaired/Different from baseline Area of Impairment: Orientation;Memory;Safety/judgement;Awareness;Following commands;Problem solving   Current Attention Level: Focused Memory: Decreased short-term memory Following Commands: Follows one step commands with increased time;Follows one step commands consistently Safety/Judgement: Decreased awareness of safety;Decreased awareness of deficits Awareness: Intellectual Problem Solving: Difficulty sequencing;Requires verbal cues;Requires tactile cues      Exercises      General Comments        Pertinent Vitals/Pain Pain Assessment: No/denies pain    Home Living                      Prior Function  PT Goals (current goals can now be found in the care plan section) Acute Rehab PT Goals Patient Stated Goal: to go home PT Goal Formulation: With patient/family Time For Goal Achievement: 12/17/14 Potential to Achieve Goals: Good Progress towards PT goals: Progressing toward goals    Frequency  Min 2X/week    PT Plan Current plan remains  appropriate    Co-evaluation             End of Session Equipment Utilized During Treatment: Gait belt Activity Tolerance: Treatment limited secondary to medical complications (Comment) (Elevated HR) Patient left: in bed;with call bell/phone within reach;with nursing/sitter in room     Time: 1108-1130 PT Time Calculation (min) (ACUTE ONLY): 22 min  Charges:  $Therapeutic Activity: 8-22 mins                    G Codes:      Rolinda Roan 12-17-14, 1:34 PM   Rolinda Roan, PT, DPT Acute Rehabilitation Services Pager: [phone]

## 2014-12-10 NOTE — Progress Notes (Signed)
[place] ICU Electrolyte Replacement Protocol  Patient Name: Logan May DOB: 06-Jun-1945 MRN: [id number]  Date of Service  12/10/2014   HPI/Events of Note    Recent Labs Lab 12/04/14 0840  12/05/14 1024  12/06/14 0458 12/07/14 0427 12/08/14 0507 12/09/14 0409 12/10/14 0416  NA  --   < >  --   < > 139 136 139 139 141  K  --   < >  --   < > 3.9 3.9 3.7 3.3* 3.6  CL  --   < >  --   --  109 106 110 109 111  CO2  --   < >  --   --  $R'25 22 22 22 22  'nF$ GLUCOSE  --   < >  --   --  139* 217* 129* 143* 174*  BUN  --   < >  --   --  $R'11 16 12 6 6  'yD$ CREATININE  --   < >  --   --  1.02 1.19 1.02 1.03 0.99  CALCIUM  --   < >  --   --  7.5* 6.8* 7.1* 7.1* 7.1*  MG 1.4*  --  1.5*  --  2.1  --   --   --  1.3*  PHOS  --   --   --   --   --   --   --   --  2.8  < > = values in this interval not displayed.  Estimated Creatinine Clearance: 73.9 mL/min (by C-G formula based on Cr of 0.99).  Intake/Output      08/04 0701 - 08/05 0700   I.V. (mL/kg) 2622 (29.4)   NG/GT 430   IV Piggyback 300   Total Intake(mL/kg) 3352 (37.6)   Urine (mL/kg/hr) 1300 (0.6)   Stool 1 (0)   Chest Tube 100 (0)   Total Output 1401   Net +1951       Stool Occurrence 2 x    - I/O DETAILED x24h    Total I/O In: 1883.2 [I.V.:1303.2; NG/GT:430; IV Piggyback:150] Out: 450 [Urine:400; Chest Tube:50] - I/O THIS SHIFT    ASSESSMENT   eICURN Interventions   ICU Electrolyte Replacement Protocol criteria met. Labs Replaced per protocol. MD notified   ASSESSMENT: MAJOR ELECTROLYTE    Lorene Dy 12/10/2014, 6:43 AM

## 2014-12-10 NOTE — Progress Notes (Addendum)
Name: Logan May MRN: [id number] DOB: 10/17/1945    ADMISSION DATE:  12/02/2014 CONSULTATION DATE:  12/03/2014  REFERRING MD :  Dr. Grandville Silos [facility]  CHIEF COMPLAINT:  DOE  BRIEF PATIENT DESCRIPTION: 69 year old male admitted 7/28 with complaints of productive cough and SOB x 3 weeks. Admitted 7/28 for R sided CAP with associated effusion on CXR. PCCM consulted for evaluation of effusion.   SIGNIFICANT EVENTS  7/31 rt vats for empyema with VDRF and hypotesion  STUDIES:  CT Chest 7/29:  Loculated R pleural effusion with gas formation.    SUBJECTIVE:  Started on precedex yesterday with improvement in mental status. Chest tube removed. NPO as he failed swallow eval.  VITAL SIGNS: Temp:  [97.4 F (36.3 C)-98.5 F (36.9 C)] 97.9 F (36.6 C) (08/05 0700) Pulse Rate:  [69-103] 82 (08/05 0600) Resp:  [16-33] 33 (08/05 0600) BP: (75-151)/(45-79) 100/52 mmHg (08/05 0600) SpO2:  [87 %-100 %] 100 % (08/05 0600) Weight:  [196 lb 10.4 oz (89.2 kg)] 196 lb 10.4 oz (89.2 kg) (08/05 0500)  PHYSICAL EXAMINATION: General: Calm resposive HEENT: Pupils reactive Cardiovascular: Irregular Lungs: Clear antr. Abdomen:  Soft, non-tender Musculoskeletal:  Lower ext with amputated toes and charcot feet.   CMP Latest Ref Rng 12/10/2014 12/09/2014 12/08/2014  Glucose 65 - 99 mg/dL 174(H) 143(H) 129(H)  BUN 6 - 20 mg/dL 6 6 12   Creatinine 0.61 - 1.24 mg/dL 0.99 1.03 1.02  Sodium 135 - 145 mmol/L 141 139 139  Potassium 3.5 - 5.1 mmol/L 3.6 3.3(L) 3.7  Chloride 101 - 111 mmol/L 111 109 110  CO2 22 - 32 mmol/L 22 22 22   Calcium 8.9 - 10.3 mg/dL 7.1(L) 7.1(L) 7.1(L)  Total Protein 6.5 - 8.1 g/dL 4.7(L) - -  Albumin 3.5 - 4.8 g/dL - - -  Total Bilirubin 0.3 - 1.2 mg/dL 0.2(L) - -  Alkaline Phos 38 - 126 U/L 73 - -  AST 15 - 41 U/L 17 - -  ALT 17 - 63 U/L 14(L) - -    CBC Latest Ref Rng 12/10/2014 12/09/2014 12/08/2014  WBC 4.0 - 10.5 K/uL 7.9 8.6 11.0(H)  Hemoglobin 13.0 - 17.0 g/dL 8.0(L) 8.0(L)  8.3(L)  Hematocrit 39.0 - 52.0 % 25.2(L) 24.9(L) 26.1(L)  Platelets 150 - 400 K/uL 353 366 331    Dg Chest Port 1 View  12/10/2014   CLINICAL DATA:  69 year old male status post VATS for empyema. Initial encounter.  EXAM: PORTABLE CHEST - 1 VIEW  COMPARISON:  12/09/2014 and earlier.  FINDINGS: Portable AP semi upright view at 0710 hrs. Enteric feeding tube is been placed, tip not included. 1 of the 2 right chest tubes has been removed. Single residual right side chest tube and right IJ central line appear stable. Stable lung volumes. Stable cardiac size and mediastinal contours. Residual peripheral and basilar opacity in the right lung is stable. No pneumothorax. Left lower lobe atelectasis.  IMPRESSION: 1. One right chest tube removed, 1 remains. No pneumothorax identified. 2. Feeding tube placed, tip not included. 3. Stable right lung ventilation. Left lung remarkable for lower lobe atelectasis.   Electronically Signed   By: Genevie Ann M.D.   On: 12/10/2014 07:49   Dg Chest Port 1 View  12/09/2014   CLINICAL DATA:  Post VATS, empyema, history prostate cancer, hypertension, diabetes mellitus  EXAM: PORTABLE CHEST - 1 VIEW  COMPARISON:  Portable exam 0647 hours compared 12/08/2014  FINDINGS: RIGHT jugular central venous catheter with tip projecting over SVC.  Pair of RIGHT thoracostomy tubes unchanged.  Mild enlargement of cardiac silhouette.  Stable mediastinal contours and pulmonary vascularity.  Mild residual pleural-based opacity in the RIGHT hemi thorax with persisting consolidation in RIGHT lower lobe.  LEFT lung clear.  No pneumothorax.  IMPRESSION: Stable RIGHT thoracostomy tubes with persistent RIGHT lower lobe consolidation and mild residual pleural-based opacity.   Electronically Signed   By: Lavonia Dana M.D.   On: 12/09/2014 07:55   Dg Abd Portable 1v  12/09/2014   CLINICAL DATA:  Feeding tube placement .  EXAM: PORTABLE ABDOMEN - 1 VIEW  COMPARISON:  None.  FINDINGS: Feeding tube noted with its  tip projected over the descending portion of the duodenum. No gastric distention. Degenerative changes lumbar spine. Vascular calcification.  IMPRESSION: Feeding tube noted with tip projected over the descending portion of the duodenum. No gastric distention noted.   Electronically Signed   By: Marcello Moores  Register   On: 12/09/2014 13:09   CBG (last 3)   Recent Labs  12/09/14 1918 12/09/14 2327 12/10/14 0349  GLUCAP 109* 126* 137*    ASSESSMENT / PLAN:  PULMONARY A: COPD with emphysema. P: Duoneb  CARDIOVASCULAR A:  Hypotension >> resolved, off pressors. Afib with RVR. P: Will D/C amiodarone as HR is well controlled.  Will use lopressor PRN  After he is able to eat will resume his outpatient [facility] meds. Hold anticoagulation for now  RENAL A:  Hypokalemia. P: Replace electrolytes as needed.  GASTROINTESTINAL A:   Dysphagia. Nutrition. P:   F/u with speech therapy NPO for now.  HEMATOLOGIC A:   Anemia of critical illness. P:  F/u CBC Lovenox for DVT prophylaxis  INFECTIOUS A:   Rt empyema s/p VATS. 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 Will plan for 14 day course of abx  Urine 7/29 >> Enterococcus. 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 P: On precedex. Will try to wean off today. CIWA protocol for alcohol withdrawal. PRN fentanyl for pain.  Addendum- Lt UE edema noted. Will order doppler to assess for clot.  CC time 40 minutes  Marshell Garfinkel MD [facility] Pager [phone] If no answer or after 3pm call: [phone] 12/10/2014, 8:49 AM

## 2014-12-10 NOTE — Progress Notes (Signed)
Speech Language Pathology Treatment: Dysphagia  Patient Details Name: Logan May MRN: [id number] DOB: 1945/12/29 Today's Date: 12/10/2014 Time: 8403-7543 SLP Time Calculation (min) (ACUTE ONLY): 15 min  Assessment / Plan / Recommendation Clinical Impression  Pt demonstrates improvement in arousal and participation with therapy, though swallow function continues to be severely impaired. Pt is struggling to clear standing oropharyngeal secretions prior to assessment. SLP offered cues to clear secretions with application of suction to base of tongue with positive gag response. Trials of puree and nectar tsp provided with max assist for a chin tuck, multiple effortful swallows. Still with severely wet vocal quality and delayed cough indicative of aspiration and inability to tolerate any PO intake. Recommend pt remain NPO through the weekend. Will reassess on Monday, with MBS if possible. Results will help guide plan of care.    HPI Other Pertinent Information: 69 year old male admitted 7/28 with complaints of productive cough and SOB x 3 weeks. Admitted 7/28 for R sided CAP with associated effusion on CXR. PCCM consulted for evaluation of effusion. 7/31 rt vats for empyema with VDRF and hypotesion. History of ETOH, pt has been confused and agitated. MBS on 7/30 recommended Dys 2 diet with nectar via teaspoon with a chin tuck. After this assessment pt was intubated for procedure from 7/31 to 8/3.    Pertinent Vitals Pain Assessment: No/denies pain  SLP Plan  Continue with current plan of care    Recommendations Diet recommendations: NPO              Oral Care Recommendations: Oral care QID Plan: Continue with current plan of care    GO    [facility], MA CCC-SLP [phone]  Logan May 12/10/2014, 1:37 PM

## 2014-12-10 NOTE — Progress Notes (Signed)
5 Days Post-Op Procedure(s) (LRB): VIDEO ASSISTED THORACOSCOPY (VATS)/EMPYEMA (N/A) VIDEO BRONCHOSCOPY (N/A) Subjective: Feels a little better today Still confused  Objective: Vital signs in last 24 hours: Temp:  [97.4 F (36.3 C)-98.5 F (36.9 C)] 97.9 F (36.6 C) (08/05 0700) Pulse Rate:  [69-103] 82 (08/05 0600) Cardiac Rhythm:  [-] Atrial fibrillation (08/05 0400) Resp:  [16-33] 33 (08/05 0600) BP: (75-151)/(45-79) 100/52 mmHg (08/05 0600) SpO2:  [87 %-100 %] 100 % (08/05 0600) Weight:  [196 lb 10.4 oz (89.2 kg)] 196 lb 10.4 oz (89.2 kg) (08/05 0500)  Hemodynamic parameters for last 24 hours:    Intake/Output from previous day: 08/04 0701 - 08/05 0700 In: 3352 [I.V.:2622; NG/GT:430; IV Piggyback:300] Out: 1401 [Urine:1300; Stool:1; Chest Tube:100] Intake/Output this shift:    General appearance: more alert Neurologic: nonfocal Heart: irregularly irregular rhythm Lungs: diminished breath sounds bibasilar Abdomen: soft nontender no air leak, minimal output form CT  Lab Results:  Recent Labs  12/09/14 0409 12/10/14 0416  WBC 8.6 7.9  HGB 8.0* 8.0*  HCT 24.9* 25.2*  PLT 366 353   BMET:  Recent Labs  12/09/14 0409 12/10/14 0416  NA 139 141  K 3.3* 3.6  CL 109 111  CO2 22 22  GLUCOSE 143* 174*  BUN 6 6  CREATININE 1.03 0.99  CALCIUM 7.1* 7.1*    PT/INR: No results for input(s): LABPROT, INR in the last 72 hours. ABG    Component Value Date/Time   PHART 7.434 12/06/2014 0610   HCO3 23.4 12/06/2014 0610   TCO2 24.5 12/06/2014 0610   ACIDBASEDEF 0.3 12/06/2014 0610   O2SAT 94.5 12/06/2014 0610   CBG (last 3)   Recent Labs  12/09/14 1918 12/09/14 2327 12/10/14 0349  GLUCAP 109* 126* 137*    Assessment/Plan: S/P Procedure(s) (LRB): VIDEO ASSISTED THORACOSCOPY (VATS)/EMPYEMA (N/A) VIDEO BRONCHOSCOPY (N/A) -  Chest xray looks good, no drainage from CT and no air leak- will dc remaining chest tube   Continue zosyn for CAP/  empyema  Aspiration- NPO with feeding tube in place, speech therapy  Severe protein calorie malnutrition  Stable from a surgical standpoint but several medical issues that require ongoing care   LOS: 8 days    Melrose Nakayama 12/10/2014

## 2014-12-11 ENCOUNTER — Inpatient Hospital Stay (HOSPITAL_COMMUNITY): Payer: Medicare Other

## 2014-12-11 DIAGNOSIS — E119 Type 2 diabetes mellitus without complications: Secondary | ICD-10-CM

## 2014-12-11 LAB — CBC
HEMATOCRIT: 26.5 % — AB (ref 39.0–52.0)
Hemoglobin: 8.4 g/dL — ABNORMAL LOW (ref 13.0–17.0)
MCH: 28.3 pg (ref 26.0–34.0)
MCHC: 31.7 g/dL (ref 30.0–36.0)
MCV: 89.2 fL (ref 78.0–100.0)
Platelets: 429 10*3/uL — ABNORMAL HIGH (ref 150–400)
RBC: 2.97 MIL/uL — AB (ref 4.22–5.81)
RDW: 15.7 % — ABNORMAL HIGH (ref 11.5–15.5)
WBC: 9.9 10*3/uL (ref 4.0–10.5)

## 2014-12-11 LAB — BASIC METABOLIC PANEL
Anion gap: 5 (ref 5–15)
BUN: 8 mg/dL (ref 6–20)
CHLORIDE: 115 mmol/L — AB (ref 101–111)
CO2: 23 mmol/L (ref 22–32)
Calcium: 7 mg/dL — ABNORMAL LOW (ref 8.9–10.3)
Creatinine, Ser: 0.98 mg/dL (ref 0.61–1.24)
GFR calc Af Amer: >60 mL/min (ref >60)
GFR calc non Af Amer: >60 mL/min (ref >60)
Glucose, Bld: 167 mg/dL — ABNORMAL HIGH (ref 65–99)
Potassium: 3.7 mmol/L (ref 3.5–5.1)
SODIUM: 143 mmol/L (ref 135–145)

## 2014-12-11 LAB — ANAEROBIC CULTURE

## 2014-12-11 LAB — GLUCOSE, CAPILLARY
GLUCOSE-CAPILLARY: 132 mg/dL — AB (ref 65–99)
GLUCOSE-CAPILLARY: 168 mg/dL — AB (ref 65–99)
GLUCOSE-CAPILLARY: 98 mg/dL (ref 65–99)
Glucose-Capillary: 155 mg/dL — ABNORMAL HIGH (ref 65–99)
Glucose-Capillary: 168 mg/dL — ABNORMAL HIGH (ref 65–99)
Glucose-Capillary: 187 mg/dL — ABNORMAL HIGH (ref 65–99)

## 2014-12-11 MED ORDER — ALBUTEROL SULFATE (2.5 MG/3ML) 0.083% IN NEBU: 2.5000 mg | INHALATION_SOLUTION | RESPIRATORY_TRACT | Status: DC | PRN

## 2014-12-11 MED ORDER — ACETAMINOPHEN 325 MG PO TABS: 650.0000 mg | ORAL_TABLET | Freq: Four times a day (QID) | ORAL | Status: DC | PRN

## 2014-12-11 MED ORDER — ASPIRIN EC 81 MG PO TBEC
81.0000 mg | DELAYED_RELEASE_TABLET | Freq: Every day | ORAL | Status: DC
  Administered 2014-12-11 – 2014-12-13 (×3): 81 mg via ORAL
  Filled 2014-12-11 (×3): qty 1

## 2014-12-11 MED ORDER — SODIUM CHLORIDE 0.9 % IJ SOLN
10.0000 mL | INTRAMUSCULAR | Status: DC | PRN
  Administered 2014-12-11: 20 mL
  Administered 2014-12-11 – 2014-12-16 (×8): 10 mL
  Filled 2014-12-11 (×8): qty 40

## 2014-12-11 MED ORDER — METOPROLOL TARTRATE 1 MG/ML IV SOLN
5.0000 mg | INTRAVENOUS | Status: DC | PRN
  Administered 2014-12-16 – 2014-12-18 (×2): 5 mg via INTRAVENOUS
  Filled 2014-12-11 (×3): qty 5

## 2014-12-11 MED ORDER — METOPROLOL TARTRATE 25 MG PO TABS
25.0000 mg | ORAL_TABLET | Freq: Two times a day (BID) | ORAL | Status: DC
  Administered 2014-12-11 – 2014-12-17 (×5): 25 mg via ORAL
  Filled 2014-12-11 (×5): qty 1

## 2014-12-11 NOTE — Progress Notes (Signed)
NURSING PROGRESS NOTE  Logan May [id number] Admission Data: 12/11/2014 3:44 AM Attending Provider: Juanito Doom, MD JSH:[id number], Elenore Rota, MD Code Status: Full   Logan May is a 68 y.o. male that transferred from the ICU, to Bed 28 on [address].  Cardiac Monitoring: Box # 8 in place. Cardiac monitor yields:Atrial Fibrillation , HR 113.  Vital 1.2 infusing at 70 ml per hour via NGT to the right nare.  IV Fluids:  NS infusing with 20 KCl infusing at 100 ml per hour via right IJ.

## 2014-12-11 NOTE — Progress Notes (Signed)
Received orders that NG-tube placement was verified, by abdominal xray. , By M.Lynch,  N . P. Vital 1.2 tube feeding restarted at 70 ml per hour.

## 2014-12-11 NOTE — Progress Notes (Signed)
Radiology arrived and performed abdominal xray.

## 2014-12-11 NOTE — Progress Notes (Signed)
Notified M. Lynch, N.P. in reference to pt's [place] tube coming out 2 cm. Tube feeding placed on hold. Reinserted ng-tube back 2 cm via right nare. Chest xray ordered to confirm placement.

## 2014-12-11 NOTE — Progress Notes (Addendum)
PATIENT DETAILS Name: Logan May Age: 69 y.o. 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 PCCM was consulted, underwent thoracocentesis on 7/30-pleural fluid was consistent with an empyema. Subsequently cardiothoracic surgery was consulted, and a right sided VATS, drainage of empyema and visceral/pleural decortication was done on 7/31. Patient subsequently developed respiratory failure and required mechanical ventilation till 8/3. ICU course was complicated by development of atrial fibrillation with RVR requiring amiodarone infusion and severe delirium thought to be secondary to alcohol withdrawal. Upon clinical stability, patient was transferred to the hospitalist service on 8/6.  Subjective: Confused-but calm. Follows most of my commands.  Assessment/Plan: Principal Problem: Severe sepsis:secondary to right sided PNA and empyema. Sepsis pathophysiology has resolved following VATS/decortication. Blood culture neg, but tissue culture positive for microaerophilic strep-on Zosyn.  Active Problems: Right sided Empyema: underwent VATS, drainage of empyema and visceral/pleural decortication on 7/31.Tissue culture 7/31 positive for Microaerophilic streptococci,  AFB culture/fungal culture negative so far.Continue Zosyn-but suspect we could narrow to Unasyn over the next few days-Abx stop date 8/13  Acute Respiratory Failure: developed VDRF on 7/31 post VATS/decortication, successfully extubated on 8/2.  Acute Urinary Retention:underwent  Flexible cytoscopy and passage of foley catheter by Dr Gaynelle Arabian on 7/31  Afib RSW:NIOEVOJJ Amiodarone gtt for rate control, now on prn lopressor. Felt to be a poor candidate for long term anticoagulation-although has a CHADS2Vasc SCORE of  3. Start ASA  ETOH withdrawal:required Precedex gtt while in [place] less agitated-still somewhat confused-continue Ativan per CIWA protocol  Severe Dysphagia:PANDA in place-has hx of dysphagia prior to admit-now likely worsened by acute illness. Keep NPO, continue PANDA feeds, await further SLP follow up  Enterococcal UTI: Seen on urine culture 7/29-this is pansensitive. On Zosyn.  Left upper Ext swelling:await Doppler  Anemia:likely secondary to  critical illness. Follow CBC and transfuse prn  Type 2 DM: CBG's stable with SSI. A1C 6.8 on 7/29  Hx of COPD with emphysema:continue bronchodilators-lungs clear  Dyslipidemia:continue Statin  Severe Deconditioning:PT on board-SNF on discharge  Disposition: Remain inpatient-SNF on discharge  Antimicrobial agents  See below  Anti-infectives    Start     Dose/Rate Route Frequency Ordered Stop   12/10/14 2100  piperacillin-tazobactam (ZOSYN) IVPB 3.375 g     3.375 g 12.5 mL/hr over 240 Minutes Intravenous Every 8 hours 12/10/14 1432 12/18/14 0459   12/05/14 0900  vancomycin (VANCOCIN) IVPB 1000 mg/200 mL premix  Status:  Discontinued     1,000 mg 200 mL/hr over 60 Minutes Intravenous Every 12 hours 12/04/14 2003 12/09/14 0736   12/05/14 0600  vancomycin (VANCOCIN) IVPB 1000 mg/200 mL premix  Status:  Discontinued     1,000 mg 200 mL/hr over 60 Minutes Intravenous To Surgery 12/04/14 1604 12/04/14 2000   12/04/14 2100  piperacillin-tazobactam (ZOSYN) IVPB 3.375 g  Status:  Discontinued     3.375 g 12.5 mL/hr over 240 Minutes Intravenous Every 8 hours 12/04/14 2003 12/10/14 1432   12/04/14 2100  vancomycin (VANCOCIN) 1,500 mg in sodium chloride 0.9 % 500 mL IVPB  1,500 mg 250 mL/hr over  120 Minutes Intravenous  Once 12/04/14 2003 12/04/14 2348   12/03/14 1330  levofloxacin (LEVAQUIN) IVPB 750 mg  Status:  Discontinued     750 mg 100 mL/hr over 90 Minutes Intravenous Every 24 hours 12/02/14 1514 12/04/14 1951   12/02/14 1130  cefTRIAXone (ROCEPHIN) 1 g in dextrose 5 % 50 mL IVPB     1 g 100 mL/hr over 30 Minutes Intravenous  Once 12/02/14 1118 12/02/14 1246   12/02/14 1130  azithromycin (ZITHROMAX) 500 mg in dextrose 5 % 250 mL IVPB     500 mg 250 mL/hr over 60 Minutes Intravenous  Once 12/02/14 1118 12/02/14 1429      DVT Prophylaxis: Prophylactic Lovenox   Code Status: Full code   Family Communication None at bedside  Procedures: 7/30>>Thoracocentesis 7/31>>VATS, drainage of empyema and visceral/pleural decortication(Dr Roxan Hockey) 7/31>> Flexible cytoscopy and passage of foley catheter (Dr Gaynelle Arabian) 7/31>>8/3 ETT  CONSULTS:  pulmonary/intensive care, urology and CTVS  Time spent 40 minutes-Greater than 50% of this time was spent in counseling, explanation of diagnosis, planning of further management, and coordination of care.  MEDICATIONS: Scheduled Meds: . atorvastatin  20 mg Oral q1800  . bisacodyl  10 mg Oral Daily  . cholecalciferol  2,000 Units Oral Once per day on Mon Tue Wed Thu Fri  . cholecalciferol  4,000 Units Oral Once per day on Sun Sat  . enoxaparin (LOVENOX) injection  40 mg Subcutaneous Q24H  . folic acid  1 mg Intravenous Daily  . insulin aspart  0-15 Units Subcutaneous 6 times per day  . ipratropium-albuterol  3 mL Nebulization Q6H  . metoprolol tartrate  25 mg Oral BID  . piperacillin-tazobactam (ZOSYN)  IV  3.375 g Intravenous Q8H  . senna-docusate  1 tablet Oral QHS  . thiamine IV  100 mg Intravenous Daily   Continuous Infusions: . 0.9 % NaCl with KCl 20 mEq / L 100 mL/hr at 12/11/14 1332  . feeding supplement (VITAL AF 1.2 CAL) 1,000 mL (12/11/14 1052)   PRN Meds:.fentaNYL (SUBLIMAZE) injection, Gerhardt's butt cream,  LORazepam, metoprolol, ondansetron (ZOFRAN) IV, potassium chloride, sodium chloride    PHYSICAL EXAM: Vital signs in last 24 hours: Filed Vitals:   12/11/14 0528 12/11/14 0934 12/11/14 1037 12/11/14 1354  BP: 153/63  147/67   Pulse: 135  90   Temp: 98.2 F (36.8 C)  97.3 F (36.3 C)   TempSrc: Oral  Oral   Resp: 20     Height:      Weight:      SpO2:  92% 91% 92%    Weight change: -2.7 kg (-5 lb 15.2 oz) Filed Weights   12/09/14 0500 12/10/14 0500 12/11/14 0500  Weight: 86.2 kg (190 lb 0.6 oz) 89.2 kg (196 lb 10.4 oz) 86.5 kg (190 lb 11.2 oz)   Body mass index is 26.61 kg/(m^2).   Gen Exam: Awake but confused-however follows some commands. Panda tube in place Neck: Supple, No JVD.  Chest: B/L Clear anteriorly CVS: S1 S2 Regular, no murmurs.  Abdomen: soft, BS +, non tender Extremities: no edema, lower extremities warm to touch. Neurologic: Non Focal-but with gen weakness  Intake/Output from previous day:  Intake/Output Summary (Last 24 hours) at 12/11/14 1530 Last data filed at 12/11/14 1352  Gross per 24 hour  Intake    510 ml  Output   2750 ml  Net  -2240 ml     LAB RESULTS: CBC  Recent Labs Lab 12/05/14 0517  12/07/14 0427 12/08/14 0507 12/09/14 0409 12/10/14 0416 12/11/14 0515  WBC 14.0*  < > 20.4* 11.0* 8.6 7.9 9.9  HGB 9.9*  < > 9.1* 8.3* 8.0* 8.0* 8.4*  HCT 31.1*  < > 28.7* 26.1* 24.9* 25.2* 26.5*  PLT 403*  < > 404* 331 366 353 429*  MCV 89.9  < > 90.3 89.4 88.6 89.4 89.2  MCH 28.6  < > 28.6 28.4 28.5 28.4 28.3  MCHC 31.8  < > 31.7 31.8 32.1 31.7 31.7  RDW 14.7  < > 14.9 15.0 15.2 15.7* 15.7*  LYMPHSABS 1.1  --   --   --   --   --   --   MONOABS 0.7  --   --   --   --   --   --   EOSABS 0.0  --   --   --   --   --   --   BASOSABS 0.0  --   --   --   --   --   --   < > = values in this interval not displayed.  Chemistries   Recent Labs Lab 12/05/14 1024  12/06/14 0458 12/07/14 0427 12/08/14 0507 12/09/14 0409 12/10/14 0416  12/10/14 2225 12/11/14 0515  NA  --   < > 139 136 139 139 141  --  143  K  --   < > 3.9 3.9 3.7 3.3* 3.6  --  3.7  CL  --   --  109 106 110 109 111  --  115*  CO2  --   --  25 22 22 22 22   --  23  GLUCOSE  --   --  139* 217* 129* 143* 174*  --  167*  BUN  --   --  11 16 12 6 6   --  8  CREATININE  --   --  1.02 1.19 1.02 1.03 0.99  --  0.98  CALCIUM  --   --  7.5* 6.8* 7.1* 7.1* 7.1*  --  7.0*  MG 1.5*  --  2.1  --   --   --  1.3* 2.4  --   < > = values in this interval not displayed.  CBG:  Recent Labs Lab 12/10/14 2135 12/11/14 0033 12/11/14 0344 12/11/14 0756 12/11/14 1135  GLUCAP 166* 168* 132* 187* 168*    GFR Estimated Creatinine Clearance: 74.7 mL/min (by C-G formula based on Cr of 0.98).  Coagulation profile  Recent Labs Lab 12/04/14 1636 12/05/14 0517  INR 1.36 1.37    Cardiac Enzymes  Recent Labs Lab 12/04/14 1805 12/04/14 2300 12/05/14 0517  TROPONINI 0.04* <0.03 <0.03    Invalid input(s): POCBNP No results for input(s): DDIMER in the last 72 hours. No results for input(s): HGBA1C in the last 72 hours. No results for input(s): CHOL, HDL, LDLCALC, TRIG, CHOLHDL, LDLDIRECT in the last 72 hours. No results for input(s): TSH, T4TOTAL, T3FREE, THYROIDAB in the last 72 hours.  Invalid input(s): FREET3 No results for input(s): VITAMINB12, FOLATE, FERRITIN, TIBC, IRON, RETICCTPCT in the last 72 hours. No results for input(s): LIPASE, AMYLASE in the last 72 hours.  Urine Studies No results for input(s): UHGB, CRYS in the last 72 hours.  Invalid input(s): UACOL, UAPR, USPG, UPH, UTP, UGL, UKET, UBIL, UNIT, UROB, ULEU, UEPI, UWBC, URBC, UBAC, CAST, UCOM, BILUA  MICROBIOLOGY: Recent Results (from the past 240 hour(s))  Culture, blood (routine x 2)     Status: None  Collection Time: 12/02/14  9:17 AM  Result Value Ref Range Status   Specimen Description BLOOD LEFT ANTECUBITAL  Final   Special Requests BOTTLES DRAWN AEROBIC AND ANAEROBIC 5CC  Final    Culture NO GROWTH 5 DAYS  Final   Report Status 12/07/2014 FINAL  Final  Culture, blood (routine x 2)     Status: None   Collection Time: 12/02/14 12:14 PM  Result Value Ref Range Status   Specimen Description BLOOD LEFT ANTECUBITAL  Final   Special Requests BOTTLES DRAWN AEROBIC AND ANAEROBIC 5CC  Final   Culture NO GROWTH 5 DAYS  Final   Report Status 12/07/2014 FINAL  Final  Clostridium Difficile by PCR (not at [facility])     Status: None   Collection Time: 12/03/14 11:17 AM  Result Value Ref Range Status   Toxigenic C Difficile by pcr NEGATIVE NEGATIVE Final  Culture, Urine     Status: None   Collection Time: 12/03/14  3:16 PM  Result Value Ref Range Status   Specimen Description URINE, RANDOM  Final   Special Requests NONE  Final   Culture 30,000 COLONIES/mL ENTEROCOCCUS SPECIES  Final   Report Status 12/06/2014 FINAL  Final   Organism ID, Bacteria ENTEROCOCCUS SPECIES  Final      Susceptibility   Enterococcus species - MIC*    AMPICILLIN <=2 SENSITIVE Sensitive     LEVOFLOXACIN 1 SENSITIVE Sensitive     NITROFURANTOIN <=16 SENSITIVE Sensitive     VANCOMYCIN <=0.5 SENSITIVE Sensitive     LINEZOLID 2 SENSITIVE Sensitive     * 30,000 COLONIES/mL ENTEROCOCCUS SPECIES  Gram stain     Status: None   Collection Time: 12/04/14 12:11 PM  Result Value Ref Range Status   Specimen Description PLEURAL  Final   Special Requests NONE  Final   Gram Stain   Final    ABUNDANT WBC PRESENT,BOTH PMN AND MONONUCLEAR FEW GRAM POSITIVE COCCI IN CHAINS IN CLUSTERS FEW GRAM NEGATIVE COCCI IN CHAINS IN CLUSTERS CRITICAL RESULT CALLED TO, READ BACK BY AND VERIFIED WITH: [place],S RN 12/04/14 1927 [place] CONFIRMED BY V WILKINS    Report Status 12/04/2014 FINAL  Final  Fungus Culture with Smear     Status: None (Preliminary result)   Collection Time: 12/04/14 12:11 PM  Result Value Ref Range Status   Specimen Description PLEURAL  Final   Special Requests NONE  Final   Fungal Smear   Final    NO  YEAST OR FUNGAL ELEMENTS SEEN Performed at [facility]    Culture   Final    CULTURE IN PROGRESS FOR FOUR WEEKS Performed at [facility]    Report Status PENDING  Incomplete  Culture, body fluid-bottle     Status: None   Collection Time: 12/04/14 12:11 PM  Result Value Ref Range Status   Specimen Description PLEURAL  Final   Special Requests NONE  Final   Culture NO GROWTH 5 DAYS  Final   Report Status 12/09/2014 FINAL  Final  Surgical pcr screen     Status: None   Collection Time: 12/05/14  1:23 AM  Result Value Ref Range Status   MRSA, PCR NEGATIVE NEGATIVE Final   Staphylococcus aureus NEGATIVE NEGATIVE Final    Comment:        The Xpert SA Assay (FDA approved for NASAL specimens in patients over 62 years of age), is one component of a comprehensive surveillance program.  Test performance has been validated by [facility] for patients  greater than or equal to 4 year old. It is not intended to diagnose infection nor to guide or monitor treatment.   Culture, bal-quantitative     Status: None   Collection Time: 12/05/14 10:09 AM  Result Value Ref Range Status   Specimen Description BRONCHIAL ALVEOLAR LAVAGE  Final   Special Requests BRONCHIAL WASHINGS  Final   Gram Stain   Final    NO WBC SEEN NO SQUAMOUS EPITHELIAL CELLS SEEN NO ORGANISMS SEEN Performed at [facility]    Colony Count NO GROWTH Performed at [facility]   Final   Culture   Final    NO GROWTH 2 DAYS Performed at [facility]    Report Status 12/07/2014 FINAL  Final  Fungus Culture with Smear     Status: None (Preliminary result)   Collection Time: 12/05/14 10:09 AM  Result Value Ref Range Status   Specimen Description BRONCHIAL ALVEOLAR LAVAGE  Final   Special Requests BRONCHIAL WASHINGS  Final   Fungal Smear   Final    NO YEAST OR FUNGAL ELEMENTS SEEN Performed at [facility]    Culture   Final    CULTURE IN PROGRESS FOR FOUR  WEEKS Performed at [facility]    Report Status PENDING  Incomplete  AFB culture with smear     Status: None (Preliminary result)   Collection Time: 12/05/14 10:09 AM  Result Value Ref Range Status   Specimen Description BRONCHIAL ALVEOLAR LAVAGE  Final   Special Requests BRONCHIAL WASHINGS  Final   Acid Fast Smear   Final    NO ACID FAST BACILLI SEEN Performed at [facility]    Culture   Final    CULTURE WILL BE EXAMINED FOR 6 WEEKS BEFORE ISSUING A FINAL REPORT Performed at [facility]    Report Status PENDING  Incomplete  AFB culture with smear     Status: None (Preliminary result)   Collection Time: 12/05/14 10:42 AM  Result Value Ref Range Status   Specimen Description PLEURAL RIGHT FLUID  Final   Special Requests SPEC B  Final   Acid Fast Smear   Final    NO ACID FAST BACILLI SEEN Performed at [facility]    Culture   Final    CULTURE WILL BE EXAMINED FOR 6 WEEKS BEFORE ISSUING A FINAL REPORT Performed at [facility]    Report Status PENDING  Incomplete  Anaerobic culture     Status: None   Collection Time: 12/05/14 10:42 AM  Result Value Ref Range Status   Specimen Description PLEURAL RIGHT FLUID  Final   Special Requests SPEC D  Final   Gram Stain   Final    FEW WBC PRESENT, PREDOMINANTLY PMN NO SQUAMOUS EPITHELIAL CELLS SEEN FEW GRAM POSITIVE COCCI IN PAIRS    Culture NO ANAEROBES ISOLATED  Final   Report Status 12/11/2014 FINAL  Final  Body fluid culture     Status: None   Collection Time: 12/05/14 10:42 AM  Result Value Ref Range Status   Specimen Description PLEURAL  Final   Special Requests NONE  Final   Gram Stain   Final    DEGENERATE WBC PRESENT ON SLIDE FEW GRAM VARIABLE COCCI CRITICAL RESULT CALLED TO, READ BACK BY AND VERIFIED WITH: [place] RN 12/05/14 2050 [place] BY V WILKINS    Culture   Final    ABUNDANT MICROAEROPHILIC STREPTOCOCCI Standardized susceptibility testing for this  organism is not  available.    Report Status 12/09/2014 FINAL  Final  Tissue culture     Status: None   Collection Time: 12/05/14 10:51 AM  Result Value Ref Range Status   Specimen Description TISSUE  Final   Special Requests RIGHT PLEURAL PEEL CUP C PT ON VANC  Final   Gram Stain   Final    MODERATE WBC PRESENT, PREDOMINANTLY PMN NO SQUAMOUS EPITHELIAL CELLS SEEN ABUNDANT GRAM POSITIVE COCCI IN PAIRS IN CHAINS Performed at [facility]    Culture   Final    ABUNDANT MICROAEROPHILIC STREPTOCOCCI Note: Standardized susceptibility testing for this organism is not available. Performed at [facility]    Report Status 12/08/2014 FINAL  Final  Fungus Culture with Smear     Status: None (Preliminary result)   Collection Time: 12/05/14 10:51 AM  Result Value Ref Range Status   Specimen Description TISSUE  Final   Special Requests RIGHT PLEURAL PEEL CUP C PT ON VANC  Final   Fungal Smear   Final    NO YEAST OR FUNGAL ELEMENTS SEEN Performed at [facility]    Culture   Final    CULTURE IN PROGRESS FOR FOUR WEEKS Performed at [facility]    Report Status PENDING  Incomplete  AFB culture with smear     Status: None (Preliminary result)   Collection Time: 12/05/14 10:51 AM  Result Value Ref Range Status   Specimen Description TISSUE  Final   Special Requests RIGHT PLEURAL PEEL CUP C PT ON VANC  Final   Acid Fast Smear   Final    NO ACID FAST BACILLI SEEN Performed at [facility]    Culture   Final    CULTURE WILL BE EXAMINED FOR 6 WEEKS BEFORE ISSUING A FINAL REPORT Performed at [facility]    Report Status PENDING  Incomplete    RADIOLOGY STUDIES/RESULTS: Dg Chest 1 View  12/04/2014   CLINICAL DATA:  Post right thoracentesis  EXAM: CHEST  1 VIEW  COMPARISON:  12/02/2014  FINDINGS: Diffuse right lung airspace disease again noted, slightly improved. Decreasing right effusion following thoracentesis. No pneumothorax. Heart is  borderline in size. Nodular density projecting over the left lung base is felt represent nipple shadow. Left lung is clear. No acute bony abnormality.  IMPRESSION: Decreasing right effusion and diffuse right lung airspace disease. No pneumothorax.   Electronically Signed   By: Rolm Baptise M.D.   On: 12/04/2014 13:10   Dg Chest 2 View  12/02/2014   CLINICAL DATA:  Altered level of consciousness.  EXAM: CHEST  2 VIEW  COMPARISON:  Sep 04, 2012.  FINDINGS: The heart size and mediastinal contours are within normal limits. No pneumothorax is noted. Left lung is clear. There is now noted diffuse opacification of the right lower lobe most consistent with pneumonia with associated pleural effusion. The visualized skeletal structures are unremarkable.  IMPRESSION: Diffuse opacification of the right lower lobe most consistent with pneumonia with associated pleural effusion. Follow-up radiographs are recommended.   Electronically Signed   By: Marijo Conception, M.D.   On: 12/02/2014 10:57   Ct Chest Wo Contrast  12/04/2014   CLINICAL DATA:  Right hemithorax opacification, assess hemothorax, effusion and loculation. Review of electronic records demonstrates history of productive cough and shortness of breath for 3 weeks. Recent falls.  EXAM: CT CHEST WITHOUT CONTRAST  TECHNIQUE: Multidetector CT imaging of the chest was performed following the standard protocol without IV contrast.  COMPARISON:  Chest radiograph 12/02/2014  FINDINGS: Large volume of loculated right pleural fluid. Within the right lower hemithorax are multiple foci of air within the large volume pleural fluid raising concern for empyema. There is mass effect on the adjacent bronchi, with compressive atelectasis of the dependent right upper lobe and consolidation in the right middle lobe with air bronchograms. The origin of the right lower lobe bronchus is not well-defined, and no aerated right lower lobe lung is confidently identified. Pleural fluid is  heterogeneous in density.  There is a small left pleural effusion that appears simple fluid in density. Adjacent compressive atelectasis in the lower lobe. The left upper lobe is clear.  Heart at the upper limits of normal in size. There are dense coronary artery calcifications. No definite pericardial fluid. Prominent small superior mediastinal lymph nodes measuring 9 mm short axis dimension. Limited assessment for hilar adenopathy. Mild atherosclerosis of normal caliber thoracic aorta.  No definite acute abnormality in the included upper abdomen, perinephric stranding is partially included.  There are no acute rib fractures. Remote nondisplaced lower right rib fractures with well-formed callus. Age related degenerative change throughout spine. No blastic or destructive lytic osseous lesions.  IMPRESSION: 1. Large partially loculated heterogeneous right pleural effusion. In the lower portion are multiple foci of air. Findings are most concerning for empyema. There is consolidation or atelectasis of the entire right lower lobe, consolidation with air bronchograms in the right middle lobe, and compressive atelectasis of the adjacent right upper lobe. Hemothorax is felt less likely, given the right lower rib fractures appear remote. 2. Small left pleural effusion with adjacent compressive atelectasis. 3. Dense coronary artery calcifications.   Electronically Signed   By: Jeb Levering M.D.   On: 12/04/2014 02:42   Dg Chest Port 1 View  12/11/2014   CLINICAL DATA:  69 year old male with history of empyema.  EXAM: PORTABLE CHEST - 1 VIEW  COMPARISON:  Chest x-ray 12/10/2014.  FINDINGS: Previously noted right-sided chest tube has been removed. No appreciable right pneumothorax. Right IJ catheter with tip terminating in the distal superior vena cava. Feeding tube extending into the upper abdomen, with tip below the lower margin of the image. Lung volumes are slightly low. Bibasilar opacities may reflect areas of  atelectasis and/or consolidation. Small to moderate bilateral pleural effusions. Cephalization of pulmonary vasculature. Mild cardiomegaly. Upper mediastinal contours are slightly distorted by patient positioning.  IMPRESSION: 1. Support apparatus, as above. 2. No definite right pneumothorax following right-sided chest tube removal. 3. Bibasilar opacities may reflect areas of atelectasis and/or consolidation, with superimposed small to moderate bilateral pleural effusions. 4. Cardiomegaly with pulmonary venous congestion, but no frank pulmonary edema.   Electronically Signed   By: Vinnie Langton M.D.   On: 12/11/2014 08:36   Dg Chest Port 1 View  12/10/2014   CLINICAL DATA:  69 year old male status post VATS for empyema. Initial encounter.  EXAM: PORTABLE CHEST - 1 VIEW  COMPARISON:  12/09/2014 and earlier.  FINDINGS: Portable AP semi upright view at 0710 hrs. Enteric feeding tube is been placed, tip not included. 1 of the 2 right chest tubes has been removed. Single residual right side chest tube and right IJ central line appear stable. Stable lung volumes. Stable cardiac size and mediastinal contours. Residual peripheral and basilar opacity in the right lung is stable. No pneumothorax. Left lower lobe atelectasis.  IMPRESSION: 1. One right chest tube removed, 1 remains. No pneumothorax identified. 2. Feeding tube placed, tip not included. 3. Stable  right lung ventilation. Left lung remarkable for lower lobe atelectasis.   Electronically Signed   By: Genevie Ann M.D.   On: 12/10/2014 07:49   Dg Chest Port 1 View  12/09/2014   CLINICAL DATA:  Post VATS, empyema, history prostate cancer, hypertension, diabetes mellitus  EXAM: PORTABLE CHEST - 1 VIEW  COMPARISON:  Portable exam 0647 hours compared 12/08/2014  FINDINGS: RIGHT jugular central venous catheter with tip projecting over SVC.  Pair of RIGHT thoracostomy tubes unchanged.  Mild enlargement of cardiac silhouette.  Stable mediastinal contours and pulmonary  vascularity.  Mild residual pleural-based opacity in the RIGHT hemi thorax with persisting consolidation in RIGHT lower lobe.  LEFT lung clear.  No pneumothorax.  IMPRESSION: Stable RIGHT thoracostomy tubes with persistent RIGHT lower lobe consolidation and mild residual pleural-based opacity.   Electronically Signed   By: Lavonia Dana M.D.   On: 12/09/2014 07:55   Dg Chest Port 1 View  12/08/2014   CLINICAL DATA:  Empyema  EXAM: PORTABLE CHEST - 1 VIEW  COMPARISON:  12/07/2014  FINDINGS: Three chest tubes on the right are unchanged in position. No pneumothorax. Right lower lobe infiltrate. Minimal right effusion.  Left lower lobe atelectasis has progressed.  Negative for heart failure. Right jugular catheter tip in the SVC. Endotracheal tube and NG tubes removed.  IMPRESSION: Three chest tubes remain on the right without pneumothorax. Right lower lobe consolidation remains.  Increase in left lower lobe atelectasis.  Endotracheal tube removed in the interval.   Electronically Signed   By: Franchot Gallo M.D.   On: 12/08/2014 08:03   Dg Chest Port 1 View  12/07/2014   CLINICAL DATA:  Empyema.  EXAM: PORTABLE CHEST - 1 VIEW  COMPARISON:  12/06/2014  FINDINGS: The endotracheal tube tip is just below the clavicular heads. The orogastric tube reaches the stomach at least. Right IJ central line, tip at the distal SVC.  3 right-sided thoracostomy tubes are in stable position. There is no definitive pneumothorax. Compared to preoperative study, pleural effusion remains decreased. Right basilar pneumonia is stable. Streaky left lower lobe opacity has slowly increased over the past 2 days, consistent with atelectasis. No pulmonary edema.  IMPRESSION: 1. Stable positioning of tubes and central line. 2. Recent right empyema drainage with no pneumothorax or increasing pleural fluid. 3. Stable right basilar pneumonia and left basilar atelectasis.   Electronically Signed   By: Monte Fantasia M.D.   On: 12/07/2014 07:41    Portable Chest Xray  12/06/2014   CLINICAL DATA:  Hypoxia  EXAM: PORTABLE CHEST - 1 VIEW  COMPARISON:  December 05, 2014  FINDINGS: Endotracheal tube tip is 5.3 cm above the carina. Central catheter tip is in the superior cava. Nasogastric tube tip and side port are below the diaphragm. Chest tubes are again noted on the right, unchanged in position. There is no appreciable pneumothorax. There is slightly less airspace consolidation in the right lower lung zone compared to 1 day prior. There is a mild degree of effusion on the right, stable. The left lung is essentially clear. Heart size and pulmonary vascularity are normal. No adenopathy.  IMPRESSION: Tube and catheter positions as described without pneumothorax. Partial clearing of consolidation right base. No new opacity. Small right effusion. Left lung clear. No change in cardiac silhouette.   Electronically Signed   By: Lowella Grip III M.D.   On: 12/06/2014 07:42   Dg Chest Port 1 View  12/05/2014   CLINICAL DATA:  Acute respiratory failure,  hypoxia.  EXAM: PORTABLE CHEST - 1 VIEW  COMPARISON:  12/05/2014  FINDINGS: Right chest tubes and remainder support devices remain in place, unchanged. No pneumothorax. Interval placement of the NG tube into the stomach. Patchy right lung airspace disease slightly worsened since prior study. No confluent opacity on the left. Heart is normal size.  IMPRESSION: Worsening right lung airspace disease. Right chest tubes remain in place without pneumothorax.   Electronically Signed   By: Rolm Baptise M.D.   On: 12/05/2014 16:36   Dg Chest Port 1 View  12/05/2014   CLINICAL DATA:  Pneumothorax.  EXAM: PORTABLE CHEST - 1 VIEW  COMPARISON:  Radiograph 12/04/2014, CT 12/04/2014, chest radiograph 12/02/2014  FINDINGS: 3 RIGHT chest tubes noted. There is interval decrease in volume of the pleural fluid in the RIGHT hemi thorax. No appreciable pneumothorax identified. RIGHT central venous line is in place with tip in the  distal SVC. Endotracheal tube is 3.8 cm from carina. LEFT lung is clear.  IMPRESSION: 1. Decrease in pleural fluid in the RIGHT hemi thorax. 2. Three right chest tubes in place without pneumothorax. 3. Endotracheal tube and central venous line appear in good position.   Electronically Signed   By: Suzy Bouchard M.D.   On: 12/05/2014 14:09   Dg Abd Portable 1v  12/11/2014   CLINICAL DATA:  Nasogastric tube placement.  Initial encounter.  EXAM: PORTABLE ABDOMEN - 1 VIEW  COMPARISON:  Abdominal radiograph performed 12/09/2014, and chest radiograph performed earlier today at 7:10 a.m.  FINDINGS: The patient's enteric tube is noted ending overlying the third segment of the duodenum, at appropriate position.  The visualized bowel gas pattern is grossly unremarkable, with scattered air filled loops of small and large bowel seen.  A small right pleural effusion is noted, with right basilar airspace opacification. No free intra-abdominal air is identified, though evaluation for free air is limited on a single supine view.  Mild degenerative change is noted at the lower lumbar spine.  IMPRESSION: 1. Enteric tube noted ending overlying the third segment of the duodenum, at appropriate position. 2. Small right pleural effusion, with right basilar airspace opacification, mildly more prominent than on the prior study.   Electronically Signed   By: Garald Balding M.D.   On: 12/11/2014 02:01   Dg Abd Portable 1v  12/09/2014   CLINICAL DATA:  Feeding tube placement .  EXAM: PORTABLE ABDOMEN - 1 VIEW  COMPARISON:  None.  FINDINGS: Feeding tube noted with its tip projected over the descending portion of the duodenum. No gastric distention. Degenerative changes lumbar spine. Vascular calcification.  IMPRESSION: Feeding tube noted with tip projected over the descending portion of the duodenum. No gastric distention noted.   Electronically Signed   By: Belk   On: 12/09/2014 13:09   Dg Swallowing Func-speech  Pathology  12/04/2014    Objective Swallowing Evaluation:    Patient Details  Name: KEVRON PATELLA MRN: [id number] Date of Birth: 1945/06/30  Today's Date: 12/04/2014 Time: SLP Start Time (ACUTE ONLY): 1235-SLP Stop Time (ACUTE ONLY): 1258 SLP Time Calculation (min) (ACUTE ONLY): 23 min  Past Medical History:  Past Medical History  Diagnosis Date  . BPH (benign prostatic hypertrophy)   . Prostate cancer     implants   . Vitamin D deficiency     takes Vit D daily  . History of ETT 1999    Dr, Caleen Essex  . Anxiety     takes Valium daily as needed  .  Hyperlipidemia     takes Simvastatin nightly  . Hypertension     takes Quinapril and Procardia daily  . Headache(784.0)     occasionally  . Peripheral neuropathy   . Diabetes mellitus, type 2     but doesn't take any meds for it;diet controlled and exercise  . 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 Pneumonia 1960's?  . CAP (community acquired pneumonia) 12/02/2014   Past Surgical History:  Past Surgical History  Procedure Laterality Date  . Foot surgery  May 2002    Dr. Sharol Given   . Left foot casted 4-5 months    . Bilateral  eye laser surgery Bilateral 2000    "related to diabetes"  . Charcot foot left Left 12/15/01  . Laser eye surgery right  12/2002  . Eye surgery    . Tonsillectomy    . I&d extremity Left 09/05/2012    Procedure: IRRIGATION AND DEBRIDEMENT EXTREMITY;  Surgeon: Newt Minion, MD;  Location: [facility];  Service: Orthopedics;  Laterality: Left;   Excision Charcot Collapse Left Foot and Base 5th Metatarsal, Antibiotic  Beads  . Esophagogastroduodenoscopy      at least 40yrs ago  . 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 male with PMH of Diabetes  type 2 with Charcot foot supposedly diet-controlled, hypertension,  generalized anxiety disorder, history of atrial fibrillation not  on  anticoagulation presumably secondary to falls. Patient presents to the  hospital with 3 weeks of worsening productive cough with purulent sputum.  Guaifenesin has been helpful, but has not resolved his cough. He also has  been having increased weakness with some mild dyspnea on exertion which is  improved with rest. Also noted concerns of very dry mouth and poor oral  hygiene. CXR 7/28 revealed diffuse opacification of RLL consistent with  PNA with associated pleural effusion. Bedside swallow eval ordered to aide  in ruling out aspiration.   No Data Recorded  Assessment / Plan / Recommendation CHL IP CLINICAL IMPRESSIONS 12/04/2014  Therapy Diagnosis Mild oral phase dysphagia;Moderate pharyngeal phase  dysphagia;Mild cervical esophageal phase dysphagia  Clinical Impression Pt currently demonstrating a mild oral/ moderate  pharyngeal dysphagia that appears sensorimotor in nature. Delay in swallow  initiation accompanied by reduced airway protection (reduced base of  tongue retraction and minimal epiglottic inversion) resulted in  penetration during the swallow x1 of thin liquids. Reduced epiglottic  inversion/ base of tongue retraction also resulted in moderate-severe  amounts of vallecular residuals across consistencies which led to  penetration post-swallow x1 of thin liquids- pt had a weak throat clear  response and cleared remaining penetrates with cued cough. Pt is not able  to control sip size and large sip of nectar thick liquids resulted in  significant residuals in vallecula, about half of which were cleared with  multiple swallows and cough/ throat clears (cued- pt did not sense).  Nectar thick liquids by teaspoon accompanied by chin tuck reduced  residuals and controlled sip size. Minimal backflow to cervical esophagus  observed across consistencies. Recommend initiating dysphagia 2 diet,  nectar thick liquids by teaspoon, meds crushed in puree, full supervision  to cue pt to tuck chin, swallow 2x with  chin down. Also have pt sit  upright 30 minutes after meal.  Pt was able to follow instructions for  strategies during MBS with a visual cue- hopeful that this will continue  at bedside as well but decreased cognitive status continues to put pt at  increased risk of aspiration. SLP will continue to follow closely for diet  tolerance/ consider advancement or repeat MBS as cognitive status  improves.      CHL IP TREATMENT RECOMMENDATION 12/04/2014  Treatment Recommendations Therapy as outlined in treatment plan below     CHL IP DIET RECOMMENDATION 12/04/2014  SLP Diet Recommendations Dysphagia 2 (Fine chop);Nectar  Liquid Administration via (None)  Medication Administration Crushed with puree  Compensations Slow rate;Small sips/bites;Multiple dry swallows after each  bite/sip;Clear throat intermittently;Chin tuck  Postural Changes and/or Swallow Maneuvers (None)     CHL IP OTHER RECOMMENDATIONS 12/04/2014  Recommended Consults (None)  Oral Care Recommendations Oral care BID  Other Recommendations Order thickener from pharmacy;Clarify dietary  restrictions     No flowsheet data found.   CHL IP FREQUENCY AND DURATION 12/04/2014  Speech Therapy Frequency (ACUTE ONLY) min 2x/week  Treatment Duration 2 weeks     Pertinent Vitals/Pain none    SLP Swallow Goals No flowsheet data found.  No flowsheet data found.    CHL IP REASON FOR REFERRAL 12/04/2014  Reason for Referral Objectively evaluate swallowing function     CHL IP ORAL PHASE 12/04/2014  Lips (None)  Tongue (None)  Mucous membranes (None)  Nutritional status (None)  Other (None)  Oxygen therapy (None)  Oral Phase Impaired  Oral - Pudding Teaspoon (None)  Oral - Pudding Cup (None)  Oral - Honey Teaspoon (None)  Oral - Honey Cup (None)  Oral - Honey Syringe (None)  Oral - Nectar Teaspoon (None)  Oral - Nectar Cup (None)  Oral - Nectar Straw (None)  Oral - Nectar Syringe (None)  Oral - Ice Chips (None)  Oral - Thin Teaspoon (None)  Oral - Thin Cup (None)  Oral - Thin Straw  (None)  Oral - Thin Syringe (None)  Oral - Puree (None)  Oral - Mechanical Soft (None)  Oral - Regular (None)  Oral - Multi-consistency (None)  Oral - Pill (None)  Oral Phase - Comment (None)      CHL IP PHARYNGEAL PHASE 12/04/2014  Pharyngeal Phase Impaired  Pharyngeal - Pudding Teaspoon (None)  Penetration/Aspiration details (pudding teaspoon) (None)  Pharyngeal - Pudding Cup (None)  Penetration/Aspiration details (pudding cup) (None)  Pharyngeal - Honey Teaspoon (None)  Penetration/Aspiration details (honey teaspoon) (None)  Pharyngeal - Honey Cup (None)  Penetration/Aspiration details (honey cup) (None)  Pharyngeal - Honey Syringe (None)  Penetration/Aspiration details (honey syringe) (None)  Pharyngeal - Nectar Teaspoon (None)  Penetration/Aspiration details (nectar teaspoon) (None)  Pharyngeal - Nectar Cup (None)  Penetration/Aspiration details (nectar cup) (None)  Pharyngeal - Nectar Straw (None)  Penetration/Aspiration details (nectar straw) (None)  Pharyngeal - Nectar Syringe (None)  Penetration/Aspiration details (nectar syringe) (None)  Pharyngeal - Ice Chips (None)  Penetration/Aspiration details (ice chips) (None)  Pharyngeal - Thin Teaspoon (None)  Penetration/Aspiration details (thin teaspoon) (None)  Pharyngeal - Thin Cup (None)  Penetration/Aspiration details (thin cup) (None)  Pharyngeal - Thin Straw (None)  Penetration/Aspiration details (thin straw) (None)  Pharyngeal - Thin Syringe (None)  Penetration/Aspiration details (thin syringe') (None)  Pharyngeal - Puree (None)  Penetration/Aspiration details (puree) (None)  Pharyngeal - Mechanical Soft (None)  Penetration/Aspiration details (mechanical soft) (None)  Pharyngeal - Regular (None)  Penetration/Aspiration details (regular) (None)  Pharyngeal - Multi-consistency (None)  Penetration/Aspiration details (multi-consistency) (  None)  Pharyngeal - Pill (None)  Penetration/Aspiration details (pill) (None)  Pharyngeal Comment (None)      CHL IP  CERVICAL ESOPHAGEAL PHASE 12/04/2014  Cervical Esophageal Phase Impaired  Pudding Teaspoon (None)  Pudding Cup (None)  Honey Teaspoon (None)  Honey Cup (None)  Honey Straw (None)  Nectar Teaspoon Reduced cricopharyngeal relaxation;Esophageal backflow  into cervical esophagus  Nectar Cup Reduced cricopharyngeal relaxation;Esophageal backflow into  cervical esophagus  Nectar Straw (None)  Nectar Sippy Cup (None)  Thin Teaspoon (None)  Thin Cup Reduced cricopharyngeal relaxation;Esophageal backflow into  cervical esophagus  Thin Straw (None)  Thin Sippy Cup (None)  Cervical Esophageal Comment (None)    No flowsheet data found.         Kern Reap, MA, CCC-SLP 12/04/2014, 1:55 PM [id number]    Oren Binet, MD  [facility] Pager:[phone]  If 7PM-7AM, please contact night-coverage [email or web address] Password [email or web address] 12/11/2014, 3:30 PM   LOS: 9 days

## 2014-12-12 ENCOUNTER — Inpatient Hospital Stay (HOSPITAL_COMMUNITY): Payer: Medicare Other

## 2014-12-12 ENCOUNTER — Other Ambulatory Visit (HOSPITAL_COMMUNITY): Payer: Medicare Other

## 2014-12-12 DIAGNOSIS — R609 Edema, unspecified: Secondary | ICD-10-CM

## 2014-12-12 LAB — GLUCOSE, CAPILLARY
GLUCOSE-CAPILLARY: 146 mg/dL — AB (ref 65–99)
GLUCOSE-CAPILLARY: 135 mg/dL — AB (ref 65–99)
GLUCOSE-CAPILLARY: 157 mg/dL — AB (ref 65–99)
GLUCOSE-CAPILLARY: 139 mg/dL — AB (ref 65–99)
Glucose-Capillary: 158 mg/dL — ABNORMAL HIGH (ref 65–99)
Glucose-Capillary: 167 mg/dL — ABNORMAL HIGH (ref 65–99)

## 2014-12-12 MED ORDER — POTASSIUM CHLORIDE IN NACL 20-0.9 MEQ/L-% IV SOLN
INTRAVENOUS | Status: DC
  Administered 2014-12-12 (×2): via INTRAVENOUS
  Filled 2014-12-12 (×2): qty 1000

## 2014-12-12 MED ORDER — RISAQUAD PO CAPS
2.0000 | ORAL_CAPSULE | Freq: Every day | ORAL | Status: DC
  Administered 2014-12-12 – 2014-12-13 (×2): 2 via ORAL
  Filled 2014-12-12 (×2): qty 2

## 2014-12-12 MED ORDER — IPRATROPIUM-ALBUTEROL 0.5-2.5 (3) MG/3ML IN SOLN
3.0000 mL | Freq: Three times a day (TID) | RESPIRATORY_TRACT | Status: DC
  Administered 2014-12-13 – 2014-12-19 (×18): 3 mL via RESPIRATORY_TRACT
  Filled 2014-12-12 (×20): qty 3

## 2014-12-12 MED ORDER — DEXTROSE 5 % IV SOLN
1.0000 g | INTRAVENOUS | Status: DC
  Administered 2014-12-12 – 2014-12-17 (×6): 1 g via INTRAVENOUS
  Filled 2014-12-12 (×7): qty 10

## 2014-12-12 MED ORDER — LEVOFLOXACIN IN D5W 750 MG/150ML IV SOLN
750.0000 mg | INTRAVENOUS | Status: DC
  Administered 2014-12-12: 750 mg via INTRAVENOUS
  Filled 2014-12-12: qty 150

## 2014-12-12 MED ORDER — IPRATROPIUM-ALBUTEROL 0.5-2.5 (3) MG/3ML IN SOLN
3.0000 mL | Freq: Four times a day (QID) | RESPIRATORY_TRACT | Status: DC | PRN
  Administered 2014-12-17: 3 mL via RESPIRATORY_TRACT
  Filled 2014-12-12: qty 3

## 2014-12-12 NOTE — Progress Notes (Signed)
Patient removed panda from right nare after sitter leaving the room to end shift. On call Md made aware that there will be no sitter till 7am next shift. Plan to hold off replacement of new panda and hold TF at this time till next shift and sitter to be in room.

## 2014-12-12 NOTE — Progress Notes (Signed)
VASCULAR LAB PRELIMINARY  PRELIMINARY  PRELIMINARY  PRELIMINARY  Bilateral upper extremity venous duplex completed.    Preliminary report:  There is no DVT or SVT noted in the bilateral upper extremities.  There is significant interstitial fluid throughout the upper extremities, bilaterally.   Logan May, RVT 12/12/2014, 9:26 AM

## 2014-12-12 NOTE — Progress Notes (Addendum)
PATIENT DETAILS Name: Logan May Age: 69 y.o. 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 PCCM was consulted, underwent thoracocentesis on 7/30-pleural fluid was consistent with an empyema. Subsequently cardiothoracic surgery was consulted, and a right sided VATS, drainage of empyema and visceral/pleural decortication was done on 7/31. Patient subsequently developed respiratory failure and required mechanical ventilation till 8/3. ICU course was complicated by development of atrial fibrillation with RVR requiring amiodarone infusion and severe delirium thought to be secondary to alcohol withdrawal. Upon clinical stability, patient was transferred to the hospitalist service on 8/6.  Subjective:  Is confused but denies any headache, no chest or abdominal pain. Is following basic commands. Denies any focal weakness.  Assessment/Plan:  Severe sepsis:secondary to right sided PNA and empyema. Sepsis pathophysiology has resolved following VATS/decortication. Blood culture neg, but tissue culture positive for microaerophilic streptococcus will taper down to Rocephin at this point.   Right sided Empyema: underwent VATS, drainage of empyema and visceral/pleural decortication on 7/31.Tissue culture 7/31 positive for Microaerophilic streptococci,  AFB culture/fungal culture negative so far.ABX adjusted based on cultures -Abx stop date 8/13  Acute Respiratory Failure: developed VDRF on 7/31 post VATS/decortication, successfully extubated on  8/2.  Acute Urinary Retention:underwent Flexible cytoscopy and passage of foley catheter by Dr Gaynelle Arabian on 7/31  Afib HYQ:MVHQIONG Amiodarone gtt for rate control, now on prn lopressor. Felt to be a poor candidate for long term anticoagulation-although has a CHADS2Vasc SCORE of  3. Stared on ASA  ETOH withdrawal with delirium and metabolic encephalopathy:required Precedex gtt while in [place] less agitated-still somewhat confused-continue Ativan per CIWA protocol. Counseled to quit alcohol.  Severe Dysphagia:PANDA in place-has hx of dysphagia prior to admit-now likely worsened by acute illness. Keep NPO, continue PANDA feeds, await further SLP follow up  Enterococcal UTI: Seen on urine culture 7/29-this is pansensitive. Has been treated with Zosyn.  Left upper Ext swelling: stable Doppler  Anemia:likely secondary to  critical illness. Follow CBC and transfuse prn if HB<7  Hx of COPD with emphysema:continue bronchodilators-lungs clear  Dyslipidemia:continue Statin  Severe Deconditioning:PT on board-SNF on discharge  Type 2 DM: CBG's stable with SSI. A1C 6.8 on 7/29  CBG (last 3)   Recent Labs  12/12/14 0015 12/12/14 0438 12/12/14 0810  GLUCAP 167* 146* 135*       Disposition: Remain inpatient-SNF on discharge   Antimicrobial agents  See below  Anti-infectives    Start     Dose/Rate Route Frequency Ordered Stop   12/10/14 2100  piperacillin-tazobactam (ZOSYN) IVPB 3.375 g     3.375 g 12.5 mL/hr over 240 Minutes Intravenous Every 8 hours 12/10/14 1432 12/18/14 0459   12/05/14 0900  vancomycin (VANCOCIN) IVPB 1000 mg/200 mL premix  Status:  Discontinued     1,000 mg 200 mL/hr over 60 Minutes Intravenous Every 12 hours 12/04/14 2003 12/09/14 0736   12/05/14 0600  vancomycin (VANCOCIN) IVPB 1000 mg/200 mL premix  Status:  Discontinued     1,000 mg 200 mL/hr over 60 Minutes Intravenous To Surgery 12/04/14 1604 12/04/14  2000   12/04/14 2100   piperacillin-tazobactam (ZOSYN) IVPB 3.375 g  Status:  Discontinued     3.375 g 12.5 mL/hr over 240 Minutes Intravenous Every 8 hours 12/04/14 2003 12/10/14 1432   12/04/14 2100  vancomycin (VANCOCIN) 1,500 mg in sodium chloride 0.9 % 500 mL IVPB     1,500 mg 250 mL/hr over 120 Minutes Intravenous  Once 12/04/14 2003 12/04/14 2348   12/03/14 1330  levofloxacin (LEVAQUIN) IVPB 750 mg  Status:  Discontinued     750 mg 100 mL/hr over 90 Minutes Intravenous Every 24 hours 12/02/14 1514 12/04/14 1951   12/02/14 1130  cefTRIAXone (ROCEPHIN) 1 g in dextrose 5 % 50 mL IVPB     1 g 100 mL/hr over 30 Minutes Intravenous  Once 12/02/14 1118 12/02/14 1246   12/02/14 1130  azithromycin (ZITHROMAX) 500 mg in dextrose 5 % 250 mL IVPB     500 mg 250 mL/hr over 60 Minutes Intravenous  Once 12/02/14 1118 12/02/14 1429      DVT Prophylaxis: Prophylactic Lovenox   Code Status: Full code   Family Communication Wife bedside 12-12-14  Procedures: 7/30>>Thoracocentesis 7/31>>VATS, drainage of empyema and visceral/pleural decortication(Dr Hendrickson) 7/31>> Flexible cytoscopy and passage of foley catheter (Dr Gaynelle Arabian) 7/31>>8/3 ETT 12/12/2014 bilateral upper extremity venous duplex - no DVT SVT  CONSULTS:   pulmonary/intensive care, urology and CTVS  Time spent 40 minutes-Greater than 50% of this time was spent in counseling, explanation of diagnosis, planning of further management, and coordination of care.  MEDICATIONS: Scheduled Meds: . aspirin EC  81 mg Oral Daily  . atorvastatin  20 mg Oral q1800  . bisacodyl  10 mg Oral Daily  . cholecalciferol  2,000 Units Oral Once per day on Mon Tue Wed Thu Fri  . cholecalciferol  4,000 Units Oral Once per day on Sun Sat  . enoxaparin (LOVENOX) injection  40 mg Subcutaneous Q24H  . folic acid  1 mg Intravenous Daily  . insulin aspart  0-15 Units Subcutaneous 6 times per day  . ipratropium-albuterol  3 mL Nebulization Q6H  . metoprolol tartrate   25 mg Oral BID  . piperacillin-tazobactam (ZOSYN)  IV  3.375 g Intravenous Q8H  . senna-docusate  1 tablet Oral QHS  . thiamine IV  100 mg Intravenous Daily   Continuous Infusions: . 0.9 % NaCl with KCl 20 mEq / L 50 mL/hr at 12/12/14 0600  . feeding supplement (VITAL AF 1.2 CAL) Stopped (12/12/14 0300)   PRN Meds:.acetaminophen, albuterol, fentaNYL (SUBLIMAZE) injection, Gerhardt's butt cream, LORazepam, metoprolol, ondansetron (ZOFRAN) IV, sodium chloride    PHYSICAL EXAM: Vital signs in last 24 hours: Filed Vitals:   12/11/14 2052 12/11/14 2142 12/12/14 0254 12/12/14 0526  BP:  132/60  140/67  Pulse: 96 101  106  Temp:  98.5 F (36.9 C)  99.4 F (37.4 C)  TempSrc:  Oral  Oral  Resp: 20 16 18 18   Height:      Weight:  87.2 kg (192 lb 3.9 oz)    SpO2:  94%  95%    Weight change: 0.7 kg (1 lb 8.7 oz) Filed Weights   12/10/14 0500 12/11/14 0500 12/11/14 2142  Weight: 89.2 kg (196 lb 10.4 oz) 86.5 kg (190 lb 11.2 oz) 87.2 kg (192 lb 3.9 oz)   Body mass index is 26.82 kg/(m^2).   Gen Exam: Awake but confused-however follows some commands. Panda tube in place Neck: Supple, No JVD.  Chest: B/L Clear anteriorly CVS: S1 S2 Regular,  no murmurs.  Abdomen: soft, BS +, non tender Extremities: no edema, lower extremities warm to touch. Neurologic: Non Focal-but with gen weakness  Intake/Output from previous day:  Intake/Output Summary (Last 24 hours) at 12/12/14 1032 Last data filed at 12/12/14 1029  Gross per 24 hour  Intake 7078.34 ml  Output   2800 ml  Net 4278.34 ml     LAB RESULTS: CBC  Recent Labs Lab 12/07/14 0427 12/08/14 0507 12/09/14 0409 12/10/14 0416 12/11/14 0515  WBC 20.4* 11.0* 8.6 7.9 9.9  HGB 9.1* 8.3* 8.0* 8.0* 8.4*  HCT 28.7* 26.1* 24.9* 25.2* 26.5*  PLT 404* 331 366 353 429*  MCV 90.3 89.4 88.6 89.4 89.2  MCH 28.6 28.4 28.5 28.4 28.3  MCHC 31.7 31.8 32.1 31.7 31.7  RDW 14.9 15.0 15.2 15.7* 15.7*    Chemistries   Recent Labs Lab  12/06/14 0458 12/07/14 0427 12/08/14 0507 12/09/14 0409 12/10/14 0416 12/10/14 2225 12/11/14 0515  NA 139 136 139 139 141  --  143  K 3.9 3.9 3.7 3.3* 3.6  --  3.7  CL 109 106 110 109 111  --  115*  CO2 25 22 22 22 22   --  23  GLUCOSE 139* 217* 129* 143* 174*  --  167*  BUN 11 16 12 6 6   --  8  CREATININE 1.02 1.19 1.02 1.03 0.99  --  0.98  CALCIUM 7.5* 6.8* 7.1* 7.1* 7.1*  --  7.0*  MG 2.1  --   --   --  1.3* 2.4  --     CBG:  Recent Labs Lab 12/11/14 1603 12/11/14 2010 12/12/14 0015 12/12/14 0438 12/12/14 0810  GLUCAP 155* 98 167* 146* 135*    GFR Estimated Creatinine Clearance: 74.7 mL/min (by C-G formula based on Cr of 0.98).  Coagulation profile No results for input(s): INR, PROTIME in the last 168 hours.  Cardiac Enzymes No results for input(s): CKMB, TROPONINI, MYOGLOBIN in the last 168 hours.  Invalid input(s): CK  Invalid input(s): POCBNP No results for input(s): DDIMER in the last 72 hours. No results for input(s): HGBA1C in the last 72 hours. No results for input(s): CHOL, HDL, LDLCALC, TRIG, CHOLHDL, LDLDIRECT in the last 72 hours. No results for input(s): TSH, T4TOTAL, T3FREE, THYROIDAB in the last 72 hours.  Invalid input(s): FREET3 No results for input(s): VITAMINB12, FOLATE, FERRITIN, TIBC, IRON, RETICCTPCT in the last 72 hours. No results for input(s): LIPASE, AMYLASE in the last 72 hours.  Urine Studies No results for input(s): UHGB, CRYS in the last 72 hours.  Invalid input(s): UACOL, UAPR, USPG, UPH, UTP, UGL, UKET, UBIL, UNIT, UROB, ULEU, UEPI, UWBC, URBC, UBAC, CAST, UCOM, BILUA  MICROBIOLOGY: Recent Results (from the past 240 hour(s))  Culture, blood (routine x 2)     Status: None   Collection Time: 12/02/14 12:14 PM  Result Value Ref Range Status   Specimen Description BLOOD LEFT ANTECUBITAL  Final   Special Requests BOTTLES DRAWN AEROBIC AND ANAEROBIC 5CC  Final   Culture NO GROWTH 5 DAYS  Final   Report Status 12/07/2014 FINAL   Final  Clostridium Difficile by PCR (not at [facility])     Status: None   Collection Time: 12/03/14 11:17 AM  Result Value Ref Range Status   Toxigenic C Difficile by pcr NEGATIVE NEGATIVE Final  Culture, Urine     Status: None   Collection Time: 12/03/14  3:16 PM  Result Value Ref Range Status   Specimen Description URINE, RANDOM  Final   Special Requests NONE  Final   Culture 30,000 COLONIES/mL ENTEROCOCCUS SPECIES  Final   Report Status 12/06/2014 FINAL  Final   Organism ID, Bacteria ENTEROCOCCUS SPECIES  Final      Susceptibility   Enterococcus species - MIC*    AMPICILLIN <=2 SENSITIVE Sensitive     LEVOFLOXACIN 1 SENSITIVE Sensitive     NITROFURANTOIN <=16 SENSITIVE Sensitive     VANCOMYCIN <=0.5 SENSITIVE Sensitive     LINEZOLID 2 SENSITIVE Sensitive     * 30,000 COLONIES/mL ENTEROCOCCUS SPECIES  Gram stain     Status: None   Collection Time: 12/04/14 12:11 PM  Result Value Ref Range Status   Specimen Description PLEURAL  Final   Special Requests NONE  Final   Gram Stain   Final    ABUNDANT WBC PRESENT,BOTH PMN AND MONONUCLEAR FEW GRAM POSITIVE COCCI IN CHAINS IN CLUSTERS FEW GRAM NEGATIVE COCCI IN CHAINS IN CLUSTERS CRITICAL RESULT CALLED TO, READ BACK BY AND VERIFIED WITH: [place],S RN 12/04/14 1927 [place] CONFIRMED BY V WILKINS    Report Status 12/04/2014 FINAL  Final  Fungus Culture with Smear     Status: None (Preliminary result)   Collection Time: 12/04/14 12:11 PM  Result Value Ref Range Status   Specimen Description PLEURAL  Final   Special Requests NONE  Final   Fungal Smear   Final    NO YEAST OR FUNGAL ELEMENTS SEEN Performed at [facility]    Culture   Final    CULTURE IN PROGRESS FOR FOUR WEEKS Performed at [facility]    Report Status PENDING  Incomplete  Culture, body fluid-bottle     Status: None   Collection Time: 12/04/14 12:11 PM  Result Value Ref Range Status   Specimen Description PLEURAL  Final   Special Requests NONE   Final   Culture NO GROWTH 5 DAYS  Final   Report Status 12/09/2014 FINAL  Final  Surgical pcr screen     Status: None   Collection Time: 12/05/14  1:23 AM  Result Value Ref Range Status   MRSA, PCR NEGATIVE NEGATIVE Final   Staphylococcus aureus NEGATIVE NEGATIVE Final    Comment:        The Xpert SA Assay (FDA approved for NASAL specimens in patients over 80 years of age), is one component of a comprehensive surveillance program.  Test performance has been validated by [facility] for patients greater than or equal to 32 year old. It is not intended to diagnose infection nor to guide or monitor treatment.   Culture, bal-quantitative     Status: None   Collection Time: 12/05/14 10:09 AM  Result Value Ref Range Status   Specimen Description BRONCHIAL ALVEOLAR LAVAGE  Final   Special Requests BRONCHIAL WASHINGS  Final   Gram Stain   Final    NO WBC SEEN NO SQUAMOUS EPITHELIAL CELLS SEEN NO ORGANISMS SEEN Performed at [facility]    Colony Count NO GROWTH Performed at [facility]   Final   Culture   Final    NO GROWTH 2 DAYS Performed at [facility]    Report Status 12/07/2014 FINAL  Final  Fungus Culture with Smear     Status: None (Preliminary result)   Collection Time: 12/05/14 10:09 AM  Result Value Ref Range Status   Specimen Description BRONCHIAL ALVEOLAR LAVAGE  Final   Special Requests BRONCHIAL WASHINGS  Final   Fungal Smear   Final  NO YEAST OR FUNGAL ELEMENTS SEEN Performed at [facility]    Culture   Final    CULTURE IN PROGRESS FOR FOUR WEEKS Performed at [facility]    Report Status PENDING  Incomplete  AFB culture with smear     Status: None (Preliminary result)   Collection Time: 12/05/14 10:09 AM  Result Value Ref Range Status   Specimen Description BRONCHIAL ALVEOLAR LAVAGE  Final   Special Requests BRONCHIAL WASHINGS  Final   Acid Fast Smear   Final    NO ACID FAST BACILLI SEEN Performed at  [facility]    Culture   Final    CULTURE WILL BE EXAMINED FOR 6 WEEKS BEFORE ISSUING A FINAL REPORT Performed at [facility]    Report Status PENDING  Incomplete  AFB culture with smear     Status: None (Preliminary result)   Collection Time: 12/05/14 10:42 AM  Result Value Ref Range Status   Specimen Description PLEURAL RIGHT FLUID  Final   Special Requests SPEC B  Final   Acid Fast Smear   Final    NO ACID FAST BACILLI SEEN Performed at [facility]    Culture   Final    CULTURE WILL BE EXAMINED FOR 6 WEEKS BEFORE ISSUING A FINAL REPORT Performed at [facility]    Report Status PENDING  Incomplete  Anaerobic culture     Status: None   Collection Time: 12/05/14 10:42 AM  Result Value Ref Range Status   Specimen Description PLEURAL RIGHT FLUID  Final   Special Requests SPEC D  Final   Gram Stain   Final    FEW WBC PRESENT, PREDOMINANTLY PMN NO SQUAMOUS EPITHELIAL CELLS SEEN FEW GRAM POSITIVE COCCI IN PAIRS    Culture NO ANAEROBES ISOLATED  Final   Report Status 12/11/2014 FINAL  Final  Body fluid culture     Status: None   Collection Time: 12/05/14 10:42 AM  Result Value Ref Range Status   Specimen Description PLEURAL  Final   Special Requests NONE  Final   Gram Stain   Final    DEGENERATE WBC PRESENT ON SLIDE FEW GRAM VARIABLE COCCI CRITICAL RESULT CALLED TO, READ BACK BY AND VERIFIED WITH: [place] RN 12/05/14 2050 [place] BY V WILKINS    Culture   Final    ABUNDANT MICROAEROPHILIC STREPTOCOCCI Standardized susceptibility testing for this organism is not available.    Report Status 12/09/2014 FINAL  Final  Tissue culture     Status: None   Collection Time: 12/05/14 10:51 AM  Result Value Ref Range Status   Specimen Description TISSUE  Final   Special Requests RIGHT PLEURAL PEEL CUP C PT ON VANC  Final   Gram Stain   Final    MODERATE WBC PRESENT, PREDOMINANTLY PMN NO SQUAMOUS EPITHELIAL CELLS SEEN ABUNDANT GRAM  POSITIVE COCCI IN PAIRS IN CHAINS Performed at [facility]    Culture   Final    ABUNDANT MICROAEROPHILIC STREPTOCOCCI Note: Standardized susceptibility testing for this organism is not available. Performed at [facility]    Report Status 12/08/2014 FINAL  Final  Fungus Culture with Smear     Status: None (Preliminary result)   Collection Time: 12/05/14 10:51 AM  Result Value Ref Range Status   Specimen Description TISSUE  Final   Special Requests RIGHT PLEURAL PEEL CUP C PT ON VANC  Final   Fungal Smear   Final    NO YEAST  OR FUNGAL ELEMENTS SEEN Performed at [facility]    Culture   Final    CULTURE IN PROGRESS FOR FOUR WEEKS Performed at [facility]    Report Status PENDING  Incomplete  AFB culture with smear     Status: None (Preliminary result)   Collection Time: 12/05/14 10:51 AM  Result Value Ref Range Status   Specimen Description TISSUE  Final   Special Requests RIGHT PLEURAL PEEL CUP C PT ON VANC  Final   Acid Fast Smear   Final    NO ACID FAST BACILLI SEEN Performed at [facility]    Culture   Final    CULTURE WILL BE EXAMINED FOR 6 WEEKS BEFORE ISSUING A FINAL REPORT Performed at [facility]    Report Status PENDING  Incomplete    RADIOLOGY STUDIES/RESULTS: Dg Chest 1 View  12/04/2014   CLINICAL DATA:  Post right thoracentesis  EXAM: CHEST  1 VIEW  COMPARISON:  12/02/2014  FINDINGS: Diffuse right lung airspace disease again noted, slightly improved. Decreasing right effusion following thoracentesis. No pneumothorax. Heart is borderline in size. Nodular density projecting over the left lung base is felt represent nipple shadow. Left lung is clear. No acute bony abnormality.  IMPRESSION: Decreasing right effusion and diffuse right lung airspace disease. No pneumothorax.   Electronically Signed   By: Rolm Baptise M.D.   On: 12/04/2014 13:10   Dg Chest 2 View  12/02/2014   CLINICAL DATA:  Altered level of  consciousness.  EXAM: CHEST  2 VIEW  COMPARISON:  Sep 04, 2012.  FINDINGS: The heart size and mediastinal contours are within normal limits. No pneumothorax is noted. Left lung is clear. There is now noted diffuse opacification of the right lower lobe most consistent with pneumonia with associated pleural effusion. The visualized skeletal structures are unremarkable.  IMPRESSION: Diffuse opacification of the right lower lobe most consistent with pneumonia with associated pleural effusion. Follow-up radiographs are recommended.   Electronically Signed   By: Marijo Conception, M.D.   On: 12/02/2014 10:57   Dg Abd 1 View  12/12/2014   CLINICAL DATA:  Feeding tube placement  EXAM: ABDOMEN - 1 VIEW  COMPARISON:  12/10/2014  FINDINGS: Tip of feeding tube projects over proximal stomach.  Air-filled upper normal caliber loops of small bowel.  Scattered colonic gas.  No evidence of bowel obstruction or dilatation.  Scattered degenerative disc disease changes lumbar spine.  Foley catheter in urinary bladder.  IMPRESSION: Tip of feeding tube projects over proximal stomach.   Electronically Signed   By: Lavonia Dana M.D.   On: 12/12/2014 10:05   Ct Chest Wo Contrast  12/04/2014   CLINICAL DATA:  Right hemithorax opacification, assess hemothorax, effusion and loculation. Review of electronic records demonstrates history of productive cough and shortness of breath for 3 weeks. Recent falls.  EXAM: CT CHEST WITHOUT CONTRAST  TECHNIQUE: Multidetector CT imaging of the chest was performed following the standard protocol without IV contrast.  COMPARISON:  Chest radiograph 12/02/2014  FINDINGS: Large volume of loculated right pleural fluid. Within the right lower hemithorax are multiple foci of air within the large volume pleural fluid raising concern for empyema. There is mass effect on the adjacent bronchi, with compressive atelectasis of the dependent right upper lobe and consolidation in the right middle lobe with air  bronchograms. The origin of the right lower lobe bronchus is not well-defined, and no aerated right lower lobe lung is confidently identified. Pleural  fluid is heterogeneous in density.  There is a small left pleural effusion that appears simple fluid in density. Adjacent compressive atelectasis in the lower lobe. The left upper lobe is clear.  Heart at the upper limits of normal in size. There are dense coronary artery calcifications. No definite pericardial fluid. Prominent small superior mediastinal lymph nodes measuring 9 mm short axis dimension. Limited assessment for hilar adenopathy. Mild atherosclerosis of normal caliber thoracic aorta.  No definite acute abnormality in the included upper abdomen, perinephric stranding is partially included.  There are no acute rib fractures. Remote nondisplaced lower right rib fractures with well-formed callus. Age related degenerative change throughout spine. No blastic or destructive lytic osseous lesions.  IMPRESSION: 1. Large partially loculated heterogeneous right pleural effusion. In the lower portion are multiple foci of air. Findings are most concerning for empyema. There is consolidation or atelectasis of the entire right lower lobe, consolidation with air bronchograms in the right middle lobe, and compressive atelectasis of the adjacent right upper lobe. Hemothorax is felt less likely, given the right lower rib fractures appear remote. 2. Small left pleural effusion with adjacent compressive atelectasis. 3. Dense coronary artery calcifications.   Electronically Signed   By: Jeb Levering M.D.   On: 12/04/2014 02:42   Dg Chest Port 1 View  12/11/2014   CLINICAL DATA:  69 year old male with history of empyema.  EXAM: PORTABLE CHEST - 1 VIEW  COMPARISON:  Chest x-ray 12/10/2014.  FINDINGS: Previously noted right-sided chest tube has been removed. No appreciable right pneumothorax. Right IJ catheter with tip terminating in the distal superior vena cava. Feeding  tube extending into the upper abdomen, with tip below the lower margin of the image. Lung volumes are slightly low. Bibasilar opacities may reflect areas of atelectasis and/or consolidation. Small to moderate bilateral pleural effusions. Cephalization of pulmonary vasculature. Mild cardiomegaly. Upper mediastinal contours are slightly distorted by patient positioning.  IMPRESSION: 1. Support apparatus, as above. 2. No definite right pneumothorax following right-sided chest tube removal. 3. Bibasilar opacities may reflect areas of atelectasis and/or consolidation, with superimposed small to moderate bilateral pleural effusions. 4. Cardiomegaly with pulmonary venous congestion, but no frank pulmonary edema.   Electronically Signed   By: Vinnie Langton M.D.   On: 12/11/2014 08:36   Dg Chest Port 1 View  12/10/2014   CLINICAL DATA:  69 year old male status post VATS for empyema. Initial encounter.  EXAM: PORTABLE CHEST - 1 VIEW  COMPARISON:  12/09/2014 and earlier.  FINDINGS: Portable AP semi upright view at 0710 hrs. Enteric feeding tube is been placed, tip not included. 1 of the 2 right chest tubes has been removed. Single residual right side chest tube and right IJ central line appear stable. Stable lung volumes. Stable cardiac size and mediastinal contours. Residual peripheral and basilar opacity in the right lung is stable. No pneumothorax. Left lower lobe atelectasis.  IMPRESSION: 1. One right chest tube removed, 1 remains. No pneumothorax identified. 2. Feeding tube placed, tip not included. 3. Stable right lung ventilation. Left lung remarkable for lower lobe atelectasis.   Electronically Signed   By: Genevie Ann M.D.   On: 12/10/2014 07:49   Dg Chest Port 1 View  12/09/2014   CLINICAL DATA:  Post VATS, empyema, history prostate cancer, hypertension, diabetes mellitus  EXAM: PORTABLE CHEST - 1 VIEW  COMPARISON:  Portable exam 0647 hours compared 12/08/2014  FINDINGS: RIGHT jugular central venous catheter with  tip projecting over SVC.  Pair of RIGHT thoracostomy tubes  unchanged.  Mild enlargement of cardiac silhouette.  Stable mediastinal contours and pulmonary vascularity.  Mild residual pleural-based opacity in the RIGHT hemi thorax with persisting consolidation in RIGHT lower lobe.  LEFT lung clear.  No pneumothorax.  IMPRESSION: Stable RIGHT thoracostomy tubes with persistent RIGHT lower lobe consolidation and mild residual pleural-based opacity.   Electronically Signed   By: Lavonia Dana M.D.   On: 12/09/2014 07:55   Dg Chest Port 1 View  12/08/2014   CLINICAL DATA:  Empyema  EXAM: PORTABLE CHEST - 1 VIEW  COMPARISON:  12/07/2014  FINDINGS: Three chest tubes on the right are unchanged in position. No pneumothorax. Right lower lobe infiltrate. Minimal right effusion.  Left lower lobe atelectasis has progressed.  Negative for heart failure. Right jugular catheter tip in the SVC. Endotracheal tube and NG tubes removed.  IMPRESSION: Three chest tubes remain on the right without pneumothorax. Right lower lobe consolidation remains.  Increase in left lower lobe atelectasis.  Endotracheal tube removed in the interval.   Electronically Signed   By: Franchot Gallo M.D.   On: 12/08/2014 08:03   Dg Chest Port 1 View  12/07/2014   CLINICAL DATA:  Empyema.  EXAM: PORTABLE CHEST - 1 VIEW  COMPARISON:  12/06/2014  FINDINGS: The endotracheal tube tip is just below the clavicular heads. The orogastric tube reaches the stomach at least. Right IJ central line, tip at the distal SVC.  3 right-sided thoracostomy tubes are in stable position. There is no definitive pneumothorax. Compared to preoperative study, pleural effusion remains decreased. Right basilar pneumonia is stable. Streaky left lower lobe opacity has slowly increased over the past 2 days, consistent with atelectasis. No pulmonary edema.  IMPRESSION: 1. Stable positioning of tubes and central line. 2. Recent right empyema drainage with no pneumothorax or increasing  pleural fluid. 3. Stable right basilar pneumonia and left basilar atelectasis.   Electronically Signed   By: Monte Fantasia M.D.   On: 12/07/2014 07:41   Portable Chest Xray  12/06/2014   CLINICAL DATA:  Hypoxia  EXAM: PORTABLE CHEST - 1 VIEW  COMPARISON:  December 05, 2014  FINDINGS: Endotracheal tube tip is 5.3 cm above the carina. Central catheter tip is in the superior cava. Nasogastric tube tip and side port are below the diaphragm. Chest tubes are again noted on the right, unchanged in position. There is no appreciable pneumothorax. There is slightly less airspace consolidation in the right lower lung zone compared to 1 day prior. There is a mild degree of effusion on the right, stable. The left lung is essentially clear. Heart size and pulmonary vascularity are normal. No adenopathy.  IMPRESSION: Tube and catheter positions as described without pneumothorax. Partial clearing of consolidation right base. No new opacity. Small right effusion. Left lung clear. No change in cardiac silhouette.   Electronically Signed   By: Lowella Grip III M.D.   On: 12/06/2014 07:42   Dg Chest Port 1 View  12/05/2014   CLINICAL DATA:  Acute respiratory failure, hypoxia.  EXAM: PORTABLE CHEST - 1 VIEW  COMPARISON:  12/05/2014  FINDINGS: Right chest tubes and remainder support devices remain in place, unchanged. No pneumothorax. Interval placement of the NG tube into the stomach. Patchy right lung airspace disease slightly worsened since prior study. No confluent opacity on the left. Heart is normal size.  IMPRESSION: Worsening right lung airspace disease. Right chest tubes remain in place without pneumothorax.   Electronically Signed   By: Rolm Baptise M.D.  On: 12/05/2014 16:36   Dg Chest Port 1 View  12/05/2014   CLINICAL DATA:  Pneumothorax.  EXAM: PORTABLE CHEST - 1 VIEW  COMPARISON:  Radiograph 12/04/2014, CT 12/04/2014, chest radiograph 12/02/2014  FINDINGS: 3 RIGHT chest tubes noted. There is interval decrease  in volume of the pleural fluid in the RIGHT hemi thorax. No appreciable pneumothorax identified. RIGHT central venous line is in place with tip in the distal SVC. Endotracheal tube is 3.8 cm from carina. LEFT lung is clear.  IMPRESSION: 1. Decrease in pleural fluid in the RIGHT hemi thorax. 2. Three right chest tubes in place without pneumothorax. 3. Endotracheal tube and central venous line appear in good position.   Electronically Signed   By: Suzy Bouchard M.D.   On: 12/05/2014 14:09   Dg Abd Portable 1v  12/11/2014   CLINICAL DATA:  Nasogastric tube placement.  Initial encounter.  EXAM: PORTABLE ABDOMEN - 1 VIEW  COMPARISON:  Abdominal radiograph performed 12/09/2014, and chest radiograph performed earlier today at 7:10 a.m.  FINDINGS: The patient's enteric tube is noted ending overlying the third segment of the duodenum, at appropriate position.  The visualized bowel gas pattern is grossly unremarkable, with scattered air filled loops of small and large bowel seen.  A small right pleural effusion is noted, with right basilar airspace opacification. No free intra-abdominal air is identified, though evaluation for free air is limited on a single supine view.  Mild degenerative change is noted at the lower lumbar spine.  IMPRESSION: 1. Enteric tube noted ending overlying the third segment of the duodenum, at appropriate position. 2. Small right pleural effusion, with right basilar airspace opacification, mildly more prominent than on the prior study.   Electronically Signed   By: Garald Balding M.D.   On: 12/11/2014 02:01   Dg Abd Portable 1v  12/09/2014   CLINICAL DATA:  Feeding tube placement .  EXAM: PORTABLE ABDOMEN - 1 VIEW  COMPARISON:  None.  FINDINGS: Feeding tube noted with its tip projected over the descending portion of the duodenum. No gastric distention. Degenerative changes lumbar spine. Vascular calcification.  IMPRESSION: Feeding tube noted with tip projected over the descending portion of  the duodenum. No gastric distention noted.   Electronically Signed   By: [place]   On: 12/09/2014 13:09   Dg Swallowing Func-speech Pathology  12/04/2014    Objective Swallowing Evaluation:    Patient Details  Name: THOAMS SIEFERT MRN: [id number] Date of Birth: [birth year]  Today's Date: 12/04/2014 Time: SLP Start Time (ACUTE ONLY): 1235-SLP Stop Time (ACUTE ONLY): 1258 SLP Time Calculation (min) (ACUTE ONLY): 23 min  Past Medical History:  Past Medical History  Diagnosis Date  . BPH (benign prostatic hypertrophy)   . Prostate cancer     implants   . Vitamin D deficiency     takes Vit D daily  . History of ETT 1999    Dr, Caleen Essex  . Anxiety     takes Valium daily as needed  . Hyperlipidemia     takes Simvastatin nightly  . Hypertension     takes Quinapril and Procardia daily  . Headache(784.0)     occasionally  . Peripheral neuropathy   . Diabetes mellitus, type 2     but doesn't take any meds for it;diet controlled and exercise  . 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 Pneumonia 1960's?  . CAP (community acquired pneumonia) 12/02/2014   Past Surgical History:  Past Surgical History  Procedure Laterality  Date  . Foot surgery  May 2002    Dr. Sharol Given   . Left foot casted 4-5 months    . Bilateral  eye laser surgery Bilateral 2000    "related to diabetes"  . Charcot foot left Left 12/15/01  . Laser eye surgery right  12/2002  . Eye surgery    . Tonsillectomy    . I&d extremity Left 09/05/2012    Procedure: IRRIGATION AND DEBRIDEMENT EXTREMITY;  Surgeon: Newt Minion, MD;  Location: [facility];  Service: Orthopedics;  Laterality: Left;   Excision Charcot Collapse Left Foot and Base 5th Metatarsal, Antibiotic  Beads  . Esophagogastroduodenoscopy      at least 39yrs ago  . 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 male with PMH of  Diabetes  type 2 with Charcot foot supposedly diet-controlled, hypertension,  generalized anxiety disorder, history of atrial fibrillation not on  anticoagulation presumably secondary to falls. Patient presents to the  hospital with 3 weeks of worsening productive cough with purulent sputum.  Guaifenesin has been helpful, but has not resolved his cough. He also has  been having increased weakness with some mild dyspnea on exertion which is  improved with rest. Also noted concerns of very dry mouth and poor oral  hygiene. CXR 7/28 revealed diffuse opacification of RLL consistent with  PNA with associated pleural effusion. Bedside swallow eval ordered to aide  in ruling out aspiration.   No Data Recorded  Assessment / Plan / Recommendation CHL IP CLINICAL IMPRESSIONS 12/04/2014  Therapy Diagnosis Mild oral phase dysphagia;Moderate pharyngeal phase  dysphagia;Mild cervical esophageal phase dysphagia  Clinical Impression Pt currently demonstrating a mild oral/ moderate  pharyngeal dysphagia that appears sensorimotor in nature. Delay in swallow  initiation accompanied by reduced airway protection (reduced base of  tongue retraction and minimal epiglottic inversion) resulted in  penetration during the swallow x1 of thin liquids. Reduced epiglottic  inversion/ base of tongue retraction also resulted in moderate-severe  amounts of vallecular residuals across consistencies which led to  penetration post-swallow x1 of thin liquids- pt had a weak throat clear  response and cleared remaining penetrates with cued cough. Pt is not able  to control sip size and large sip of nectar thick liquids resulted in  significant residuals in vallecula, about half of which were cleared with  multiple swallows and cough/ throat clears (cued- pt did not sense).  Nectar thick liquids by teaspoon accompanied by chin tuck reduced  residuals and controlled sip size. Minimal backflow to cervical esophagus  observed across consistencies. Recommend  initiating dysphagia 2 diet,  nectar thick liquids by teaspoon, meds crushed in puree, full supervision  to cue pt to tuck chin, swallow 2x with chin down. Also have pt sit  upright 30 minutes after meal. Pt was able to follow instructions for  strategies during MBS with a visual cue- hopeful that this will continue  at bedside as well but decreased cognitive status continues to put pt at  increased risk of aspiration. SLP will continue to follow closely for diet  tolerance/ consider advancement or repeat MBS as cognitive status  improves.      CHL IP TREATMENT RECOMMENDATION 12/04/2014  Treatment Recommendations Therapy as outlined in treatment plan below  CHL IP DIET RECOMMENDATION 12/04/2014  SLP Diet Recommendations Dysphagia 2 (Fine chop);Nectar  Liquid Administration via (None)  Medication Administration Crushed with puree  Compensations Slow rate;Small sips/bites;Multiple dry swallows after each  bite/sip;Clear throat intermittently;Chin tuck  Postural Changes and/or Swallow Maneuvers (None)     CHL IP OTHER RECOMMENDATIONS 12/04/2014  Recommended Consults (None)  Oral Care Recommendations Oral care BID  Other Recommendations Order thickener from pharmacy;Clarify dietary  restrictions     No flowsheet data found.   CHL IP FREQUENCY AND DURATION 12/04/2014  Speech Therapy Frequency (ACUTE ONLY) min 2x/week  Treatment Duration 2 weeks     Pertinent Vitals/Pain none    SLP Swallow Goals No flowsheet data found.  No flowsheet data found.    CHL IP REASON FOR REFERRAL 12/04/2014  Reason for Referral Objectively evaluate swallowing function     CHL IP ORAL PHASE 12/04/2014  Lips (None)  Tongue (None)  Mucous membranes (None)  Nutritional status (None)  Other (None)  Oxygen therapy (None)  Oral Phase Impaired  Oral - Pudding Teaspoon (None)  Oral - Pudding Cup (None)  Oral - Honey Teaspoon (None)  Oral - Honey Cup (None)  Oral - Honey Syringe (None)  Oral - Nectar Teaspoon (None)  Oral - Nectar Cup (None)  Oral -  Nectar Straw (None)  Oral - Nectar Syringe (None)  Oral - Ice Chips (None)  Oral - Thin Teaspoon (None)  Oral - Thin Cup (None)  Oral - Thin Straw (None)  Oral - Thin Syringe (None)  Oral - Puree (None)  Oral - Mechanical Soft (None)  Oral - Regular (None)  Oral - Multi-consistency (None)  Oral - Pill (None)  Oral Phase - Comment (None)      CHL IP PHARYNGEAL PHASE 12/04/2014  Pharyngeal Phase Impaired  Pharyngeal - Pudding Teaspoon (None)  Penetration/Aspiration details (pudding teaspoon) (None)  Pharyngeal - Pudding Cup (None)  Penetration/Aspiration details (pudding cup) (None)  Pharyngeal - Honey Teaspoon (None)  Penetration/Aspiration details (honey teaspoon) (None)  Pharyngeal - Honey Cup (None)  Penetration/Aspiration details (honey cup) (None)  Pharyngeal - Honey Syringe (None)  Penetration/Aspiration details (honey syringe) (None)  Pharyngeal - Nectar Teaspoon (None)  Penetration/Aspiration details (nectar teaspoon) (None)  Pharyngeal - Nectar Cup (None)  Penetration/Aspiration details (nectar cup) (None)  Pharyngeal - Nectar Straw (None)  Penetration/Aspiration details (nectar straw) (None)  Pharyngeal - Nectar Syringe (None)  Penetration/Aspiration details (nectar syringe) (None)  Pharyngeal - Ice Chips (None)  Penetration/Aspiration details (ice chips) (None)  Pharyngeal - Thin Teaspoon (None)  Penetration/Aspiration details (thin teaspoon) (None)  Pharyngeal - Thin Cup (None)  Penetration/Aspiration details (thin cup) (None)  Pharyngeal - Thin Straw (None)  Penetration/Aspiration details (thin straw) (None)  Pharyngeal - Thin Syringe (None)  Penetration/Aspiration details (thin syringe') (None)  Pharyngeal - Puree (None)  Penetration/Aspiration details (puree) (None)  Pharyngeal - Mechanical Soft (None)  Penetration/Aspiration details (mechanical soft) (None)  Pharyngeal - Regular (None)  Penetration/Aspiration details (regular) (None)  Pharyngeal - Multi-consistency (None)  Penetration/Aspiration  details (multi-consistency) (None)  Pharyngeal - Pill (None)  Penetration/Aspiration details (pill) (None)  Pharyngeal Comment (None)      CHL IP CERVICAL ESOPHAGEAL PHASE 12/04/2014  Cervical Esophageal Phase Impaired  Pudding Teaspoon (None)  Pudding Cup (None)  Honey Teaspoon (None)  Honey Cup (None)  Honey Straw (None)  Nectar Teaspoon Reduced cricopharyngeal relaxation;Esophageal backflow  into cervical esophagus  Nectar Cup Reduced cricopharyngeal relaxation;Esophageal backflow into  cervical esophagus  Nectar Straw (None)  Nectar Sippy Cup (None)  Thin Teaspoon (None)  Thin Cup Reduced cricopharyngeal relaxation;Esophageal backflow into  cervical esophagus  Thin Straw (None)  Thin Sippy Cup (None)  Cervical Esophageal Comment (None)    No flowsheet data found.         Kern Reap, MA, CCC-SLP 12/04/2014, 1:55 PM [id number]    Thurnell Lose, MD  [facility] Pager:[phone]  If 7PM-7AM, please contact night-coverage [email or web address] Password [email or web address] 12/12/2014, 10:32 AM   LOS: 10 days

## 2014-12-13 LAB — CBC
HCT: 25.8 % — ABNORMAL LOW (ref 39.0–52.0)
Hemoglobin: 8.1 g/dL — ABNORMAL LOW (ref 13.0–17.0)
MCH: 28.2 pg (ref 26.0–34.0)
MCHC: 31.4 g/dL (ref 30.0–36.0)
MCV: 89.9 fL (ref 78.0–100.0)
Platelets: 428 10*3/uL — ABNORMAL HIGH (ref 150–400)
RBC: 2.87 MIL/uL — ABNORMAL LOW (ref 4.22–5.81)
RDW: 15.9 % — AB (ref 11.5–15.5)
WBC: 11.2 10*3/uL — ABNORMAL HIGH (ref 4.0–10.5)

## 2014-12-13 LAB — GLUCOSE, CAPILLARY
GLUCOSE-CAPILLARY: 151 mg/dL — AB (ref 65–99)
GLUCOSE-CAPILLARY: 181 mg/dL — AB (ref 65–99)
GLUCOSE-CAPILLARY: 184 mg/dL — AB (ref 65–99)
GLUCOSE-CAPILLARY: 195 mg/dL — AB (ref 65–99)
Glucose-Capillary: 201 mg/dL — ABNORMAL HIGH (ref 65–99)
Glucose-Capillary: 173 mg/dL — ABNORMAL HIGH (ref 65–99)

## 2014-12-13 LAB — BASIC METABOLIC PANEL
Anion gap: 6 (ref 5–15)
BUN: 8 mg/dL (ref 6–20)
CO2: 26 mmol/L (ref 22–32)
CREATININE: 0.93 mg/dL (ref 0.61–1.24)
Calcium: 7.3 mg/dL — ABNORMAL LOW (ref 8.9–10.3)
Chloride: 109 mmol/L (ref 101–111)
GFR calc Af Amer: >60 mL/min (ref >60)
GFR calc non Af Amer: >60 mL/min (ref >60)
Glucose, Bld: 203 mg/dL — ABNORMAL HIGH (ref 65–99)
POTASSIUM: 3.3 mmol/L — AB (ref 3.5–5.1)
SODIUM: 141 mmol/L (ref 135–145)

## 2014-12-13 LAB — MAGNESIUM: Magnesium: 1.4 mg/dL — ABNORMAL LOW (ref 1.7–2.4)

## 2014-12-13 MED ORDER — SODIUM CHLORIDE 0.9 % IV SOLN
INTRAVENOUS | Status: DC
Start: 2014-12-13 — End: 2014-12-14
  Administered 2014-12-13: 08:00:00 via INTRAVENOUS

## 2014-12-13 MED ORDER — MAGNESIUM SULFATE 2 GM/50ML IV SOLN
2.0000 g | Freq: Once | INTRAVENOUS | Status: AC
  Administered 2014-12-13: 2 g via INTRAVENOUS
  Filled 2014-12-13: qty 50

## 2014-12-13 MED ORDER — POTASSIUM CHLORIDE 10 MEQ/100ML IV SOLN
10.0000 meq | INTRAVENOUS | Status: AC
  Administered 2014-12-13 (×4): 10 meq via INTRAVENOUS
  Filled 2014-12-13 (×4): qty 100

## 2014-12-13 NOTE — Care Management Important Message (Signed)
Important Message  Patient Details  Name: Logan May MRN: [id number] Date of Birth: 12/24/1945   Medicare Important Message Given:  Yes-fourth notification given    Pricilla Handler 12/13/2014, 3:14 PM

## 2014-12-13 NOTE — Progress Notes (Signed)
Speech Language Pathology Treatment: Dysphagia  Patient Details Name: Logan May MRN: [id number] DOB: Jun 07, 1945 Today's Date: 12/13/2014 Time: 2707-8675 SLP Time Calculation (min) (ACUTE ONLY): 15 min  Assessment / Plan / Recommendation Clinical Impression  Dysphagia treatment provided today for PO trials/ consideration of MBS readiness. Prior to PO trials, pt with wet vocal quality- cued pt to cough which resulted in copious amounts of phlegm/ liquid into mouth which was suctioned- ? Reflux from tube feeds. Pt continues to be confused and unable to follow directions for swallow safety. Provided trials of puree and nectar thick consistencies. Swallow appeared timely today but with delayed cough and wet vocal quality following trials of both consistencies- likely penetration or aspiration of residuals. Pt continues to be at high risk of aspiration- recommend that pt remain NPO with meds via alternative means. Will continue to follow for PO trials/ MBS readiness.   HPI Other Pertinent Information: 69 year old male admitted 7/28 with complaints of productive cough and SOB x 3 weeks. Admitted 7/28 for R sided CAP with associated effusion on CXR. PCCM consulted for evaluation of effusion. 7/31 rt vats for empyema with VDRF and hypotesion. History of ETOH, pt has been confused and agitated. MBS on 7/30 recommended Dys 2 diet with nectar via teaspoon with a chin tuck. After this assessment pt was intubated for procedure from 7/31 to 8/3.    Pertinent Vitals Pain Assessment: No/denies pain  SLP Plan  Continue with current plan of care    Recommendations Diet recommendations: NPO Medication Administration: Via alternative means              Oral Care Recommendations: Oral care QID Plan: Continue with current plan of care    [place], Garielle Mroz K, [place], CCC-SLP 12/13/2014, 9:57 AM [phone]

## 2014-12-13 NOTE — Progress Notes (Signed)
PATIENT DETAILS Name: Logan May Age: 69 y.o. 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 PCCM was consulted, underwent thoracocentesis on 7/30-pleural fluid was consistent with an empyema. Subsequently cardiothoracic surgery was consulted, and a right sided VATS, drainage of empyema and visceral/pleural decortication was done on 7/31. Patient subsequently developed respiratory failure and required mechanical ventilation till 8/3. ICU course was complicated by development of atrial fibrillation with RVR requiring amiodarone infusion and severe delirium thought to be secondary to alcohol withdrawal. Upon clinical stability, patient was transferred to the hospitalist service on 8/6.  Subjective:  Is confused but denies any headache, no chest or abdominal pain. Is following basic commands. Denies any focal weakness.  Assessment/Plan:  Severe sepsis: secondary to right sided PNA and empyema. Sepsis pathophysiology has resolved following VATS/decortication. Blood culture neg, but tissue culture positive for microaerophilic streptococcus will taper down to Rocephin at this point.  Right sided Empyema: underwent VATS, drainage of empyema and visceral/pleural decortication on 7/31.Tissue culture 7/31 positive for Microaerophilic streptococci,  AFB culture/fungal culture negative so far. ABX adjusted based on cultures - Abx stop date 8/13  Acute Respiratory Failure: developed VDRF on 7/31 post VATS/decortication, successfully extubated on  8/2.  Acute Urinary Retention:underwent Flexible cytoscopy and passage of foley catheter by Dr Gaynelle Arabian on 7/31  Afib YNW:GNFAOZHY Amiodarone gtt for rate control, now on prn lopressor. Felt to be a poor candidate for long term anticoagulation-although has a CHADS2Vasc SCORE of  3. Stared on ASA  ETOH withdrawal with delirium and metabolic encephalopathy:required Precedex gtt while in [place] less agitated-still somewhat confused-continue Ativan per CIWA protocol. Counseled to quit alcohol.  Severe Dysphagia: PANDA in place-has hx of dysphagia prior to admit-now likely worsened by acute illness. Keep NPO, continue PANDA feeds, await further SLP follow up.  Enterococcal UTI: Seen on urine culture 7/29-this is pansensitive. Has been treated with Zosyn.  Left upper Ext swelling: stable Doppler  Anemia:likely secondary to  critical illness. Follow CBC and transfuse prn if HB<7  Hx of COPD with emphysema:continue bronchodilators-lungs clear  Dyslipidemia:continue Statin  Severe Deconditioning:PT on board-SNF on discharge  Type 2 DM: CBG's stable with SSI. A1C 6.8 on 7/29  CBG (last 3)   Recent Labs  12/12/14 2007 12/13/14 0006 12/13/14 0416  GLUCAP 139* 151* 195*    Hypokalemia and hypomagnesemia. Both replaced.     Disposition: Remain inpatient-SNF on discharge   Antimicrobial agents  See below  Anti-infectives    Start     Dose/Rate Route Frequency Ordered Stop   12/12/14 1600  cefTRIAXone (ROCEPHIN) 1 g in dextrose 5 % 50 mL IVPB     1 g 100 mL/hr over 30 Minutes Intravenous Every 24 hours 12/12/14 1451     12/12/14 1200  levofloxacin (LEVAQUIN) IVPB 750 mg  Status:  Discontinued     750 mg 100 mL/hr over 90 Minutes Intravenous Every 24 hours 12/12/14 1042 12/12/14 1451   12/10/14 2100  piperacillin-tazobactam (ZOSYN) IVPB 3.375 g  Status:  Discontinued     3.375 g  12.5 mL/hr over 240 Minutes Intravenous Every 8 hours 12/10/14 1432 12/12/14 1035    12/05/14 0900  vancomycin (VANCOCIN) IVPB 1000 mg/200 mL premix  Status:  Discontinued     1,000 mg 200 mL/hr over 60 Minutes Intravenous Every 12 hours 12/04/14 2003 12/09/14 0736   12/05/14 0600  vancomycin (VANCOCIN) IVPB 1000 mg/200 mL premix  Status:  Discontinued     1,000 mg 200 mL/hr over 60 Minutes Intravenous To Surgery 12/04/14 1604 12/04/14 2000   12/04/14 2100  piperacillin-tazobactam (ZOSYN) IVPB 3.375 g  Status:  Discontinued     3.375 g 12.5 mL/hr over 240 Minutes Intravenous Every 8 hours 12/04/14 2003 12/10/14 1432   12/04/14 2100  vancomycin (VANCOCIN) 1,500 mg in sodium chloride 0.9 % 500 mL IVPB     1,500 mg 250 mL/hr over 120 Minutes Intravenous  Once 12/04/14 2003 12/04/14 2348   12/03/14 1330  levofloxacin (LEVAQUIN) IVPB 750 mg  Status:  Discontinued     750 mg 100 mL/hr over 90 Minutes Intravenous Every 24 hours 12/02/14 1514 12/04/14 1951   12/02/14 1130  cefTRIAXone (ROCEPHIN) 1 g in dextrose 5 % 50 mL IVPB     1 g 100 mL/hr over 30 Minutes Intravenous  Once 12/02/14 1118 12/02/14 1246   12/02/14 1130  azithromycin (ZITHROMAX) 500 mg in dextrose 5 % 250 mL IVPB     500 mg 250 mL/hr over 60 Minutes Intravenous  Once 12/02/14 1118 12/02/14 1429      DVT Prophylaxis: Prophylactic Lovenox   Code Status: Full code   Family Communication Wife bedside 12-12-14  Procedures: 7/30>>Thoracocentesis 7/31>>VATS, drainage of empyema and visceral/pleural decortication(Dr Hendrickson) 7/31>> Flexible cytoscopy and passage of foley catheter (Dr Gaynelle Arabian) 7/31>>8/3 ETT 12/12/2014 bilateral upper extremity venous duplex - no DVT SVT  CONSULTS:   pulmonary/intensive care, urology and CTVS  Time spent 40 minutes-Greater than 50% of this time was spent in counseling, explanation of diagnosis, planning of further management, and coordination of care.  MEDICATIONS: Scheduled Meds: . acidophilus  2 capsule Oral Daily  . aspirin EC  81 mg Oral Daily  .  atorvastatin  20 mg Oral q1800  . bisacodyl  10 mg Oral Daily  . cefTRIAXone (ROCEPHIN)  IV  1 g Intravenous Q24H  . cholecalciferol  2,000 Units Oral Once per day on Mon Tue Wed Thu Fri  . cholecalciferol  4,000 Units Oral Once per day on Sun Sat  . enoxaparin (LOVENOX) injection  40 mg Subcutaneous Q24H  . folic acid  1 mg Intravenous Daily  . insulin aspart  0-15 Units Subcutaneous 6 times per day  . ipratropium-albuterol  3 mL Nebulization TID  . magnesium sulfate 1 - 4 g bolus IVPB  2 g Intravenous Once  . metoprolol tartrate  25 mg Oral BID  . potassium chloride  10 mEq Intravenous Q1 Hr x 4  . senna-docusate  1 tablet Oral QHS  . thiamine IV  100 mg Intravenous Daily   Continuous Infusions: . sodium chloride 10 mL/hr at 12/13/14 0817  . feeding supplement (VITAL AF 1.2 CAL) 1,000 mL (12/12/14 1644)   PRN Meds:.acetaminophen, albuterol, fentaNYL (SUBLIMAZE) injection, Gerhardt's butt cream, ipratropium-albuterol, LORazepam, metoprolol, ondansetron (ZOFRAN) IV, sodium chloride    PHYSICAL EXAM: Vital signs in last 24 hours: Filed Vitals:   12/12/14 2102 12/13/14 0500 12/13/14 0521 12/13/14 0839  BP: 139/79  158/83   Pulse: 112  125 113  Temp: 99.5 F (37.5 C)  99.1 F (37.3 C)  TempSrc: Oral  Oral   Resp: 18  18 18   Height:      Weight: 85.8 kg (189 lb 2.5 oz) 85.6 kg (188 lb 11.4 oz)    SpO2: 97%  93% 96%    Weight change: -1.4 kg (-3 lb 1.4 oz) Filed Weights   12/11/14 2142 12/12/14 2102 12/13/14 0500  Weight: 87.2 kg (192 lb 3.9 oz) 85.8 kg (189 lb 2.5 oz) 85.6 kg (188 lb 11.4 oz)   Body mass index is 26.33 kg/(m^2).   Gen Exam: Awake but confused-however follows some commands. Panda tube in place Neck: Supple, No JVD.  Chest: B/L Clear anteriorly CVS: S1 S2 Regular, no murmurs.  Abdomen: soft, BS +, non tender Extremities: no edema, lower extremities warm to touch. Neurologic: Non Focal-but with gen weakness  Intake/Output from previous  day:  Intake/Output Summary (Last 24 hours) at 12/13/14 1004 Last data filed at 12/13/14 0622  Gross per 24 hour  Intake    669 ml  Output   2300 ml  Net  -1631 ml     LAB RESULTS: CBC  Recent Labs Lab 12/08/14 0507 12/09/14 0409 12/10/14 0416 12/11/14 0515 12/13/14 0458  WBC 11.0* 8.6 7.9 9.9 11.2*  HGB 8.3* 8.0* 8.0* 8.4* 8.1*  HCT 26.1* 24.9* 25.2* 26.5* 25.8*  PLT 331 366 353 429* 428*  MCV 89.4 88.6 89.4 89.2 89.9  MCH 28.4 28.5 28.4 28.3 28.2  MCHC 31.8 32.1 31.7 31.7 31.4  RDW 15.0 15.2 15.7* 15.7* 15.9*    Chemistries   Recent Labs Lab 12/08/14 0507 12/09/14 0409 12/10/14 0416 12/10/14 2225 12/11/14 0515 12/13/14 0458  NA 139 139 141  --  143 141  K 3.7 3.3* 3.6  --  3.7 3.3*  CL 110 109 111  --  115* 109  CO2 22 22 22   --  23 26  GLUCOSE 129* 143* 174*  --  167* 203*  BUN 12 6 6   --  8 8  CREATININE 1.02 1.03 0.99  --  0.98 0.93  CALCIUM 7.1* 7.1* 7.1*  --  7.0* 7.3*  MG  --   --  1.3* 2.4  --  1.4*    CBG:  Recent Labs Lab 12/12/14 1218 12/12/14 1557 12/12/14 2007 12/13/14 0006 12/13/14 0416  GLUCAP 157* 158* 139* 151* 195*    GFR Estimated Creatinine Clearance: 78.7 mL/min (by C-G formula based on Cr of 0.93).  Coagulation profile No results for input(s): INR, PROTIME in the last 168 hours.  Cardiac Enzymes No results for input(s): CKMB, TROPONINI, MYOGLOBIN in the last 168 hours.  Invalid input(s): CK  Invalid input(s): POCBNP No results for input(s): DDIMER in the last 72 hours. No results for input(s): HGBA1C in the last 72 hours. No results for input(s): CHOL, HDL, LDLCALC, TRIG, CHOLHDL, LDLDIRECT in the last 72 hours. No results for input(s): TSH, T4TOTAL, T3FREE, THYROIDAB in the last 72 hours.  Invalid input(s): FREET3 No results for input(s): VITAMINB12, FOLATE, FERRITIN, TIBC, IRON, RETICCTPCT in the last 72 hours. No results for input(s): LIPASE, AMYLASE in the last 72 hours.  Urine Studies No results for  input(s): UHGB, CRYS in the last 72 hours.  Invalid input(s): UACOL, UAPR, USPG, UPH, UTP, UGL, UKET, UBIL, UNIT, UROB, ULEU, UEPI, UWBC, URBC, UBAC, CAST, UCOM, BILUA  MICROBIOLOGY: Recent Results (from the past 240 hour(s))  Clostridium Difficile by PCR (not at [facility])     Status: None   Collection Time: 12/03/14 11:17 AM  Result Value  Ref Range Status   Toxigenic C Difficile by pcr NEGATIVE NEGATIVE Final  Culture, Urine     Status: None   Collection Time: 12/03/14  3:16 PM  Result Value Ref Range Status   Specimen Description URINE, RANDOM  Final   Special Requests NONE  Final   Culture 30,000 COLONIES/mL ENTEROCOCCUS SPECIES  Final   Report Status 12/06/2014 FINAL  Final   Organism ID, Bacteria ENTEROCOCCUS SPECIES  Final      Susceptibility   Enterococcus species - MIC*    AMPICILLIN <=2 SENSITIVE Sensitive     LEVOFLOXACIN 1 SENSITIVE Sensitive     NITROFURANTOIN <=16 SENSITIVE Sensitive     VANCOMYCIN <=0.5 SENSITIVE Sensitive     LINEZOLID 2 SENSITIVE Sensitive     * 30,000 COLONIES/mL ENTEROCOCCUS SPECIES  Gram stain     Status: None   Collection Time: 12/04/14 12:11 PM  Result Value Ref Range Status   Specimen Description PLEURAL  Final   Special Requests NONE  Final   Gram Stain   Final    ABUNDANT WBC PRESENT,BOTH PMN AND MONONUCLEAR FEW GRAM POSITIVE COCCI IN CHAINS IN CLUSTERS FEW GRAM NEGATIVE COCCI IN CHAINS IN CLUSTERS CRITICAL RESULT CALLED TO, READ BACK BY AND VERIFIED WITH: SCALCO,S RN 12/04/14 1927 [place] CONFIRMED BY V WILKINS    Report Status 12/04/2014 FINAL  Final  Fungus Culture with Smear     Status: None (Preliminary result)   Collection Time: 12/04/14 12:11 PM  Result Value Ref Range Status   Specimen Description PLEURAL  Final   Special Requests NONE  Final   Fungal Smear   Final    NO YEAST OR FUNGAL ELEMENTS SEEN Performed at [facility]    Culture   Final    CULTURE IN PROGRESS FOR FOUR WEEKS Performed at [facility]    Report Status PENDING  Incomplete  Culture, body fluid-bottle     Status: None   Collection Time: 12/04/14 12:11 PM  Result Value Ref Range Status   Specimen Description PLEURAL  Final   Special Requests NONE  Final   Culture NO GROWTH 5 DAYS  Final   Report Status 12/09/2014 FINAL  Final  Surgical pcr screen     Status: None   Collection Time: 12/05/14  1:23 AM  Result Value Ref Range Status   MRSA, PCR NEGATIVE NEGATIVE Final   Staphylococcus aureus NEGATIVE NEGATIVE Final    Comment:        The Xpert SA Assay (FDA approved for NASAL specimens in patients over 63 years of age), is one component of a comprehensive surveillance program.  Test performance has been validated by [facility] for patients greater than or equal to 61 year old. It is not intended to diagnose infection nor to guide or monitor treatment.   Culture, bal-quantitative     Status: None   Collection Time: 12/05/14 10:09 AM  Result Value Ref Range Status   Specimen Description BRONCHIAL ALVEOLAR LAVAGE  Final   Special Requests BRONCHIAL WASHINGS  Final   Gram Stain   Final    NO WBC SEEN NO SQUAMOUS EPITHELIAL CELLS SEEN NO ORGANISMS SEEN Performed at [facility] Count NO GROWTH Performed at [facility]   Final   Culture   Final    NO GROWTH 2 DAYS Performed at [facility]    Report Status 12/07/2014 FINAL  Final  Fungus Culture with Smear  Status: None (Preliminary result)   Collection Time: 12/05/14 10:09 AM  Result Value Ref Range Status   Specimen Description BRONCHIAL ALVEOLAR LAVAGE  Final   Special Requests BRONCHIAL WASHINGS  Final   Fungal Smear   Final    NO YEAST OR FUNGAL ELEMENTS SEEN Performed at [facility]    Culture   Final    CULTURE IN PROGRESS FOR FOUR WEEKS Performed at [facility]    Report Status PENDING  Incomplete  AFB culture with smear     Status: None (Preliminary result)   Collection  Time: 12/05/14 10:09 AM  Result Value Ref Range Status   Specimen Description BRONCHIAL ALVEOLAR LAVAGE  Final   Special Requests BRONCHIAL WASHINGS  Final   Acid Fast Smear   Final    NO ACID FAST BACILLI SEEN Performed at [facility]    Culture   Final    CULTURE WILL BE EXAMINED FOR 6 WEEKS BEFORE ISSUING A FINAL REPORT Performed at [facility]    Report Status PENDING  Incomplete  AFB culture with smear     Status: None (Preliminary result)   Collection Time: 12/05/14 10:42 AM  Result Value Ref Range Status   Specimen Description PLEURAL RIGHT FLUID  Final   Special Requests SPEC B  Final   Acid Fast Smear   Final    NO ACID FAST BACILLI SEEN Performed at [facility]    Culture   Final    CULTURE WILL BE EXAMINED FOR 6 WEEKS BEFORE ISSUING A FINAL REPORT Performed at [facility]    Report Status PENDING  Incomplete  Anaerobic culture     Status: None   Collection Time: 12/05/14 10:42 AM  Result Value Ref Range Status   Specimen Description PLEURAL RIGHT FLUID  Final   Special Requests SPEC D  Final   Gram Stain   Final    FEW WBC PRESENT, PREDOMINANTLY PMN NO SQUAMOUS EPITHELIAL CELLS SEEN FEW GRAM POSITIVE COCCI IN PAIRS    Culture NO ANAEROBES ISOLATED  Final   Report Status 12/11/2014 FINAL  Final  Body fluid culture     Status: None   Collection Time: 12/05/14 10:42 AM  Result Value Ref Range Status   Specimen Description PLEURAL  Final   Special Requests NONE  Final   Gram Stain   Final    DEGENERATE WBC PRESENT ON SLIDE FEW GRAM VARIABLE COCCI CRITICAL RESULT CALLED TO, READ BACK BY AND VERIFIED WITH: [place] RN 12/05/14 2050 [place] BY V WILKINS    Culture   Final    ABUNDANT MICROAEROPHILIC STREPTOCOCCI Standardized susceptibility testing for this organism is not available.    Report Status 12/09/2014 FINAL  Final  Tissue culture     Status: None   Collection Time: 12/05/14 10:51 AM  Result Value Ref  Range Status   Specimen Description TISSUE  Final   Special Requests RIGHT PLEURAL PEEL CUP C PT ON VANC  Final   Gram Stain   Final    MODERATE WBC PRESENT, PREDOMINANTLY PMN NO SQUAMOUS EPITHELIAL CELLS SEEN ABUNDANT GRAM POSITIVE COCCI IN PAIRS IN CHAINS Performed at [facility]    Culture   Final    ABUNDANT MICROAEROPHILIC STREPTOCOCCI Note: Standardized susceptibility testing for this organism is not available. Performed at [facility]    Report Status 12/08/2014 FINAL  Final  Fungus Culture with Smear     Status: None (Preliminary result)  Collection Time: 12/05/14 10:51 AM  Result Value Ref Range Status   Specimen Description TISSUE  Final   Special Requests RIGHT PLEURAL PEEL CUP C PT ON VANC  Final   Fungal Smear   Final    NO YEAST OR FUNGAL ELEMENTS SEEN Performed at [facility]    Culture   Final    CULTURE IN PROGRESS FOR FOUR WEEKS Performed at [facility]    Report Status PENDING  Incomplete  AFB culture with smear     Status: None (Preliminary result)   Collection Time: 12/05/14 10:51 AM  Result Value Ref Range Status   Specimen Description TISSUE  Final   Special Requests RIGHT PLEURAL PEEL CUP C PT ON VANC  Final   Acid Fast Smear   Final    NO ACID FAST BACILLI SEEN Performed at [facility]    Culture   Final    CULTURE WILL BE EXAMINED FOR 6 WEEKS BEFORE ISSUING A FINAL REPORT Performed at [facility]    Report Status PENDING  Incomplete    RADIOLOGY STUDIES/RESULTS: Dg Chest 1 View  12/04/2014   CLINICAL DATA:  Post right thoracentesis  EXAM: CHEST  1 VIEW  COMPARISON:  12/02/2014  FINDINGS: Diffuse right lung airspace disease again noted, slightly improved. Decreasing right effusion following thoracentesis. No pneumothorax. Heart is borderline in size. Nodular density projecting over the left lung base is felt represent nipple shadow. Left lung is clear. No acute bony abnormality.   IMPRESSION: Decreasing right effusion and diffuse right lung airspace disease. No pneumothorax.   Electronically Signed   By: Rolm Baptise M.D.   On: 12/04/2014 13:10   Dg Chest 2 View  12/02/2014   CLINICAL DATA:  Altered level of consciousness.  EXAM: CHEST  2 VIEW  COMPARISON:  Sep 04, 2012.  FINDINGS: The heart size and mediastinal contours are within normal limits. No pneumothorax is noted. Left lung is clear. There is now noted diffuse opacification of the right lower lobe most consistent with pneumonia with associated pleural effusion. The visualized skeletal structures are unremarkable.  IMPRESSION: Diffuse opacification of the right lower lobe most consistent with pneumonia with associated pleural effusion. Follow-up radiographs are recommended.   Electronically Signed   By: Marijo Conception, M.D.   On: 12/02/2014 10:57   Dg Abd 1 View  12/12/2014   CLINICAL DATA:  Feeding tube placement  EXAM: ABDOMEN - 1 VIEW  COMPARISON:  12/10/2014  FINDINGS: Tip of feeding tube projects over proximal stomach.  Air-filled upper normal caliber loops of small bowel.  Scattered colonic gas.  No evidence of bowel obstruction or dilatation.  Scattered degenerative disc disease changes lumbar spine.  Foley catheter in urinary bladder.  IMPRESSION: Tip of feeding tube projects over proximal stomach.   Electronically Signed   By: Lavonia Dana M.D.   On: 12/12/2014 10:05   Ct Chest Wo Contrast  12/04/2014   CLINICAL DATA:  Right hemithorax opacification, assess hemothorax, effusion and loculation. Review of electronic records demonstrates history of productive cough and shortness of breath for 3 weeks. Recent falls.  EXAM: CT CHEST WITHOUT CONTRAST  TECHNIQUE: Multidetector CT imaging of the chest was performed following the standard protocol without IV contrast.  COMPARISON:  Chest radiograph 12/02/2014  FINDINGS: Large volume of loculated right pleural fluid. Within the right lower hemithorax are multiple foci of air  within the large volume pleural fluid raising concern for empyema. There is mass effect on  the adjacent bronchi, with compressive atelectasis of the dependent right upper lobe and consolidation in the right middle lobe with air bronchograms. The origin of the right lower lobe bronchus is not well-defined, and no aerated right lower lobe lung is confidently identified. Pleural fluid is heterogeneous in density.  There is a small left pleural effusion that appears simple fluid in density. Adjacent compressive atelectasis in the lower lobe. The left upper lobe is clear.  Heart at the upper limits of normal in size. There are dense coronary artery calcifications. No definite pericardial fluid. Prominent small superior mediastinal lymph nodes measuring 9 mm short axis dimension. Limited assessment for hilar adenopathy. Mild atherosclerosis of normal caliber thoracic aorta.  No definite acute abnormality in the included upper abdomen, perinephric stranding is partially included.  There are no acute rib fractures. Remote nondisplaced lower right rib fractures with well-formed callus. Age related degenerative change throughout spine. No blastic or destructive lytic osseous lesions.  IMPRESSION: 1. Large partially loculated heterogeneous right pleural effusion. In the lower portion are multiple foci of air. Findings are most concerning for empyema. There is consolidation or atelectasis of the entire right lower lobe, consolidation with air bronchograms in the right middle lobe, and compressive atelectasis of the adjacent right upper lobe. Hemothorax is felt less likely, given the right lower rib fractures appear remote. 2. Small left pleural effusion with adjacent compressive atelectasis. 3. Dense coronary artery calcifications.   Electronically Signed   By: Jeb Levering M.D.   On: 12/04/2014 02:42   Dg Chest Port 1 View  12/11/2014   CLINICAL DATA:  69 year old male with history of empyema.  EXAM: PORTABLE CHEST - 1  VIEW  COMPARISON:  Chest x-ray 12/10/2014.  FINDINGS: Previously noted right-sided chest tube has been removed. No appreciable right pneumothorax. Right IJ catheter with tip terminating in the distal superior vena cava. Feeding tube extending into the upper abdomen, with tip below the lower margin of the image. Lung volumes are slightly low. Bibasilar opacities may reflect areas of atelectasis and/or consolidation. Small to moderate bilateral pleural effusions. Cephalization of pulmonary vasculature. Mild cardiomegaly. Upper mediastinal contours are slightly distorted by patient positioning.  IMPRESSION: 1. Support apparatus, as above. 2. No definite right pneumothorax following right-sided chest tube removal. 3. Bibasilar opacities may reflect areas of atelectasis and/or consolidation, with superimposed small to moderate bilateral pleural effusions. 4. Cardiomegaly with pulmonary venous congestion, but no frank pulmonary edema.   Electronically Signed   By: Vinnie Langton M.D.   On: 12/11/2014 08:36   Dg Chest Port 1 View  12/10/2014   CLINICAL DATA:  69 year old male status post VATS for empyema. Initial encounter.  EXAM: PORTABLE CHEST - 1 VIEW  COMPARISON:  12/09/2014 and earlier.  FINDINGS: Portable AP semi upright view at 0710 hrs. Enteric feeding tube is been placed, tip not included. 1 of the 2 right chest tubes has been removed. Single residual right side chest tube and right IJ central line appear stable. Stable lung volumes. Stable cardiac size and mediastinal contours. Residual peripheral and basilar opacity in the right lung is stable. No pneumothorax. Left lower lobe atelectasis.  IMPRESSION: 1. One right chest tube removed, 1 remains. No pneumothorax identified. 2. Feeding tube placed, tip not included. 3. Stable right lung ventilation. Left lung remarkable for lower lobe atelectasis.   Electronically Signed   By: Genevie Ann M.D.   On: 12/10/2014 07:49   Dg Chest Port 1 View  12/09/2014   CLINICAL  DATA:  Post VATS, empyema, history prostate cancer, hypertension, diabetes mellitus  EXAM: PORTABLE CHEST - 1 VIEW  COMPARISON:  Portable exam 3536 hours compared 12/08/2014  FINDINGS: RIGHT jugular central venous catheter with tip projecting over SVC.  Pair of RIGHT thoracostomy tubes unchanged.  Mild enlargement of cardiac silhouette.  Stable mediastinal contours and pulmonary vascularity.  Mild residual pleural-based opacity in the RIGHT hemi thorax with persisting consolidation in RIGHT lower lobe.  LEFT lung clear.  No pneumothorax.  IMPRESSION: Stable RIGHT thoracostomy tubes with persistent RIGHT lower lobe consolidation and mild residual pleural-based opacity.   Electronically Signed   By: Lavonia Dana M.D.   On: 12/09/2014 07:55   Dg Chest Port 1 View  12/08/2014   CLINICAL DATA:  Empyema  EXAM: PORTABLE CHEST - 1 VIEW  COMPARISON:  12/07/2014  FINDINGS: Three chest tubes on the right are unchanged in position. No pneumothorax. Right lower lobe infiltrate. Minimal right effusion.  Left lower lobe atelectasis has progressed.  Negative for heart failure. Right jugular catheter tip in the SVC. Endotracheal tube and NG tubes removed.  IMPRESSION: Three chest tubes remain on the right without pneumothorax. Right lower lobe consolidation remains.  Increase in left lower lobe atelectasis.  Endotracheal tube removed in the interval.   Electronically Signed   By: Franchot Gallo M.D.   On: 12/08/2014 08:03   Dg Chest Port 1 View  12/07/2014   CLINICAL DATA:  Empyema.  EXAM: PORTABLE CHEST - 1 VIEW  COMPARISON:  12/06/2014  FINDINGS: The endotracheal tube tip is just below the clavicular heads. The orogastric tube reaches the stomach at least. Right IJ central line, tip at the distal SVC.  3 right-sided thoracostomy tubes are in stable position. There is no definitive pneumothorax. Compared to preoperative study, pleural effusion remains decreased. Right basilar pneumonia is stable. Streaky left lower lobe opacity  has slowly increased over the past 2 days, consistent with atelectasis. No pulmonary edema.  IMPRESSION: 1. Stable positioning of tubes and central line. 2. Recent right empyema drainage with no pneumothorax or increasing pleural fluid. 3. Stable right basilar pneumonia and left basilar atelectasis.   Electronically Signed   By: Monte Fantasia M.D.   On: 12/07/2014 07:41   Portable Chest Xray  12/06/2014   CLINICAL DATA:  Hypoxia  EXAM: PORTABLE CHEST - 1 VIEW  COMPARISON:  December 05, 2014  FINDINGS: Endotracheal tube tip is 5.3 cm above the carina. Central catheter tip is in the superior cava. Nasogastric tube tip and side port are below the diaphragm. Chest tubes are again noted on the right, unchanged in position. There is no appreciable pneumothorax. There is slightly less airspace consolidation in the right lower lung zone compared to 1 day prior. There is a mild degree of effusion on the right, stable. The left lung is essentially clear. Heart size and pulmonary vascularity are normal. No adenopathy.  IMPRESSION: Tube and catheter positions as described without pneumothorax. Partial clearing of consolidation right base. No new opacity. Small right effusion. Left lung clear. No change in cardiac silhouette.   Electronically Signed   By: Lowella Grip III M.D.   On: 12/06/2014 07:42   Dg Chest Port 1 View  12/05/2014   CLINICAL DATA:  Acute respiratory failure, hypoxia.  EXAM: PORTABLE CHEST - 1 VIEW  COMPARISON:  12/05/2014  FINDINGS: Right chest tubes and remainder support devices remain in place, unchanged. No pneumothorax. Interval placement of the NG tube into the stomach. Patchy right lung airspace disease  slightly worsened since prior study. No confluent opacity on the left. Heart is normal size.  IMPRESSION: Worsening right lung airspace disease. Right chest tubes remain in place without pneumothorax.   Electronically Signed   By: Rolm Baptise M.D.   On: 12/05/2014 16:36   Dg Chest Port 1  View  12/05/2014   CLINICAL DATA:  Pneumothorax.  EXAM: PORTABLE CHEST - 1 VIEW  COMPARISON:  Radiograph 12/04/2014, CT 12/04/2014, chest radiograph 12/02/2014  FINDINGS: 3 RIGHT chest tubes noted. There is interval decrease in volume of the pleural fluid in the RIGHT hemi thorax. No appreciable pneumothorax identified. RIGHT central venous line is in place with tip in the distal SVC. Endotracheal tube is 3.8 cm from carina. LEFT lung is clear.  IMPRESSION: 1. Decrease in pleural fluid in the RIGHT hemi thorax. 2. Three right chest tubes in place without pneumothorax. 3. Endotracheal tube and central venous line appear in good position.   Electronically Signed   By: Suzy Bouchard M.D.   On: 12/05/2014 14:09   Dg Abd Portable 1v  12/11/2014   CLINICAL DATA:  Nasogastric tube placement.  Initial encounter.  EXAM: PORTABLE ABDOMEN - 1 VIEW  COMPARISON:  Abdominal radiograph performed 12/09/2014, and chest radiograph performed earlier today at 7:10 a.m.  FINDINGS: The patient's enteric tube is noted ending overlying the third segment of the duodenum, at appropriate position.  The visualized bowel gas pattern is grossly unremarkable, with scattered air filled loops of small and large bowel seen.  A small right pleural effusion is noted, with right basilar airspace opacification. No free intra-abdominal air is identified, though evaluation for free air is limited on a single supine view.  Mild degenerative change is noted at the lower lumbar spine.  IMPRESSION: 1. Enteric tube noted ending overlying the third segment of the duodenum, at appropriate position. 2. Small right pleural effusion, with right basilar airspace opacification, mildly more prominent than on the prior study.   Electronically Signed   By: Garald Balding M.D.   On: 12/11/2014 02:01   Dg Abd Portable 1v  12/09/2014   CLINICAL DATA:  Feeding tube placement .  EXAM: PORTABLE ABDOMEN - 1 VIEW  COMPARISON:  None.  FINDINGS: Feeding tube noted with  its tip projected over the descending portion of the duodenum. No gastric distention. Degenerative changes lumbar spine. Vascular calcification.  IMPRESSION: Feeding tube noted with tip projected over the descending portion of the duodenum. No gastric distention noted.   Electronically Signed   By: Sevier   On: 12/09/2014 13:09   Dg Swallowing Func-speech Pathology  12/04/2014    Objective Swallowing Evaluation:    Patient Details  Name: AIYDEN LAUDERBACK MRN: [id number] Date of Birth: 1945/09/15  Today's Date: 12/04/2014 Time: SLP Start Time (ACUTE ONLY): 1235-SLP Stop Time (ACUTE ONLY): 1258 SLP Time Calculation (min) (ACUTE ONLY): 23 min  Past Medical History:  Past Medical History  Diagnosis Date  . BPH (benign prostatic hypertrophy)   . Prostate cancer     implants   . Vitamin D deficiency     takes Vit D daily  . History of ETT 1999    Dr, Caleen Essex  . Anxiety     takes Valium daily as needed  . Hyperlipidemia     takes Simvastatin nightly  . Hypertension     takes Quinapril and Procardia daily  . Headache(784.0)     occasionally  . Peripheral neuropathy   . Diabetes mellitus, type 2  but doesn't take any meds for it;diet controlled and exercise  . 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 Pneumonia 1960's?  . CAP (community acquired pneumonia) 12/02/2014   Past Surgical History:  Past Surgical History  Procedure Laterality Date  . Foot surgery  May 2002    Dr. Sharol Given   . Left foot casted 4-5 months    . Bilateral  eye laser surgery Bilateral 2000    "related to diabetes"  . Charcot foot left Left 12/15/01  . Laser eye surgery right  12/2002  . Eye surgery    . Tonsillectomy    . I&d extremity Left 09/05/2012    Procedure: IRRIGATION AND DEBRIDEMENT EXTREMITY;  Surgeon: Newt Minion, MD;  Location: [place];  Service: Orthopedics;  Laterality: Left;   Excision Charcot Collapse Left Foot and Base 5th Metatarsal, Antibiotic  Beads  . Esophagogastroduodenoscopy      at least 34yrs ago  . 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 male with PMH of Diabetes  type 2 with Charcot foot supposedly diet-controlled, hypertension,  generalized anxiety disorder, history of atrial fibrillation not on  anticoagulation presumably secondary to falls. Patient presents to the  hospital with 3 weeks of worsening productive cough with purulent sputum.  Guaifenesin has been helpful, but has not resolved his cough. He also has  been having increased weakness with some mild dyspnea on exertion which is  improved with rest. Also noted concerns of very dry mouth and poor oral  hygiene. CXR 7/28 revealed diffuse opacification of RLL consistent with  PNA with associated pleural effusion. Bedside swallow eval ordered to aide  in ruling out aspiration.   No Data Recorded  Assessment / Plan / Recommendation CHL IP CLINICAL IMPRESSIONS 12/04/2014  Therapy Diagnosis Mild oral phase dysphagia;Moderate pharyngeal phase  dysphagia;Mild cervical esophageal phase dysphagia  Clinical Impression Pt currently demonstrating a mild oral/ moderate  pharyngeal dysphagia that appears sensorimotor in nature. Delay in swallow  initiation accompanied by reduced airway protection (reduced base of  tongue retraction and minimal epiglottic inversion) resulted in  penetration during the swallow x1 of thin liquids. Reduced epiglottic  inversion/ base of tongue retraction also resulted in moderate-severe  amounts of vallecular residuals across consistencies which led to  penetration post-swallow x1 of thin liquids- pt had a weak throat clear  response and cleared remaining penetrates with cued cough. Pt is not able  to control sip size and large sip of nectar thick liquids resulted in  significant residuals in vallecula, about half of which were cleared with  multiple swallows and cough/  throat clears (cued- pt did not sense).  Nectar thick liquids by teaspoon accompanied by chin tuck reduced  residuals and controlled sip size. Minimal backflow to cervical esophagus  observed across consistencies. Recommend initiating dysphagia 2 diet,  nectar thick liquids by teaspoon, meds crushed in puree, full supervision  to cue pt to tuck chin, swallow 2x with chin down. Also have pt sit  upright 30 minutes after meal. Pt was able to follow instructions for  strategies during MBS with a visual cue- hopeful that this will continue  at bedside as well but decreased cognitive status continues to put pt at  increased risk of aspiration. SLP will continue to  follow closely for diet  tolerance/ consider advancement or repeat MBS as cognitive status  improves.      CHL IP TREATMENT RECOMMENDATION 12/04/2014  Treatment Recommendations Therapy as outlined in treatment plan below     CHL IP DIET RECOMMENDATION 12/04/2014  SLP Diet Recommendations Dysphagia 2 (Fine chop);Nectar  Liquid Administration via (None)  Medication Administration Crushed with puree  Compensations Slow rate;Small sips/bites;Multiple dry swallows after each  bite/sip;Clear throat intermittently;Chin tuck  Postural Changes and/or Swallow Maneuvers (None)     CHL IP OTHER RECOMMENDATIONS 12/04/2014  Recommended Consults (None)  Oral Care Recommendations Oral care BID  Other Recommendations Order thickener from pharmacy;Clarify dietary  restrictions     No flowsheet data found.   CHL IP FREQUENCY AND DURATION 12/04/2014  Speech Therapy Frequency (ACUTE ONLY) min 2x/week  Treatment Duration 2 weeks     Pertinent Vitals/Pain none    SLP Swallow Goals No flowsheet data found.  No flowsheet data found.    CHL IP REASON FOR REFERRAL 12/04/2014  Reason for Referral Objectively evaluate swallowing function     CHL IP ORAL PHASE 12/04/2014  Lips (None)  Tongue (None)  Mucous membranes (None)  Nutritional status (None)  Other (None)  Oxygen therapy (None)  Oral  Phase Impaired  Oral - Pudding Teaspoon (None)  Oral - Pudding Cup (None)  Oral - Honey Teaspoon (None)  Oral - Honey Cup (None)  Oral - Honey Syringe (None)  Oral - Nectar Teaspoon (None)  Oral - Nectar Cup (None)  Oral - Nectar Straw (None)  Oral - Nectar Syringe (None)  Oral - Ice Chips (None)  Oral - Thin Teaspoon (None)  Oral - Thin Cup (None)  Oral - Thin Straw (None)  Oral - Thin Syringe (None)  Oral - Puree (None)  Oral - Mechanical Soft (None)  Oral - Regular (None)  Oral - Multi-consistency (None)  Oral - Pill (None)  Oral Phase - Comment (None)      CHL IP PHARYNGEAL PHASE 12/04/2014  Pharyngeal Phase Impaired  Pharyngeal - Pudding Teaspoon (None)  Penetration/Aspiration details (pudding teaspoon) (None)  Pharyngeal - Pudding Cup (None)  Penetration/Aspiration details (pudding cup) (None)  Pharyngeal - Honey Teaspoon (None)  Penetration/Aspiration details (honey teaspoon) (None)  Pharyngeal - Honey Cup (None)  Penetration/Aspiration details (honey cup) (None)  Pharyngeal - Honey Syringe (None)  Penetration/Aspiration details (honey syringe) (None)  Pharyngeal - Nectar Teaspoon (None)  Penetration/Aspiration details (nectar teaspoon) (None)  Pharyngeal - Nectar Cup (None)  Penetration/Aspiration details (nectar cup) (None)  Pharyngeal - Nectar Straw (None)  Penetration/Aspiration details (nectar straw) (None)  Pharyngeal - Nectar Syringe (None)  Penetration/Aspiration details (nectar syringe) (None)  Pharyngeal - Ice Chips (None)  Penetration/Aspiration details (ice chips) (None)  Pharyngeal - Thin Teaspoon (None)  Penetration/Aspiration details (thin teaspoon) (None)  Pharyngeal - Thin Cup (None)  Penetration/Aspiration details (thin cup) (None)  Pharyngeal - Thin Straw (None)  Penetration/Aspiration details (thin straw) (None)  Pharyngeal - Thin Syringe (None)  Penetration/Aspiration details (thin syringe') (None)  Pharyngeal - Puree (None)  Penetration/Aspiration details (puree) (None)  Pharyngeal -  Mechanical Soft (None)  Penetration/Aspiration details (mechanical soft) (None)  Pharyngeal - Regular (None)  Penetration/Aspiration details (regular) (None)  Pharyngeal - Multi-consistency (None)  Penetration/Aspiration details (multi-consistency) (None)  Pharyngeal - Pill (None)  Penetration/Aspiration details (pill) (None)  Pharyngeal Comment (None)      CHL IP CERVICAL ESOPHAGEAL PHASE 12/04/2014  Cervical Esophageal Phase Impaired  Pudding Teaspoon (None)  Pudding Cup (None)  Honey Teaspoon (  None)  Honey Cup (None)  Honey Straw (None)  Nectar Teaspoon Reduced cricopharyngeal relaxation;Esophageal backflow  into cervical esophagus  Nectar Cup Reduced cricopharyngeal relaxation;Esophageal backflow into  cervical esophagus  Nectar Straw (None)  Nectar Sippy Cup (None)  Thin Teaspoon (None)  Thin Cup Reduced cricopharyngeal relaxation;Esophageal backflow into  cervical esophagus  Thin Straw (None)  Thin Sippy Cup (None)  Cervical Esophageal Comment (None)    No flowsheet data found.         Kern Reap, MA, CCC-SLP 12/04/2014, 1:55 PM [id number]    Thurnell Lose, MD  [facility] Pager:[phone]  If 7PM-7AM, please contact night-coverage [email or web address] Password [email or web address] 12/13/2014, 10:04 AM   LOS: 11 days

## 2014-12-13 NOTE — Progress Notes (Signed)
Physical Therapy Treatment Patient Details Name: Logan May MRN: [id number] DOB: 21-Jun-1945 Today's Date: 12/13/2014    History of Present Illness 69 y.o. male admitted to [facility] on 12/02/14 for cough, SOB, and weakness.  Dx with CAP, A-fib, weakness.  Pt with significant PMhx of anxiety, HTN, peripheral neuropathy, DM, diabetic charcot's foot, s/p surgery and wears shoes with inserts, and L toe amputation.  Pt now s/p thoracentesis and VATS, and ventillated.    PT Comments    Logan May is showing very modest progress w/ mobility and required mod +2 assist for sit>stand using RW and max +2 assist for stand pivot using RW.  Pt appears unaware of deficits and safety and remains appropriate for SNF upon d/c.     Follow Up Recommendations  SNF;Supervision/Assistance - 24 hour     Equipment Recommendations  Rolling walker with 5" wheels    Recommendations for Other Services       Precautions / Restrictions Precautions Precautions: Fall Restrictions Weight Bearing Restrictions: No    Mobility  Bed Mobility Overal bed mobility: Needs Assistance;+2 for physical assistance Bed Mobility: Sidelying to Sit   Sidelying to sit: Mod assist;+2 for physical assistance;HOB elevated       General bed mobility comments: Pt sidelying on L upon PT arrival w/ nurse tech in room.  +2 assist required to assist pt w/ pushing up to sitting, supporting trunk and using bed pad for positioning of BLEs off EOB.  Tactile and verbal cues provided for hand placement on bed to improve sitting balance.  Transfers Overall transfer level: Needs assistance Equipment used: Rolling walker (2 wheeled) Transfers: Sit to/from [facility] Sit to Stand: +2 physical assistance;Mod assist Stand pivot transfers: Max assist;+2 physical assistance       General transfer comment: +2 assist w/ sit>stand w/ RW cueing pt to stand.  Pt's trunk remains flexed despite verbal and tactile cues to stand  upright and pt was impulsive pivoting to chair attempting to prematurely sit before properly positioned.    Ambulation/Gait                 Stairs            Wheelchair Mobility    Modified Rankin (Stroke Patients Only)       Balance Overall balance assessment: Needs assistance Sitting-balance support: Bilateral upper extremity supported;Feet supported Sitting balance-Leahy Scale: Poor Sitting balance - Comments: Requires UE support and occasional assist to maintain seated balance.    Standing balance support: Bilateral upper extremity supported;During functional activity Standing balance-Leahy Scale: Zero Standing balance comment: +2 max assist to achieve stand pivot to chair                     Cognition Arousal/Alertness: Awake/alert Behavior During Therapy: Restless Overall Cognitive Status: Impaired/Different from baseline Area of Impairment: Orientation;Memory;Safety/judgement;Awareness;Following commands;Problem solving Orientation Level: Disoriented to;Place;Situation;Time Current Attention Level: Focused Memory: Decreased short-term memory Following Commands: Follows one step commands with increased time;Follows one step commands consistently Safety/Judgement: Decreased awareness of safety;Decreased awareness of deficits Awareness: Intellectual Problem Solving: Difficulty sequencing;Requires verbal cues;Requires tactile cues      Exercises      General Comments        Pertinent Vitals/Pain Pain Assessment: No/denies pain    Home Living                      Prior Function  PT Goals (current goals can now be found in the care plan section) Acute Rehab PT Goals Patient Stated Goal: none stated Progress towards PT goals: Progressing toward goals (very modestly)    Frequency  Min 2X/week    PT Plan Current plan remains appropriate    Co-evaluation             End of Session Equipment Utilized During  Treatment: Gait belt Activity Tolerance: Patient limited by fatigue;Treatment limited secondary to agitation Patient left: in chair;with call bell/phone within reach;with nursing/sitter in room     Time: 1009-1018 PT Time Calculation (min) (ACUTE ONLY): 9 min  Charges:  $Therapeutic Activity: 8-22 mins                    G Codes:      Logan May PT, DPT [phone] Pager: [phone] 12/13/2014, 10:36 AM

## 2014-12-13 NOTE — Care Management Note (Signed)
Case Management Note  Patient Details  Name: EDSON DERIDDER MRN: [id number] Date of Birth: 05/17/1945  Subjective/Objective:      Patient with severe dysphagia, did not do well with swallow eval, showed signs of aspiration, speech plans to re eval tomorrow with MBS. Patient cont with panda feeds, on iv abx, received runs of k as well. Plan is for snf.               Action/Plan:   Expected Discharge Date:                  Expected Discharge Plan:  Skilled Nursing Facility  In-House Referral:  Clinical Social Work  Discharge planning Services  CM Consult  Post Acute Care Choice:    Choice offered to:     DME Arranged:    DME Agency:     HH Arranged:    Sellers Agency:     Status of Service:  In process, will continue to follow  Medicare Important Message Given:  Yes-fourth notification given Date Medicare IM Given:    Medicare IM give by:    Date Additional Medicare IM Given:    Additional Medicare Important Message give by:     If discussed at [place] of Stay Meetings, dates discussed:    Additional Comments:  Zenon Mayo, RN 12/13/2014, 8:15 PM

## 2014-12-13 NOTE — Clinical Social Work Note (Signed)
CSW spoke to patient and his wife about SNF placement options.  CSW gave patient a list of SNFs available in the [facility] area and gave wife current bed offers.  Patient still has a Actuary, patient has some confusion.  Patient's wife was given the unit CSW contact information in case she has any other questions.  This CSW updated unit CSW on bed offers that were given.  Patient's updated clinicals were also faxed out to [facility].  Patient's wife was explained how insurance pays for SNF placement and she stated she did not have any other questions.  This CSW to sign off, verbal handoff given to unit CSW.  Jones Broom. Dwight, MSW, [place] 12/13/2014 4:42 PM

## 2014-12-13 NOTE — Progress Notes (Signed)
Wallene Huh RN, removed pt wrist restraints at 1900, Air cabin crew at bedside. Will continue to monitor pt.

## 2014-12-14 ENCOUNTER — Inpatient Hospital Stay (HOSPITAL_COMMUNITY): Payer: Medicare Other

## 2014-12-14 LAB — GLUCOSE, CAPILLARY
GLUCOSE-CAPILLARY: 118 mg/dL — AB (ref 65–99)
GLUCOSE-CAPILLARY: 116 mg/dL — AB (ref 65–99)
Glucose-Capillary: 130 mg/dL — ABNORMAL HIGH (ref 65–99)
Glucose-Capillary: 159 mg/dL — ABNORMAL HIGH (ref 65–99)
Glucose-Capillary: 185 mg/dL — ABNORMAL HIGH (ref 65–99)
Glucose-Capillary: 186 mg/dL — ABNORMAL HIGH (ref 65–99)

## 2014-12-14 LAB — COMPREHENSIVE METABOLIC PANEL
ALK PHOS: 65 U/L (ref 38–126)
ALT: 10 U/L — ABNORMAL LOW (ref 17–63)
ANION GAP: 8 (ref 5–15)
AST: 19 U/L (ref 15–41)
Albumin: 1.7 g/dL — ABNORMAL LOW (ref 3.5–5.0)
BILIRUBIN TOTAL: 0.2 mg/dL — AB (ref 0.3–1.2)
BUN: 10 mg/dL (ref 6–20)
CO2: 27 mmol/L (ref 22–32)
CREATININE: 1.02 mg/dL (ref 0.61–1.24)
Calcium: 7.5 mg/dL — ABNORMAL LOW (ref 8.9–10.3)
Chloride: 106 mmol/L (ref 101–111)
GFR calc Af Amer: >60 mL/min (ref >60)
GFR calc non Af Amer: >60 mL/min (ref >60)
Glucose, Bld: 206 mg/dL — ABNORMAL HIGH (ref 65–99)
Potassium: 3.3 mmol/L — ABNORMAL LOW (ref 3.5–5.1)
Sodium: 141 mmol/L (ref 135–145)
Total Protein: 5.7 g/dL — ABNORMAL LOW (ref 6.5–8.1)

## 2014-12-14 LAB — CBC
HCT: 25.5 % — ABNORMAL LOW (ref 39.0–52.0)
HEMOGLOBIN: 8 g/dL — AB (ref 13.0–17.0)
MCH: 28.2 pg (ref 26.0–34.0)
MCHC: 31.4 g/dL (ref 30.0–36.0)
MCV: 89.8 fL (ref 78.0–100.0)
Platelets: 405 10*3/uL — ABNORMAL HIGH (ref 150–400)
RBC: 2.84 MIL/uL — ABNORMAL LOW (ref 4.22–5.81)
RDW: 16 % — ABNORMAL HIGH (ref 11.5–15.5)
WBC: 10.9 10*3/uL — ABNORMAL HIGH (ref 4.0–10.5)

## 2014-12-14 LAB — MAGNESIUM: Magnesium: 1.6 mg/dL — ABNORMAL LOW (ref 1.7–2.4)

## 2014-12-14 MED ORDER — MAGNESIUM SULFATE 2 GM/50ML IV SOLN
2.0000 g | Freq: Once | INTRAVENOUS | Status: AC
  Administered 2014-12-14: 2 g via INTRAVENOUS
  Filled 2014-12-14: qty 50

## 2014-12-14 MED ORDER — SCOPOLAMINE 1 MG/3DAYS TD PT72
1.0000 | MEDICATED_PATCH | TRANSDERMAL | Status: DC
  Administered 2014-12-14 – 2014-12-17 (×2): 1.5 mg via TRANSDERMAL
  Filled 2014-12-14 (×2): qty 1

## 2014-12-14 MED ORDER — LORAZEPAM 2 MG/ML IJ SOLN
1.0000 mg | Freq: Once | INTRAMUSCULAR | Status: AC
  Administered 2014-12-14: 1 mg via INTRAVENOUS
  Filled 2014-12-14 (×2): qty 1

## 2014-12-14 MED ORDER — DEXTROSE-NACL 5-0.45 % IV SOLN
INTRAVENOUS | Status: DC
  Administered 2014-12-14 – 2014-12-15 (×2): via INTRAVENOUS

## 2014-12-14 MED ORDER — POTASSIUM CHLORIDE 10 MEQ/100ML IV SOLN
10.0000 meq | INTRAVENOUS | Status: AC
  Administered 2014-12-14 (×4): 10 meq via INTRAVENOUS
  Filled 2014-12-14 (×4): qty 100

## 2014-12-14 NOTE — Progress Notes (Signed)
[id number] Safety sitter at bedside, removed pt wrist restraints. Will continue to monitor and make day RN aware.

## 2014-12-14 NOTE — Progress Notes (Signed)
9 Days Post-Op Procedure(s) (LRB): VIDEO ASSISTED THORACOSCOPY (VATS)/EMPYEMA (N/A) VIDEO BRONCHOSCOPY (N/A) Subjective: Less confused today, speech still a little garbled  Objective: Vital signs in last 24 hours: Temp:  [97.3 F (36.3 C)-99.5 F (37.5 C)] 98.3 F (36.8 C) (08/09 0811) Pulse Rate:  [107-118] 113 (08/09 0811) Cardiac Rhythm:  [-] Atrial fibrillation (08/08 2100) Resp:  [18-20] 20 (08/09 0811) BP: (97-150)/(53-86) 150/81 mmHg (08/09 0811) SpO2:  [89 %-100 %] 96 % (08/09 0811) Weight:  [182 lb 8.7 oz (82.8 kg)] 182 lb 8.7 oz (82.8 kg) (08/09 0500)  Hemodynamic parameters for last 24 hours:    Intake/Output from previous day: 08/08 0701 - 08/09 0700 In: 277.2 [I.V.:227.2; IV Piggyback:50] Out: 800 [Urine:800] Intake/Output this shift:    Lungs: clear anteriorly, decreased in bases Abdomen: normal findings: soft, non-tender Wound: clean and dry  Lab Results:  Recent Labs  12/13/14 0458 12/14/14 0520  WBC 11.2* 10.9*  HGB 8.1* 8.0*  HCT 25.8* 25.5*  PLT 428* 405*   BMET:  Recent Labs  12/13/14 0458 12/14/14 0520  NA 141 141  K 3.3* 3.3*  CL 109 106  CO2 26 27  GLUCOSE 203* 206*  BUN 8 10  CREATININE 0.93 1.02  CALCIUM 7.3* 7.5*    PT/INR: No results for input(s): LABPROT, INR in the last 72 hours. ABG    Component Value Date/Time   PHART 7.434 12/06/2014 0610   HCO3 23.4 12/06/2014 0610   TCO2 24.5 12/06/2014 0610   ACIDBASEDEF 0.3 12/06/2014 0610   O2SAT 94.5 12/06/2014 0610   CBG (last 3)   Recent Labs  12/14/14 0033 12/14/14 0431 12/14/14 0811  GLUCAP 186* 185* 159*    Assessment/Plan: S/P Procedure(s) (LRB): VIDEO ASSISTED THORACOSCOPY (VATS)/EMPYEMA (N/A) VIDEO BRONCHOSCOPY (N/A) -  Confusion/ delirium seems better today  For repeat MBS  Chest xray shows bibasilar atelectasis, consolidation RLL, pleural thickening, possible small effusion   LOS: 12 days    Melrose Nakayama 12/14/2014

## 2014-12-14 NOTE — Progress Notes (Signed)
Pt safety sitter was sent to another case at 2345, pt still pulling at lines. Notified on call NP, Schorr. Order bilateral wrist until alterative method can be placed. Notified pt spouse of situation.

## 2014-12-14 NOTE — Progress Notes (Addendum)
Speech Language Pathology Treatment: Dysphagia  Patient Details Name: Logan May MRN: [id number] DOB: 07/26/1945 Today's Date: 12/14/2014 Time: 3419-3790 SLP Time Calculation (min) (ACUTE ONLY): 17 min  Assessment / Plan / Recommendation Clinical Impression  Dysphagia treatment provided today to assess PO readiness. Upon entering room, pt had gurgly voice quality and thick, ropey secretions on roof of mouth/ uvula. Provided thorough oral care with suction; when cued pt to cough, copious amounts of secretions came up which were suctioned. Pt continues to have confusion and needed max cues to continue coughing. Pt not appropriate for PO trials today or for MBS as pt is not managing secretions well. Noted that NG tube has been removed; unfortunately cannot recommend a safe diet at this time and it would not be safe or beneficial to proceed with MBS today. Have not seen significant progress with this pt over the course of hospital stay; may need longer-term alternative means of nutrition. Will continue to follow for PO readiness.     HPI Other Pertinent Information: 69 year old male admitted 7/28 with complaints of productive cough and SOB x 3 weeks. Admitted 7/28 for R sided CAP with associated effusion on CXR. PCCM consulted for evaluation of effusion. 7/31 rt vats for empyema with VDRF and hypotesion. History of ETOH, pt has been confused and agitated. MBS on 7/30 recommended Dys 2 diet with nectar via teaspoon with a chin tuck. After this assessment pt was intubated for procedure from 7/31 to 8/3.    Pertinent Vitals Pain Assessment: No/denies pain  SLP Plan  Continue with current plan of care    Recommendations Diet recommendations: NPO Medication Administration: Via alternative means              Oral Care Recommendations: Oral care QID Plan: Continue with current plan of care    GO     Kern Reap, [place], CCC-SLP 12/14/2014, 9:16 AM [phone]

## 2014-12-14 NOTE — Progress Notes (Signed)
PATIENT DETAILS Name: Logan May Age: 69 y.o. 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 PCCM was consulted, underwent thoracocentesis on 7/30-pleural fluid was consistent with an empyema. Subsequently cardiothoracic surgery was consulted, and a right sided VATS, drainage of empyema and visceral/pleural decortication was done on 7/31. Patient subsequently developed respiratory failure and required mechanical ventilation till 8/3. ICU course was complicated by development of atrial fibrillation with RVR requiring amiodarone infusion and severe delirium thought to be secondary to alcohol withdrawal. Upon clinical stability, patient was transferred to the hospitalist service on 8/6.  Subjective:  Is confused but denies any headache, no chest or abdominal pain. Is following basic commands. Denies any focal weakness.  Assessment/Plan:  Severe sepsis from right-sided pneumonia and empyema likely due to underlying micro-aspiration: With acute hypoxic respiratory failure requiring intubation.  Sepsis pathophysiology has resolved following VATS/decortication. He required brief intubation and ICU stay was extubated on 12/07/2014. Blood culture neg, but tissue culture positive for microaerophilic streptococcus will taper down to Rocephin at this point.  He continues to exhibit signs of aspiration, despite NG tube feeding patient is clinically aspirating and I suspect he is even aspirating his oral secretions, therapy on board, I have discontinued  NG tube on 12/14/2014. Have added anti-suction and pulmonary toiletry. Will hydrate with IV fluids for the next few days, repeat swallow eval. Long-term prognosis appears to be poor.  Continue supportive care for respiratory failure with oxygen nebulizer treatments and pulmonary toiletry.    Right sided Empyema I suspect from long-standing microaspiration: underwent VATS, drainage of empyema and visceral/pleural decortication on 7/31.Tissue culture 7/31 positive for Microaerophilic streptococci,  AFB culture/fungal culture negative so far. ABX adjusted based on cultures - Abx stop date 8/13   Severe Dysphagia: Suspect long-term microaspiration, speech on board, hold NG tube as he's continuing to aspirate his oral secretions and any regurgitation of GI content.   Acute Urinary Retention:underwent Flexible cytoscopy and passage of foley catheter by Dr Gaynelle Arabian on 7/31   Afib UVO:[id number] Amiodarone gtt for rate control, now on prn lopressor. Felt to be a poor candidate for long term anticoagulation-although has a CHADS2Vasc SCORE of  3. Stared on ASA  ETOH withdrawal with delirium and metabolic encephalopathy:required Precedex gtt while in [place] less agitated-still somewhat confused-continue Ativan per CIWA protocol. Counseled to quit alcohol.  Enterococcal UTI: Seen on urine culture 7/29-this is pansensitive. Has been treated with Zosyn.  Left upper Ext swelling: stable Doppler  Anemia: likely secondary to  critical illness. Follow CBC and transfuse prn if HB<7  Hx of COPD with emphysema:continue bronchodilators-lungs clear  Dyslipidemia:continue Statin  Severe Deconditioning:PT on board-SNF on discharge  Type 2 DM: CBG's stable with SSI. A1C 6.8 on 7/29  CBG (last 3)   Recent Labs  12/14/14 0033 12/14/14 0431 12/14/14 0811  GLUCAP 186* 185* 159*    Hypokalemia and hypomagnesemia. Both replaced.     Disposition: Remain inpatient-SNF on  discharge   Antimicrobial agents  See below  Anti-infectives    Start  Dose/Rate Route Frequency Ordered Stop   12/12/14 1600  cefTRIAXone (ROCEPHIN) 1 g in dextrose 5 % 50 mL IVPB     1 g 100 mL/hr over 30 Minutes Intravenous Every 24 hours 12/12/14 1451     12/12/14 1200  levofloxacin (LEVAQUIN) IVPB 750 mg  Status:  Discontinued     750 mg 100 mL/hr over 90 Minutes Intravenous Every 24 hours 12/12/14 1042 12/12/14 1451   12/10/14 2100  piperacillin-tazobactam (ZOSYN) IVPB 3.375 g  Status:  Discontinued     3.375 g 12.5 mL/hr over 240 Minutes Intravenous Every 8 hours 12/10/14 1432 12/12/14 1035   12/05/14 0900  vancomycin (VANCOCIN) IVPB 1000 mg/200 mL premix  Status:  Discontinued     1,000 mg 200 mL/hr over 60 Minutes Intravenous Every 12 hours 12/04/14 2003 12/09/14 0736   12/05/14 0600  vancomycin (VANCOCIN) IVPB 1000 mg/200 mL premix  Status:  Discontinued     1,000 mg 200 mL/hr over 60 Minutes Intravenous To Surgery 12/04/14 1604 12/04/14 2000   12/04/14 2100  piperacillin-tazobactam (ZOSYN) IVPB 3.375 g  Status:  Discontinued     3.375 g 12.5 mL/hr over 240 Minutes Intravenous Every 8 hours 12/04/14 2003 12/10/14 1432   12/04/14 2100  vancomycin (VANCOCIN) 1,500 mg in sodium chloride 0.9 % 500 mL IVPB     1,500 mg 250 mL/hr over 120 Minutes Intravenous  Once 12/04/14 2003 12/04/14 2348   12/03/14 1330  levofloxacin (LEVAQUIN) IVPB 750 mg  Status:  Discontinued     750 mg 100 mL/hr over 90 Minutes Intravenous Every 24 hours 12/02/14 1514 12/04/14 1951   12/02/14 1130  cefTRIAXone (ROCEPHIN) 1 g in dextrose 5 % 50 mL IVPB     1 g 100 mL/hr over 30 Minutes Intravenous  Once 12/02/14 1118 12/02/14 1246   12/02/14 1130  azithromycin (ZITHROMAX) 500 mg in dextrose 5 % 250 mL IVPB     500 mg 250 mL/hr over 60 Minutes Intravenous  Once 12/02/14 1118 12/02/14 1429      DVT Prophylaxis: Prophylactic Lovenox   Code Status: Full code   Family Communication Wife  bedside 12-12-14  Procedures: 7/30>>Thoracocentesis 7/31>>VATS, drainage of empyema and visceral/pleural decortication(Dr Hendrickson) 7/31>> Flexible cytoscopy and passage of foley catheter (Dr Gaynelle Arabian) 7/31>>8/3 ETT 12/12/2014 bilateral upper extremity venous duplex - no DVT SVT  CONSULTS:   pulmonary/intensive care, urology and CTVS  Time spent 40 minutes-Greater than 50% of this time was spent in counseling, explanation of diagnosis, planning of further management, and coordination of care.  MEDICATIONS: Scheduled Meds: . acidophilus  2 capsule Oral Daily  . aspirin EC  81 mg Oral Daily  . atorvastatin  20 mg Oral q1800  . bisacodyl  10 mg Oral Daily  . cefTRIAXone (ROCEPHIN)  IV  1 g Intravenous Q24H  . cholecalciferol  2,000 Units Oral Once per day on Mon Tue Wed Thu Fri  . cholecalciferol  4,000 Units Oral Once per day on Sun Sat  . enoxaparin (LOVENOX) injection  40 mg Subcutaneous Q24H  . folic acid  1 mg Intravenous Daily  . insulin aspart  0-15 Units Subcutaneous 6 times per day  . ipratropium-albuterol  3 mL Nebulization TID  . LORazepam  1 mg Intravenous Once  . metoprolol tartrate  25 mg Oral BID  . scopolamine  1 patch Transdermal Q72H  . senna-docusate  1 tablet Oral QHS  . thiamine IV  100 mg Intravenous Daily   Continuous Infusions: . dextrose 5 % and  0.45% NaCl    . feeding supplement (VITAL AF 1.2 CAL) 1,000 mL (12/12/14 1644)   PRN Meds:.acetaminophen, albuterol, fentaNYL (SUBLIMAZE) injection, Gerhardt's butt cream, ipratropium-albuterol, LORazepam, metoprolol, ondansetron (ZOFRAN) IV, sodium chloride    PHYSICAL EXAM: Vital signs in last 24 hours: Filed Vitals:   12/14/14 0433 12/14/14 0500 12/14/14 0811 12/14/14 0947  BP: 143/84  150/81   Pulse: 108  113   Temp: 99.5 F (37.5 C)  98.3 F (36.8 C)   TempSrc: Oral  Oral   Resp: 20  20   Height:      Weight:  82.8 kg (182 lb 8.7 oz)    SpO2: 96%  96% 96%    Weight change: -3 kg (-6 lb  9.8 oz) Filed Weights   12/12/14 2102 12/13/14 0500 12/14/14 0500  Weight: 85.8 kg (189 lb 2.5 oz) 85.6 kg (188 lb 11.4 oz) 82.8 kg (182 lb 8.7 oz)   Body mass index is 25.47 kg/(m^2).   Gen Exam: Awake but confused-however follows some commands. Panda tube in place Neck: Supple, No JVD.  Chest: B/L Clear anteriorly CVS: S1 S2 Regular, no murmurs.  Abdomen: soft, BS +, non tender Extremities: no edema, lower extremities warm to touch. Neurologic: Non Focal-but with gen weakness  Intake/Output from previous day:  Intake/Output Summary (Last 24 hours) at 12/14/14 1145 Last data filed at 12/14/14 0700  Gross per 24 hour  Intake 277.17 ml  Output    800 ml  Net -522.83 ml     LAB RESULTS: CBC  Recent Labs Lab 12/09/14 0409 12/10/14 0416 12/11/14 0515 12/13/14 0458 12/14/14 0520  WBC 8.6 7.9 9.9 11.2* 10.9*  HGB 8.0* 8.0* 8.4* 8.1* 8.0*  HCT 24.9* 25.2* 26.5* 25.8* 25.5*  PLT 366 353 429* 428* 405*  MCV 88.6 89.4 89.2 89.9 89.8  MCH 28.5 28.4 28.3 28.2 28.2  MCHC 32.1 31.7 31.7 31.4 31.4  RDW 15.2 15.7* 15.7* 15.9* 16.0*    Chemistries   Recent Labs Lab 12/09/14 0409 12/10/14 0416 12/10/14 2225 12/11/14 0515 12/13/14 0458 12/14/14 0520  NA 139 141  --  143 141 141  K 3.3* 3.6  --  3.7 3.3* 3.3*  CL 109 111  --  115* 109 106  CO2 22 22  --  23 26 27   GLUCOSE 143* 174*  --  167* 203* 206*  BUN 6 6  --  8 8 10   CREATININE 1.03 0.99  --  0.98 0.93 1.02  CALCIUM 7.1* 7.1*  --  7.0* 7.3* 7.5*  MG  --  1.3* 2.4  --  1.4* 1.6*    CBG:  Recent Labs Lab 12/13/14 1625 12/13/14 2108 12/14/14 0033 12/14/14 0431 12/14/14 0811  GLUCAP 201* 173* 186* 185* 159*    GFR Estimated Creatinine Clearance: 71.8 mL/min (by C-G formula based on Cr of 1.02).  Coagulation profile No results for input(s): INR, PROTIME in the last 168 hours.  Cardiac Enzymes No results for input(s): CKMB, TROPONINI, MYOGLOBIN in the last 168 hours.  Invalid input(s): CK  Invalid  input(s): POCBNP No results for input(s): DDIMER in the last 72 hours. No results for input(s): HGBA1C in the last 72 hours. No results for input(s): CHOL, HDL, LDLCALC, TRIG, CHOLHDL, LDLDIRECT in the last 72 hours. No results for input(s): TSH, T4TOTAL, T3FREE, THYROIDAB in the last 72 hours.  Invalid input(s): FREET3 No results for input(s): VITAMINB12, FOLATE, FERRITIN, TIBC, IRON, RETICCTPCT in the last 72 hours. No results for input(s): LIPASE, AMYLASE  in the last 72 hours.  Urine Studies No results for input(s): UHGB, CRYS in the last 72 hours.  Invalid input(s): UACOL, UAPR, USPG, UPH, UTP, UGL, UKET, UBIL, UNIT, UROB, ULEU, UEPI, UWBC, URBC, UBAC, CAST, UCOM, BILUA  MICROBIOLOGY: Recent Results (from the past 240 hour(s))  Gram stain     Status: None   Collection Time: 12/04/14 12:11 PM  Result Value Ref Range Status   Specimen Description PLEURAL  Final   Special Requests NONE  Final   Gram Stain   Final    ABUNDANT WBC PRESENT,BOTH PMN AND MONONUCLEAR FEW GRAM POSITIVE COCCI IN CHAINS IN CLUSTERS FEW GRAM NEGATIVE COCCI IN CHAINS IN CLUSTERS CRITICAL RESULT CALLED TO, READ BACK BY AND VERIFIED WITH: SCALCO,S RN 12/04/14 [place] CONFIRMED BY V WILKINS    Report Status 12/04/2014 FINAL  Final  Fungus Culture with Smear     Status: None (Preliminary result)   Collection Time: 12/04/14 12:11 PM  Result Value Ref Range Status   Specimen Description PLEURAL  Final   Special Requests NONE  Final   Fungal Smear   Final    NO YEAST OR FUNGAL ELEMENTS SEEN Performed at [facility]    Culture   Final    CULTURE IN PROGRESS FOR FOUR WEEKS Performed at [facility]    Report Status PENDING  Incomplete  Culture, body fluid-bottle     Status: None   Collection Time: 12/04/14 12:11 PM  Result Value Ref Range Status   Specimen Description PLEURAL  Final   Special Requests NONE  Final   Culture NO GROWTH 5 DAYS  Final   Report Status 12/09/2014 FINAL   Final  Surgical pcr screen     Status: None   Collection Time: 12/05/14  1:23 AM  Result Value Ref Range Status   MRSA, PCR NEGATIVE NEGATIVE Final   Staphylococcus aureus NEGATIVE NEGATIVE Final    Comment:        The Xpert SA Assay (FDA approved for NASAL specimens in patients over 95 years of age), is one component of a comprehensive surveillance program.  Test performance has been validated by [facility] for patients greater than or equal to 10 year old. It is not intended to diagnose infection nor to guide or monitor treatment.   Culture, bal-quantitative     Status: None   Collection Time: 12/05/14 10:09 AM  Result Value Ref Range Status   Specimen Description BRONCHIAL ALVEOLAR LAVAGE  Final   Special Requests BRONCHIAL WASHINGS  Final   Gram Stain   Final    NO WBC SEEN NO SQUAMOUS EPITHELIAL CELLS SEEN NO ORGANISMS SEEN Performed at [facility]    Colony Count NO GROWTH Performed at [facility]   Final   Culture   Final    NO GROWTH 2 DAYS Performed at [facility]    Report Status 12/07/2014 FINAL  Final  Fungus Culture with Smear     Status: None (Preliminary result)   Collection Time: 12/05/14 10:09 AM  Result Value Ref Range Status   Specimen Description BRONCHIAL ALVEOLAR LAVAGE  Final   Special Requests BRONCHIAL WASHINGS  Final   Fungal Smear   Final    NO YEAST OR FUNGAL ELEMENTS SEEN Performed at [facility]    Culture   Final    CULTURE IN PROGRESS FOR FOUR WEEKS Performed at [facility]    Report Status PENDING  Incomplete  AFB culture with  smear     Status: None (Preliminary result)   Collection Time: 12/05/14 10:09 AM  Result Value Ref Range Status   Specimen Description BRONCHIAL ALVEOLAR LAVAGE  Final   Special Requests BRONCHIAL WASHINGS  Final   Acid Fast Smear   Final    NO ACID FAST BACILLI SEEN Performed at [facility]    Culture   Final    CULTURE WILL BE EXAMINED FOR 6  WEEKS BEFORE ISSUING A FINAL REPORT Performed at [facility]    Report Status PENDING  Incomplete  AFB culture with smear     Status: None (Preliminary result)   Collection Time: 12/05/14 10:42 AM  Result Value Ref Range Status   Specimen Description PLEURAL RIGHT FLUID  Final   Special Requests SPEC B  Final   Acid Fast Smear   Final    NO ACID FAST BACILLI SEEN Performed at [facility]    Culture   Final    CULTURE WILL BE EXAMINED FOR 6 WEEKS BEFORE ISSUING A FINAL REPORT Performed at [facility]    Report Status PENDING  Incomplete  Anaerobic culture     Status: None   Collection Time: 12/05/14 10:42 AM  Result Value Ref Range Status   Specimen Description PLEURAL RIGHT FLUID  Final   Special Requests SPEC D  Final   Gram Stain   Final    FEW WBC PRESENT, PREDOMINANTLY PMN NO SQUAMOUS EPITHELIAL CELLS SEEN FEW GRAM POSITIVE COCCI IN PAIRS    Culture NO ANAEROBES ISOLATED  Final   Report Status 12/11/2014 FINAL  Final  Body fluid culture     Status: None   Collection Time: 12/05/14 10:42 AM  Result Value Ref Range Status   Specimen Description PLEURAL  Final   Special Requests NONE  Final   Gram Stain   Final    DEGENERATE WBC PRESENT ON SLIDE FEW GRAM VARIABLE COCCI CRITICAL RESULT CALLED TO, READ BACK BY AND VERIFIED WITH: [place] RN 12/05/14 2050 [place] BY V WILKINS    Culture   Final    ABUNDANT MICROAEROPHILIC STREPTOCOCCI Standardized susceptibility testing for this organism is not available.    Report Status 12/09/2014 FINAL  Final  Tissue culture     Status: None   Collection Time: 12/05/14 10:51 AM  Result Value Ref Range Status   Specimen Description TISSUE  Final   Special Requests RIGHT PLEURAL PEEL CUP C PT ON VANC  Final   Gram Stain   Final    MODERATE WBC PRESENT, PREDOMINANTLY PMN NO SQUAMOUS EPITHELIAL CELLS SEEN ABUNDANT GRAM POSITIVE COCCI IN PAIRS IN CHAINS Performed at [facility]     Culture   Final    ABUNDANT MICROAEROPHILIC STREPTOCOCCI Note: Standardized susceptibility testing for this organism is not available. Performed at [facility]    Report Status 12/08/2014 FINAL  Final  Fungus Culture with Smear     Status: None (Preliminary result)   Collection Time: 12/05/14 10:51 AM  Result Value Ref Range Status   Specimen Description TISSUE  Final   Special Requests RIGHT PLEURAL PEEL CUP C PT ON VANC  Final   Fungal Smear   Final    NO YEAST OR FUNGAL ELEMENTS SEEN Performed at [facility]    Culture   Final    CULTURE IN PROGRESS FOR FOUR WEEKS Performed at [facility]    Report Status PENDING  Incomplete  AFB culture with smear  Status: None (Preliminary result)   Collection Time: 12/05/14 10:51 AM  Result Value Ref Range Status   Specimen Description TISSUE  Final   Special Requests RIGHT PLEURAL PEEL CUP C PT ON VANC  Final   Acid Fast Smear   Final    NO ACID FAST BACILLI SEEN Performed at [facility]    Culture   Final    CULTURE WILL BE EXAMINED FOR 6 WEEKS BEFORE ISSUING A FINAL REPORT Performed at [facility]    Report Status PENDING  Incomplete    RADIOLOGY STUDIES/RESULTS: Dg Chest 1 View  12/04/2014   CLINICAL DATA:  Post right thoracentesis  EXAM: CHEST  1 VIEW  COMPARISON:  12/02/2014  FINDINGS: Diffuse right lung airspace disease again noted, slightly improved. Decreasing right effusion following thoracentesis. No pneumothorax. Heart is borderline in size. Nodular density projecting over the left lung base is felt represent nipple shadow. Left lung is clear. No acute bony abnormality.  IMPRESSION: Decreasing right effusion and diffuse right lung airspace disease. No pneumothorax.   Electronically Signed   By: Rolm Baptise M.D.   On: 12/04/2014 13:10   Dg Chest 2 View  12/02/2014   CLINICAL DATA:  Altered level of consciousness.  EXAM: CHEST  2 VIEW  COMPARISON:  Sep 04, 2012.  FINDINGS: The  heart size and mediastinal contours are within normal limits. No pneumothorax is noted. Left lung is clear. There is now noted diffuse opacification of the right lower lobe most consistent with pneumonia with associated pleural effusion. The visualized skeletal structures are unremarkable.  IMPRESSION: Diffuse opacification of the right lower lobe most consistent with pneumonia with associated pleural effusion. Follow-up radiographs are recommended.   Electronically Signed   By: Marijo Conception, M.D.   On: 12/02/2014 10:57   Dg Abd 1 View  12/12/2014   CLINICAL DATA:  Feeding tube placement  EXAM: ABDOMEN - 1 VIEW  COMPARISON:  12/10/2014  FINDINGS: Tip of feeding tube projects over proximal stomach.  Air-filled upper normal caliber loops of small bowel.  Scattered colonic gas.  No evidence of bowel obstruction or dilatation.  Scattered degenerative disc disease changes lumbar spine.  Foley catheter in urinary bladder.  IMPRESSION: Tip of feeding tube projects over proximal stomach.   Electronically Signed   By: Lavonia Dana M.D.   On: 12/12/2014 10:05   Ct Chest Wo Contrast  12/04/2014   CLINICAL DATA:  Right hemithorax opacification, assess hemothorax, effusion and loculation. Review of electronic records demonstrates history of productive cough and shortness of breath for 3 weeks. Recent falls.  EXAM: CT CHEST WITHOUT CONTRAST  TECHNIQUE: Multidetector CT imaging of the chest was performed following the standard protocol without IV contrast.  COMPARISON:  Chest radiograph 12/02/2014  FINDINGS: Large volume of loculated right pleural fluid. Within the right lower hemithorax are multiple foci of air within the large volume pleural fluid raising concern for empyema. There is mass effect on the adjacent bronchi, with compressive atelectasis of the dependent right upper lobe and consolidation in the right middle lobe with air bronchograms. The origin of the right lower lobe bronchus is not well-defined, and no  aerated right lower lobe lung is confidently identified. Pleural fluid is heterogeneous in density.  There is a small left pleural effusion that appears simple fluid in density. Adjacent compressive atelectasis in the lower lobe. The left upper lobe is clear.  Heart at the upper limits of normal in size. There are dense coronary artery  calcifications. No definite pericardial fluid. Prominent small superior mediastinal lymph nodes measuring 9 mm short axis dimension. Limited assessment for hilar adenopathy. Mild atherosclerosis of normal caliber thoracic aorta.  No definite acute abnormality in the included upper abdomen, perinephric stranding is partially included.  There are no acute rib fractures. Remote nondisplaced lower right rib fractures with well-formed callus. Age related degenerative change throughout spine. No blastic or destructive lytic osseous lesions.  IMPRESSION: 1. Large partially loculated heterogeneous right pleural effusion. In the lower portion are multiple foci of air. Findings are most concerning for empyema. There is consolidation or atelectasis of the entire right lower lobe, consolidation with air bronchograms in the right middle lobe, and compressive atelectasis of the adjacent right upper lobe. Hemothorax is felt less likely, given the right lower rib fractures appear remote. 2. Small left pleural effusion with adjacent compressive atelectasis. 3. Dense coronary artery calcifications.   Electronically Signed   By: Jeb Levering M.D.   On: 12/04/2014 02:42   Dg Chest Port 1 View  12/14/2014   CLINICAL DATA:  Pneumonia.  EXAM: PORTABLE CHEST - 1 VIEW  COMPARISON:  12/11/2014  FINDINGS: Right jugular catheter tip in the SVC unchanged. Feeding tube tip in the proximal stomach.  Right lower lobe airspace disease unchanged with small right effusion  Left lower lobe consolidation unchanged, with left effusion  Cardiac enlargement with mild vascular congestion.  IMPRESSION: Right lower lobe  infiltrate and effusion, worrisome for pneumonia  Left lower lobe consolidation and effusion possibly atelectasis or pneumonia  Pulmonary vascular congestion.   Electronically Signed   By: Franchot Gallo M.D.   On: 12/14/2014 08:00   Dg Chest Port 1 View  12/11/2014   CLINICAL DATA:  69 year old male with history of empyema.  EXAM: PORTABLE CHEST - 1 VIEW  COMPARISON:  Chest x-ray 12/10/2014.  FINDINGS: Previously noted right-sided chest tube has been removed. No appreciable right pneumothorax. Right IJ catheter with tip terminating in the distal superior vena cava. Feeding tube extending into the upper abdomen, with tip below the lower margin of the image. Lung volumes are slightly low. Bibasilar opacities may reflect areas of atelectasis and/or consolidation. Small to moderate bilateral pleural effusions. Cephalization of pulmonary vasculature. Mild cardiomegaly. Upper mediastinal contours are slightly distorted by patient positioning.  IMPRESSION: 1. Support apparatus, as above. 2. No definite right pneumothorax following right-sided chest tube removal. 3. Bibasilar opacities may reflect areas of atelectasis and/or consolidation, with superimposed small to moderate bilateral pleural effusions. 4. Cardiomegaly with pulmonary venous congestion, but no frank pulmonary edema.   Electronically Signed   By: Vinnie Langton M.D.   On: 12/11/2014 08:36   Dg Chest Port 1 View  12/10/2014   CLINICAL DATA:  69 year old male status post VATS for empyema. Initial encounter.  EXAM: PORTABLE CHEST - 1 VIEW  COMPARISON:  12/09/2014 and earlier.  FINDINGS: Portable AP semi upright view at 0710 hrs. Enteric feeding tube is been placed, tip not included. 1 of the 2 right chest tubes has been removed. Single residual right side chest tube and right IJ central line appear stable. Stable lung volumes. Stable cardiac size and mediastinal contours. Residual peripheral and basilar opacity in the right lung is stable. No  pneumothorax. Left lower lobe atelectasis.  IMPRESSION: 1. One right chest tube removed, 1 remains. No pneumothorax identified. 2. Feeding tube placed, tip not included. 3. Stable right lung ventilation. Left lung remarkable for lower lobe atelectasis.   Electronically Signed   By: Lemmie Evens  [place] Crane M.D.   On: 12/10/2014 07:49   Dg Chest Port 1 View  12/09/2014   CLINICAL DATA:  Post VATS, empyema, history prostate cancer, hypertension, diabetes mellitus  EXAM: PORTABLE CHEST - 1 VIEW  COMPARISON:  Portable exam 0647 hours compared 12/08/2014  FINDINGS: RIGHT jugular central venous catheter with tip projecting over SVC.  Pair of RIGHT thoracostomy tubes unchanged.  Mild enlargement of cardiac silhouette.  Stable mediastinal contours and pulmonary vascularity.  Mild residual pleural-based opacity in the RIGHT hemi thorax with persisting consolidation in RIGHT lower lobe.  LEFT lung clear.  No pneumothorax.  IMPRESSION: Stable RIGHT thoracostomy tubes with persistent RIGHT lower lobe consolidation and mild residual pleural-based opacity.   Electronically Signed   By: Lavonia Dana M.D.   On: 12/09/2014 07:55   Dg Chest Port 1 View  12/08/2014   CLINICAL DATA:  Empyema  EXAM: PORTABLE CHEST - 1 VIEW  COMPARISON:  12/07/2014  FINDINGS: Three chest tubes on the right are unchanged in position. No pneumothorax. Right lower lobe infiltrate. Minimal right effusion.  Left lower lobe atelectasis has progressed.  Negative for heart failure. Right jugular catheter tip in the SVC. Endotracheal tube and NG tubes removed.  IMPRESSION: Three chest tubes remain on the right without pneumothorax. Right lower lobe consolidation remains.  Increase in left lower lobe atelectasis.  Endotracheal tube removed in the interval.   Electronically Signed   By: Franchot Gallo M.D.   On: 12/08/2014 08:03   Dg Chest Port 1 View  12/07/2014   CLINICAL DATA:  Empyema.  EXAM: PORTABLE CHEST - 1 VIEW  COMPARISON:  12/06/2014  FINDINGS: The endotracheal tube  tip is just below the clavicular heads. The orogastric tube reaches the stomach at least. Right IJ central line, tip at the distal SVC.  3 right-sided thoracostomy tubes are in stable position. There is no definitive pneumothorax. Compared to preoperative study, pleural effusion remains decreased. Right basilar pneumonia is stable. Streaky left lower lobe opacity has slowly increased over the past 2 days, consistent with atelectasis. No pulmonary edema.  IMPRESSION: 1. Stable positioning of tubes and central line. 2. Recent right empyema drainage with no pneumothorax or increasing pleural fluid. 3. Stable right basilar pneumonia and left basilar atelectasis.   Electronically Signed   By: Monte Fantasia M.D.   On: 12/07/2014 07:41   Portable Chest Xray  12/06/2014   CLINICAL DATA:  Hypoxia  EXAM: PORTABLE CHEST - 1 VIEW  COMPARISON:  December 05, 2014  FINDINGS: Endotracheal tube tip is 5.3 cm above the carina. Central catheter tip is in the superior cava. Nasogastric tube tip and side port are below the diaphragm. Chest tubes are again noted on the right, unchanged in position. There is no appreciable pneumothorax. There is slightly less airspace consolidation in the right lower lung zone compared to 1 day prior. There is a mild degree of effusion on the right, stable. The left lung is essentially clear. Heart size and pulmonary vascularity are normal. No adenopathy.  IMPRESSION: Tube and catheter positions as described without pneumothorax. Partial clearing of consolidation right base. No new opacity. Small right effusion. Left lung clear. No change in cardiac silhouette.   Electronically Signed   By: Lowella Grip III M.D.   On: 12/06/2014 07:42   Dg Chest Port 1 View  12/05/2014   CLINICAL DATA:  Acute respiratory failure, hypoxia.  EXAM: PORTABLE CHEST - 1 VIEW  COMPARISON:  12/05/2014  FINDINGS: Right chest tubes and remainder support  devices remain in place, unchanged. No pneumothorax. Interval placement  of the NG tube into the stomach. Patchy right lung airspace disease slightly worsened since prior study. No confluent opacity on the left. Heart is normal size.  IMPRESSION: Worsening right lung airspace disease. Right chest tubes remain in place without pneumothorax.   Electronically Signed   By: Rolm Baptise M.D.   On: 12/05/2014 16:36   Dg Chest Port 1 View  12/05/2014   CLINICAL DATA:  Pneumothorax.  EXAM: PORTABLE CHEST - 1 VIEW  COMPARISON:  Radiograph 12/04/2014, CT 12/04/2014, chest radiograph 12/02/2014  FINDINGS: 3 RIGHT chest tubes noted. There is interval decrease in volume of the pleural fluid in the RIGHT hemi thorax. No appreciable pneumothorax identified. RIGHT central venous line is in place with tip in the distal SVC. Endotracheal tube is 3.8 cm from carina. LEFT lung is clear.  IMPRESSION: 1. Decrease in pleural fluid in the RIGHT hemi thorax. 2. Three right chest tubes in place without pneumothorax. 3. Endotracheal tube and central venous line appear in good position.   Electronically Signed   By: Suzy Bouchard M.D.   On: 12/05/2014 14:09   Dg Abd Portable 1v  12/11/2014   CLINICAL DATA:  Nasogastric tube placement.  Initial encounter.  EXAM: PORTABLE ABDOMEN - 1 VIEW  COMPARISON:  Abdominal radiograph performed 12/09/2014, and chest radiograph performed earlier today at 7:10 a.m.  FINDINGS: The patient's enteric tube is noted ending overlying the third segment of the duodenum, at appropriate position.  The visualized bowel gas pattern is grossly unremarkable, with scattered air filled loops of small and large bowel seen.  A small right pleural effusion is noted, with right basilar airspace opacification. No free intra-abdominal air is identified, though evaluation for free air is limited on a single supine view.  Mild degenerative change is noted at the lower lumbar spine.  IMPRESSION: 1. Enteric tube noted ending overlying the third segment of the duodenum, at appropriate position. 2.  Small right pleural effusion, with right basilar airspace opacification, mildly more prominent than on the prior study.   Electronically Signed   By: Garald Balding M.D.   On: 12/11/2014 02:01   Dg Abd Portable 1v  12/09/2014   CLINICAL DATA:  Feeding tube placement .  EXAM: PORTABLE ABDOMEN - 1 VIEW  COMPARISON:  None.  FINDINGS: Feeding tube noted with its tip projected over the descending portion of the duodenum. No gastric distention. Degenerative changes lumbar spine. Vascular calcification.  IMPRESSION: Feeding tube noted with tip projected over the descending portion of the duodenum. No gastric distention noted.   Electronically Signed   By: [place]   On: 12/09/2014 13:09   Dg Swallowing Func-speech Pathology  12/04/2014    Objective Swallowing Evaluation:    Patient Details  Name: Logan May MRN: [id number] Date of Birth: 1946-02-22  Today's Date: 12/04/2014 Time: SLP Start Time (ACUTE ONLY): 1235-SLP Stop Time (ACUTE ONLY): 1258 SLP Time Calculation (min) (ACUTE ONLY): 23 min  Past Medical History:  Past Medical History  Diagnosis Date  . BPH (benign prostatic hypertrophy)   . Prostate cancer     implants   . Vitamin D deficiency     takes Vit D daily  . History of ETT 1999    Dr, Caleen Essex  . Anxiety     takes Valium daily as needed  . Hyperlipidemia     takes Simvastatin nightly  . Hypertension     takes Quinapril and Procardia  daily  . Headache(784.0)     occasionally  . Peripheral neuropathy   . Diabetes mellitus, type 2     but doesn't take any meds for it;diet controlled and exercise  . 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 Pneumonia 1960's?  . CAP (community acquired pneumonia) 12/02/2014   Past Surgical History:  Past Surgical History  Procedure Laterality Date  . Foot surgery  May 2002    Dr. Sharol Given   . Left foot casted 4-5 months    . Bilateral  eye laser surgery Bilateral 2000    "related to diabetes"  . Charcot foot left Left 12/15/01  . Laser eye surgery right  12/2002  . Eye surgery     . Tonsillectomy    . I&d extremity Left 09/05/2012    Procedure: IRRIGATION AND DEBRIDEMENT EXTREMITY;  Surgeon: Newt Minion, MD;  Location: [facility];  Service: Orthopedics;  Laterality: Left;   Excision Charcot Collapse Left Foot and Base 5th Metatarsal, Antibiotic  Beads  . Esophagogastroduodenoscopy      at least 57yrs ago  . 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 male with PMH of Diabetes  type 2 with Charcot foot supposedly diet-controlled, hypertension,  generalized anxiety disorder, history of atrial fibrillation not on  anticoagulation presumably secondary to falls. Patient presents to the  hospital with 3 weeks of worsening productive cough with purulent sputum.  Guaifenesin has been helpful, but has not resolved his cough. He also has  been having increased weakness with some mild dyspnea on exertion which is  improved with rest. Also noted concerns of very dry mouth and poor oral  hygiene. CXR 7/28 revealed diffuse opacification of RLL consistent with  PNA with associated pleural effusion. Bedside swallow eval ordered to aide  in ruling out aspiration.   No Data Recorded  Assessment / Plan / Recommendation CHL IP CLINICAL IMPRESSIONS 12/04/2014  Therapy Diagnosis Mild oral phase dysphagia;Moderate pharyngeal phase  dysphagia;Mild cervical esophageal phase dysphagia  Clinical Impression Pt currently demonstrating a mild oral/ moderate  pharyngeal dysphagia that appears sensorimotor in nature. Delay in swallow  initiation accompanied by reduced airway protection (reduced base of  tongue retraction and minimal epiglottic inversion) resulted in  penetration during the swallow x1 of thin liquids. Reduced epiglottic  inversion/ base of tongue retraction also resulted in moderate-severe  amounts of vallecular residuals  across consistencies which led to  penetration post-swallow x1 of thin liquids- pt had a weak throat clear  response and cleared remaining penetrates with cued cough. Pt is not able  to control sip size and large sip of nectar thick liquids resulted in  significant residuals in vallecula, about half of which were cleared with  multiple swallows and cough/ throat clears (cued- pt did not sense).  Nectar thick liquids by teaspoon accompanied by chin tuck reduced  residuals and controlled sip size. Minimal backflow to cervical esophagus  observed across consistencies. Recommend initiating dysphagia 2 diet,  nectar thick liquids by teaspoon, meds crushed in puree, full supervision  to cue pt to tuck chin, swallow 2x with chin down. Also have pt sit  upright 30 minutes after meal. Pt was able to follow instructions for  strategies during MBS with a visual cue- hopeful that this will  continue  at bedside as well but decreased cognitive status continues to put pt at  increased risk of aspiration. SLP will continue to follow closely for diet  tolerance/ consider advancement or repeat MBS as cognitive status  improves.      CHL IP TREATMENT RECOMMENDATION 12/04/2014  Treatment Recommendations Therapy as outlined in treatment plan below     CHL IP DIET RECOMMENDATION 12/04/2014  SLP Diet Recommendations Dysphagia 2 (Fine chop);Nectar  Liquid Administration via (None)  Medication Administration Crushed with puree  Compensations Slow rate;Small sips/bites;Multiple dry swallows after each  bite/sip;Clear throat intermittently;Chin tuck  Postural Changes and/or Swallow Maneuvers (None)     CHL IP OTHER RECOMMENDATIONS 12/04/2014  Recommended Consults (None)  Oral Care Recommendations Oral care BID  Other Recommendations Order thickener from pharmacy;Clarify dietary  restrictions     No flowsheet data found.   CHL IP FREQUENCY AND DURATION 12/04/2014  Speech Therapy Frequency (ACUTE ONLY) min 2x/week  Treatment Duration 2 weeks      Pertinent Vitals/Pain none    SLP Swallow Goals No flowsheet data found.  No flowsheet data found.    CHL IP REASON FOR REFERRAL 12/04/2014  Reason for Referral Objectively evaluate swallowing function     CHL IP ORAL PHASE 12/04/2014  Lips (None)  Tongue (None)  Mucous membranes (None)  Nutritional status (None)  Other (None)  Oxygen therapy (None)  Oral Phase Impaired  Oral - Pudding Teaspoon (None)  Oral - Pudding Cup (None)  Oral - Honey Teaspoon (None)  Oral - Honey Cup (None)  Oral - Honey Syringe (None)  Oral - Nectar Teaspoon (None)  Oral - Nectar Cup (None)  Oral - Nectar Straw (None)  Oral - Nectar Syringe (None)  Oral - Ice Chips (None)  Oral - Thin Teaspoon (None)  Oral - Thin Cup (None)  Oral - Thin Straw (None)  Oral - Thin Syringe (None)  Oral - Puree (None)  Oral - Mechanical Soft (None)  Oral - Regular (None)  Oral - Multi-consistency (None)  Oral - Pill (None)  Oral Phase - Comment (None)      CHL IP PHARYNGEAL PHASE 12/04/2014  Pharyngeal Phase Impaired  Pharyngeal - Pudding Teaspoon (None)  Penetration/Aspiration details (pudding teaspoon) (None)  Pharyngeal - Pudding Cup (None)  Penetration/Aspiration details (pudding cup) (None)  Pharyngeal - Honey Teaspoon (None)  Penetration/Aspiration details (honey teaspoon) (None)  Pharyngeal - Honey Cup (None)  Penetration/Aspiration details (honey cup) (None)  Pharyngeal - Honey Syringe (None)  Penetration/Aspiration details (honey syringe) (None)  Pharyngeal - Nectar Teaspoon (None)  Penetration/Aspiration details (nectar teaspoon) (None)  Pharyngeal - Nectar Cup (None)  Penetration/Aspiration details (nectar cup) (None)  Pharyngeal - Nectar Straw (None)  Penetration/Aspiration details (nectar straw) (None)  Pharyngeal - Nectar Syringe (None)  Penetration/Aspiration details (nectar syringe) (None)  Pharyngeal - Ice Chips (None)  Penetration/Aspiration details (ice chips) (None)  Pharyngeal - Thin Teaspoon (None)  Penetration/Aspiration details (thin  teaspoon) (None)  Pharyngeal - Thin Cup (None)  Penetration/Aspiration details (thin cup) (None)  Pharyngeal - Thin Straw (None)  Penetration/Aspiration details (thin straw) (None)  Pharyngeal - Thin Syringe (None)  Penetration/Aspiration details (thin syringe') (None)  Pharyngeal - Puree (None)  Penetration/Aspiration details (puree) (None)  Pharyngeal - Mechanical Soft (None)  Penetration/Aspiration details (mechanical soft) (None)  Pharyngeal - Regular (None)  Penetration/Aspiration details (regular) (None)  Pharyngeal - Multi-consistency (None)  Penetration/Aspiration details (multi-consistency) (None)  Pharyngeal - Pill (None)  Penetration/Aspiration details (pill) (None)  Pharyngeal Comment (None)  CHL IP CERVICAL ESOPHAGEAL PHASE 12/04/2014  Cervical Esophageal Phase Impaired  Pudding Teaspoon (None)  Pudding Cup (None)  Honey Teaspoon (None)  Honey Cup (None)  Honey Straw (None)  Nectar Teaspoon Reduced cricopharyngeal relaxation;Esophageal backflow  into cervical esophagus  Nectar Cup Reduced cricopharyngeal relaxation;Esophageal backflow into  cervical esophagus  Nectar Straw (None)  Nectar Sippy Cup (None)  Thin Teaspoon (None)  Thin Cup Reduced cricopharyngeal relaxation;Esophageal backflow into  cervical esophagus  Thin Straw (None)  Thin Sippy Cup (None)  Cervical Esophageal Comment (None)    No flowsheet data found.         Kern Reap, MA, CCC-SLP 12/04/2014, 1:55 PM [id number]    Thurnell Lose, MD  [facility] Pager:[phone]  If 7PM-7AM, please contact night-coverage [email or web address] Password [email or web address] 12/14/2014, 11:45 AM   LOS: 12 days

## 2014-12-14 NOTE — Clinical Social Work Note (Signed)
Clinical Social Worker continuing to follow patient and family for support and discharge planning needs.  CSW spoke with patient wife at bedside to further discuss available bed offers.  Patient wife states that she has the bed offers at home and will bring them tomorrow to provide a decision.  CSW to follow up with patient wife and remains available for support as needed.  Barbette Or, [place]

## 2014-12-15 ENCOUNTER — Inpatient Hospital Stay (HOSPITAL_COMMUNITY): Payer: Medicare Other

## 2014-12-15 LAB — BASIC METABOLIC PANEL
ANION GAP: 8 (ref 5–15)
BUN: 7 mg/dL (ref 6–20)
CHLORIDE: 105 mmol/L (ref 101–111)
CO2: 29 mmol/L (ref 22–32)
Calcium: 7.4 mg/dL — ABNORMAL LOW (ref 8.9–10.3)
Creatinine, Ser: 0.94 mg/dL (ref 0.61–1.24)
GFR calc Af Amer: >60 mL/min (ref >60)
GFR calc non Af Amer: >60 mL/min (ref >60)
GLUCOSE: 106 mg/dL — AB (ref 65–99)
POTASSIUM: 3.4 mmol/L — AB (ref 3.5–5.1)
SODIUM: 142 mmol/L (ref 135–145)

## 2014-12-15 LAB — GLUCOSE, CAPILLARY
Glucose-Capillary: 108 mg/dL — ABNORMAL HIGH (ref 65–99)
Glucose-Capillary: 112 mg/dL — ABNORMAL HIGH (ref 65–99)
Glucose-Capillary: 119 mg/dL — ABNORMAL HIGH (ref 65–99)
Glucose-Capillary: 120 mg/dL — ABNORMAL HIGH (ref 65–99)
Glucose-Capillary: 135 mg/dL — ABNORMAL HIGH (ref 65–99)
Glucose-Capillary: 135 mg/dL — ABNORMAL HIGH (ref 65–99)

## 2014-12-15 LAB — MAGNESIUM: Magnesium: 1.7 mg/dL (ref 1.7–2.4)

## 2014-12-15 MED ORDER — POTASSIUM CHLORIDE 10 MEQ/100ML IV SOLN
10.0000 meq | INTRAVENOUS | Status: AC
  Administered 2014-12-15 (×4): 10 meq via INTRAVENOUS
  Filled 2014-12-15 (×4): qty 100

## 2014-12-15 MED ORDER — MAGNESIUM SULFATE IN D5W 10-5 MG/ML-% IV SOLN
1.0000 g | Freq: Once | INTRAVENOUS | Status: AC
  Administered 2014-12-15: 1 g via INTRAVENOUS
  Filled 2014-12-15: qty 100

## 2014-12-15 MED ORDER — FUROSEMIDE 10 MG/ML IJ SOLN
20.0000 mg | Freq: Once | INTRAMUSCULAR | Status: AC
  Administered 2014-12-15: 20 mg via INTRAVENOUS
  Filled 2014-12-15: qty 2

## 2014-12-15 NOTE — Progress Notes (Addendum)
PATIENT DETAILS Name: Logan May Age: 69 y.o. 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 PCCM was consulted, underwent thoracocentesis on 7/30-pleural fluid was consistent with an empyema. Subsequently cardiothoracic surgery was consulted, and a right sided VATS, drainage of empyema and visceral/pleural decortication was done on 7/31. Patient subsequently developed respiratory failure and required mechanical ventilation till 8/3. ICU course was complicated by development of atrial fibrillation with RVR requiring amiodarone infusion and severe delirium thought to be secondary to alcohol withdrawal. Upon clinical stability, patient was transferred to the hospitalist service on 8/6.  Subjective:  Is confused but denies any headache, no chest or abdominal pain. Is following basic commands. Denies any focal weakness.  Assessment/Plan:  Severe sepsis from right-sided pneumonia and empyema likely due to underlying micro-aspiration: With acute hypoxic respiratory failure requiring intubation.  Sepsis pathophysiology has resolved following VATS/decortication. He required brief intubation and ICU stay was extubated on 12/07/2014. Blood culture neg, but tissue culture positive for microaerophilic streptococcus will taper down to Rocephin at this point.  He continues to exhibit signs of aspiration, despite NG tube feeding patient is clinically aspirating and I suspect he is even aspirating his oral secretions, therapy on board, I discontinued NG  tube on 12/14/2014, Had added anti-suction and pulmonary toiletry, he shows some clinical improvement since NG tube was discontinued and admitted toiletry was initiated.  I will request speech to reevaluate however I think his dysphagia is a long-standing problem and will request palliative care to address with patient and wife long-term goals of care i.e. PEG tube was as comfort measures.  Will hydrate with IV fluids for the next few days, repeat swallow eval. Long-term prognosis appears to be poor.  Continue supportive care for respiratory failure with oxygen nebulizer treatments and pulmonary toiletry.   Addendum wife requested patient be transferred to [facility], discussed the case with [facility] critical care physician Dr. Marianna Payment. On 12/15/2014 1:50 PM. Who agrees that besides feeding tube versus comfort measures he has nothing to offer and agrees with management at this facility.    Right sided Empyema I suspect from long-standing microaspiration: underwent VATS, drainage of empyema and visceral/pleural decortication on 7/31.Tissue culture 7/31 positive for Microaerophilic streptococci,  AFB culture/fungal culture negative so far. ABX adjusted based on cultures - Abx stop date 8/13   Severe Dysphagia: Suspect long-term microaspiration, speech on board, despite having an NG tube patient continued to have clinical evidence of ongoing aspiration of oral secretions and GI content regurgitation. NG tube was removed on 12/14/2014. Shows improvement on 12/15/2014. We will request speech to reevaluate.   Acute Urinary Retention:underwent Flexible cytoscopy and passage of foley catheter by Dr Gaynelle Arabian on 7/31   Afib BSJ:GGEZMOQH Amiodarone gtt for rate control, now on prn lopressor. Felt to be a poor candidate for long term anticoagulation-although has a CHADS2Vasc SCORE of  3. Stared on ASA   ETOH withdrawal with delirium and metabolic encephalopathy:required Precedex gtt while in [place] less  agitated-still somewhat confused-continue Ativan per CIWA protocol. Counseled to quit alcohol.  Enterococcal UTI: Seen on urine culture 7/29-this is pansensitive. Has been treated with Zosyn.   Left upper Ext swelling: stable Doppler   Anemia: likely secondary to  critical illness. Follow CBC and transfuse prn if HB<7   Hx of COPD with emphysema:continue bronchodilators-lungs clear   Dyslipidemia:continue Statin   Severe Deconditioning:PT on board-SNF on discharge   Type 2 DM: CBG's stable with SSI. A1C 6.8 on 7/29  CBG (last 3)   Recent Labs  12/15/14 12/15/14 0445 12/15/14 0749  GLUCAP 135* 108* 119*     Hypokalemia and hypomagnesemia. Both replaced.     Disposition: Remain inpatient-SNF on discharge   Antimicrobial agents  See below  Anti-infectives    Start     Dose/Rate Route Frequency Ordered Stop   12/12/14 1600  cefTRIAXone (ROCEPHIN) 1 g in dextrose 5 % 50 mL IVPB     1 g 100 mL/hr over 30 Minutes Intravenous Every 24 hours 12/12/14 1451     12/12/14 1200  levofloxacin (LEVAQUIN) IVPB 750 mg  Status:  Discontinued     750 mg 100 mL/hr over 90 Minutes Intravenous Every 24 hours 12/12/14 1042 12/12/14 1451   12/10/14 2100  piperacillin-tazobactam (ZOSYN) IVPB 3.375 g  Status:  Discontinued     3.375 g 12.5 mL/hr over 240 Minutes Intravenous Every 8 hours 12/10/14 1432 12/12/14 1035   12/05/14 0900  vancomycin (VANCOCIN) IVPB 1000 mg/200 mL premix  Status:  Discontinued     1,000 mg 200 mL/hr over 60 Minutes Intravenous Every 12 hours 12/04/14 2003 12/09/14 0736   12/05/14 0600  vancomycin (VANCOCIN) IVPB 1000 mg/200 mL premix  Status:  Discontinued     1,000 mg 200 mL/hr over 60 Minutes Intravenous To Surgery 12/04/14 1604 12/04/14 2000   12/04/14 2100  piperacillin-tazobactam (ZOSYN) IVPB 3.375 g  Status:  Discontinued     3.375 g 12.5 mL/hr over 240 Minutes Intravenous Every 8 hours 12/04/14 2003 12/10/14 1432   12/04/14 2100  vancomycin  (VANCOCIN) 1,500 mg in sodium chloride 0.9 % 500 mL IVPB     1,500 mg 250 mL/hr over 120 Minutes Intravenous  Once 12/04/14 2003 12/04/14 2348   12/03/14 1330  levofloxacin (LEVAQUIN) IVPB 750 mg  Status:  Discontinued     750 mg 100 mL/hr over 90 Minutes Intravenous Every 24 hours 12/02/14 1514 12/04/14 1951   12/02/14 1130  cefTRIAXone (ROCEPHIN) 1 g in dextrose 5 % 50 mL IVPB     1 g 100 mL/hr over 30 Minutes Intravenous  Once 12/02/14 1118 12/02/14 1246   12/02/14 1130  azithromycin (ZITHROMAX) 500 mg in dextrose 5 % 250 mL IVPB     500 mg 250 mL/hr over 60 Minutes Intravenous  Once 12/02/14 1118 12/02/14 1429      DVT Prophylaxis: Prophylactic Lovenox   Code Status: Full code   Family Communication Wife bedside 12-12-14  Procedures: 7/30>>Thoracocentesis 7/31>>VATS, drainage of empyema and visceral/pleural decortication(Dr Hendrickson) 7/31>> Flexible cytoscopy and passage of foley catheter (Dr Gaynelle Arabian) 7/31>>8/3 ETT 12/12/2014 bilateral upper extremity venous duplex - no DVT SVT  CONSULTS:   pulmonary/intensive care, urology and CTVS, Pall Care  Time spent  40 minutes-Greater than 50% of this time was spent in counseling, explanation of diagnosis, planning of further management, and coordination of care.  MEDICATIONS: Scheduled Meds: . acidophilus  2 capsule Oral Daily  . aspirin EC  81 mg Oral Daily  . atorvastatin  20 mg Oral q1800  . bisacodyl  10 mg Oral Daily  .  cefTRIAXone (ROCEPHIN)  IV  1 g Intravenous Q24H  . cholecalciferol  2,000 Units Oral Once per day on Mon Tue Wed Thu Fri  . cholecalciferol  4,000 Units Oral Once per day on Sun Sat  . enoxaparin (LOVENOX) injection  40 mg Subcutaneous Q24H  . folic acid  1 mg Intravenous Daily  . furosemide  20 mg Intravenous Once  . insulin aspart  0-15 Units Subcutaneous 6 times per day  . ipratropium-albuterol  3 mL Nebulization TID  . magnesium sulfate 1 - 4 g bolus IVPB  1 g Intravenous Once  .  metoprolol tartrate  25 mg Oral BID  . potassium chloride  10 mEq Intravenous Q1 Hr x 4  . scopolamine  1 patch Transdermal Q72H  . senna-docusate  1 tablet Oral QHS  . thiamine IV  100 mg Intravenous Daily   Continuous Infusions: . dextrose 5 % and 0.45% NaCl 50 mL/hr at 12/14/14 1210  . feeding supplement (VITAL AF 1.2 CAL) 1,000 mL (12/12/14 1644)   PRN Meds:.acetaminophen, albuterol, fentaNYL (SUBLIMAZE) injection, Gerhardt's butt cream, ipratropium-albuterol, LORazepam, metoprolol, ondansetron (ZOFRAN) IV, sodium chloride    PHYSICAL EXAM: Vital signs in last 24 hours: Filed Vitals:   12/15/14 0004 12/15/14 0447 12/15/14 0653 12/15/14 1037  BP: 153/81 132/64  127/76  Pulse: 110 104  105  Temp: 98.6 F (37 C) 98.3 F (36.8 C)  99.8 F (37.7 C)  TempSrc: Oral Oral  Oral  Resp: 18 19  22   Height:      Weight:   85 kg (187 lb 6.3 oz)   SpO2: 96% 100%  98%    Weight change: 2.2 kg (4 lb 13.6 oz) Filed Weights   12/13/14 0500 12/14/14 0500 12/15/14 0653  Weight: 85.6 kg (188 lb 11.4 oz) 82.8 kg (182 lb 8.7 oz) 85 kg (187 lb 6.3 oz)   Body mass index is 26.15 kg/(m^2).   Gen Exam: Awake but confused-however follows some commands. Panda tube in place Neck: Supple, No JVD.  Chest: B/L Clear anteriorly CVS: S1 S2 Regular, no murmurs.  Abdomen: soft, BS +, non tender Extremities: no edema, lower extremities warm to touch. Neurologic: Non Focal-but with gen weakness  Intake/Output from previous day:  Intake/Output Summary (Last 24 hours) at 12/15/14 1052 Last data filed at 12/15/14 0900  Gross per 24 hour  Intake 844.17 ml  Output   2075 ml  Net -1230.83 ml     LAB RESULTS: CBC  Recent Labs Lab 12/09/14 0409 12/10/14 0416 12/11/14 0515 12/13/14 0458 12/14/14 0520  WBC 8.6 7.9 9.9 11.2* 10.9*  HGB 8.0* 8.0* 8.4* 8.1* 8.0*  HCT 24.9* 25.2* 26.5* 25.8* 25.5*  PLT 366 353 429* 428* 405*  MCV 88.6 89.4 89.2 89.9 89.8  MCH 28.5 28.4 28.3 28.2 28.2  MCHC  32.1 31.7 31.7 31.4 31.4  RDW 15.2 15.7* 15.7* 15.9* 16.0*    Chemistries   Recent Labs Lab 12/10/14 0416 12/10/14 2225 12/11/14 0515 12/13/14 0458 12/14/14 0520 12/15/14 0612  NA 141  --  143 141 141 142  K 3.6  --  3.7 3.3* 3.3* 3.4*  CL 111  --  115* 109 106 105  CO2 22  --  23 26 27 29   GLUCOSE 174*  --  167* 203* 206* 106*  BUN 6  --  8 8 10 7   CREATININE 0.99  --  0.98 0.93 1.02 0.94  CALCIUM 7.1*  --  7.0* 7.3* 7.5* 7.4*  MG 1.3* 2.4  --  1.4* 1.6* 1.7    CBG:  Recent Labs Lab 12/14/14 1621 12/14/14 2020 12/15/14 12/15/14 0445 12/15/14 0749  GLUCAP 118* 116* 135* 108* 119*    GFR Estimated Creatinine Clearance: 77.9 mL/min (by C-G formula based on Cr of 0.94).  Coagulation profile No results for input(s): INR, PROTIME in the last 168 hours.  Cardiac Enzymes No results for input(s): CKMB, TROPONINI, MYOGLOBIN in the last 168 hours.  Invalid input(s): CK  Invalid input(s): POCBNP No results for input(s): DDIMER in the last 72 hours. No results for input(s): HGBA1C in the last 72 hours. No results for input(s): CHOL, HDL, LDLCALC, TRIG, CHOLHDL, LDLDIRECT in the last 72 hours. No results for input(s): TSH, T4TOTAL, T3FREE, THYROIDAB in the last 72 hours.  Invalid input(s): FREET3 No results for input(s): VITAMINB12, FOLATE, FERRITIN, TIBC, IRON, RETICCTPCT in the last 72 hours. No results for input(s): LIPASE, AMYLASE in the last 72 hours.  Urine Studies No results for input(s): UHGB, CRYS in the last 72 hours.  Invalid input(s): UACOL, UAPR, USPG, UPH, UTP, UGL, UKET, UBIL, UNIT, UROB, ULEU, UEPI, UWBC, URBC, UBAC, CAST, UCOM, BILUA  MICROBIOLOGY: No results found for this or any previous visit (from the past 240 hour(s)).  RADIOLOGY STUDIES/RESULTS: Dg Chest 1 View  12/04/2014   CLINICAL DATA:  Post right thoracentesis  EXAM: CHEST  1 VIEW  COMPARISON:  12/02/2014  FINDINGS: Diffuse right lung airspace disease again noted, slightly improved.  Decreasing right effusion following thoracentesis. No pneumothorax. Heart is borderline in size. Nodular density projecting over the left lung base is felt represent nipple shadow. Left lung is clear. No acute bony abnormality.  IMPRESSION: Decreasing right effusion and diffuse right lung airspace disease. No pneumothorax.   Electronically Signed   By: Rolm Baptise M.D.   On: 12/04/2014 13:10   Dg Chest 2 View  12/02/2014   CLINICAL DATA:  Altered level of consciousness.  EXAM: CHEST  2 VIEW  COMPARISON:  Sep 04, 2012.  FINDINGS: The heart size and mediastinal contours are within normal limits. No pneumothorax is noted. Left lung is clear. There is now noted diffuse opacification of the right lower lobe most consistent with pneumonia with associated pleural effusion. The visualized skeletal structures are unremarkable.  IMPRESSION: Diffuse opacification of the right lower lobe most consistent with pneumonia with associated pleural effusion. Follow-up radiographs are recommended.   Electronically Signed   By: Marijo Conception, M.D.   On: 12/02/2014 10:57   Dg Abd 1 View  12/12/2014   CLINICAL DATA:  Feeding tube placement  EXAM: ABDOMEN - 1 VIEW  COMPARISON:  12/10/2014  FINDINGS: Tip of feeding tube projects over proximal stomach.  Air-filled upper normal caliber loops of small bowel.  Scattered colonic gas.  No evidence of bowel obstruction or dilatation.  Scattered degenerative disc disease changes lumbar spine.  Foley catheter in urinary bladder.  IMPRESSION: Tip of feeding tube projects over proximal stomach.   Electronically Signed   By: Lavonia Dana M.D.   On: 12/12/2014 10:05   Ct Chest Wo Contrast  12/04/2014   CLINICAL DATA:  Right hemithorax opacification, assess hemothorax, effusion and loculation. Review of electronic records demonstrates history of productive cough and shortness of breath for 3 weeks. Recent falls.  EXAM: CT CHEST WITHOUT CONTRAST  TECHNIQUE: Multidetector CT imaging of the chest  was performed following the standard protocol without IV contrast.  COMPARISON:  Chest radiograph 12/02/2014  FINDINGS: Large volume of loculated right pleural fluid. Within the  right lower hemithorax are multiple foci of air within the large volume pleural fluid raising concern for empyema. There is mass effect on the adjacent bronchi, with compressive atelectasis of the dependent right upper lobe and consolidation in the right middle lobe with air bronchograms. The origin of the right lower lobe bronchus is not well-defined, and no aerated right lower lobe lung is confidently identified. Pleural fluid is heterogeneous in density.  There is a small left pleural effusion that appears simple fluid in density. Adjacent compressive atelectasis in the lower lobe. The left upper lobe is clear.  Heart at the upper limits of normal in size. There are dense coronary artery calcifications. No definite pericardial fluid. Prominent small superior mediastinal lymph nodes measuring 9 mm short axis dimension. Limited assessment for hilar adenopathy. Mild atherosclerosis of normal caliber thoracic aorta.  No definite acute abnormality in the included upper abdomen, perinephric stranding is partially included.  There are no acute rib fractures. Remote nondisplaced lower right rib fractures with well-formed callus. Age related degenerative change throughout spine. No blastic or destructive lytic osseous lesions.  IMPRESSION: 1. Large partially loculated heterogeneous right pleural effusion. In the lower portion are multiple foci of air. Findings are most concerning for empyema. There is consolidation or atelectasis of the entire right lower lobe, consolidation with air bronchograms in the right middle lobe, and compressive atelectasis of the adjacent right upper lobe. Hemothorax is felt less likely, given the right lower rib fractures appear remote. 2. Small left pleural effusion with adjacent compressive atelectasis. 3. Dense  coronary artery calcifications.   Electronically Signed   By: Jeb Levering M.D.   On: 12/04/2014 02:42   Dg Chest Port 1 View  12/14/2014   CLINICAL DATA:  Pneumonia.  EXAM: PORTABLE CHEST - 1 VIEW  COMPARISON:  12/11/2014  FINDINGS: Right jugular catheter tip in the SVC unchanged. Feeding tube tip in the proximal stomach.  Right lower lobe airspace disease unchanged with small right effusion  Left lower lobe consolidation unchanged, with left effusion  Cardiac enlargement with mild vascular congestion.  IMPRESSION: Right lower lobe infiltrate and effusion, worrisome for pneumonia  Left lower lobe consolidation and effusion possibly atelectasis or pneumonia  Pulmonary vascular congestion.   Electronically Signed   By: Franchot Gallo M.D.   On: 12/14/2014 08:00   Dg Chest Port 1 View  12/11/2014   CLINICAL DATA:  69 year old male with history of empyema.  EXAM: PORTABLE CHEST - 1 VIEW  COMPARISON:  Chest x-ray 12/10/2014.  FINDINGS: Previously noted right-sided chest tube has been removed. No appreciable right pneumothorax. Right IJ catheter with tip terminating in the distal superior vena cava. Feeding tube extending into the upper abdomen, with tip below the lower margin of the image. Lung volumes are slightly low. Bibasilar opacities may reflect areas of atelectasis and/or consolidation. Small to moderate bilateral pleural effusions. Cephalization of pulmonary vasculature. Mild cardiomegaly. Upper mediastinal contours are slightly distorted by patient positioning.  IMPRESSION: 1. Support apparatus, as above. 2. No definite right pneumothorax following right-sided chest tube removal. 3. Bibasilar opacities may reflect areas of atelectasis and/or consolidation, with superimposed small to moderate bilateral pleural effusions. 4. Cardiomegaly with pulmonary venous congestion, but no frank pulmonary edema.   Electronically Signed   By: Vinnie Langton M.D.   On: 12/11/2014 08:36   Dg Chest Port 1  View  12/10/2014   CLINICAL DATA:  69 year old male status post VATS for empyema. Initial encounter.  EXAM: PORTABLE CHEST - 1 VIEW  COMPARISON:  12/09/2014 and earlier.  FINDINGS: Portable AP semi upright view at 0710 hrs. Enteric feeding tube is been placed, tip not included. 1 of the 2 right chest tubes has been removed. Single residual right side chest tube and right IJ central line appear stable. Stable lung volumes. Stable cardiac size and mediastinal contours. Residual peripheral and basilar opacity in the right lung is stable. No pneumothorax. Left lower lobe atelectasis.  IMPRESSION: 1. One right chest tube removed, 1 remains. No pneumothorax identified. 2. Feeding tube placed, tip not included. 3. Stable right lung ventilation. Left lung remarkable for lower lobe atelectasis.   Electronically Signed   By: Genevie Ann M.D.   On: 12/10/2014 07:49   Dg Chest Port 1 View  12/09/2014   CLINICAL DATA:  Post VATS, empyema, history prostate cancer, hypertension, diabetes mellitus  EXAM: PORTABLE CHEST - 1 VIEW  COMPARISON:  Portable exam 0647 hours compared 12/08/2014  FINDINGS: RIGHT jugular central venous catheter with tip projecting over SVC.  Pair of RIGHT thoracostomy tubes unchanged.  Mild enlargement of cardiac silhouette.  Stable mediastinal contours and pulmonary vascularity.  Mild residual pleural-based opacity in the RIGHT hemi thorax with persisting consolidation in RIGHT lower lobe.  LEFT lung clear.  No pneumothorax.  IMPRESSION: Stable RIGHT thoracostomy tubes with persistent RIGHT lower lobe consolidation and mild residual pleural-based opacity.   Electronically Signed   By: Lavonia Dana M.D.   On: 12/09/2014 07:55   Dg Chest Port 1 View  12/08/2014   CLINICAL DATA:  Empyema  EXAM: PORTABLE CHEST - 1 VIEW  COMPARISON:  12/07/2014  FINDINGS: Three chest tubes on the right are unchanged in position. No pneumothorax. Right lower lobe infiltrate. Minimal right effusion.  Left lower lobe atelectasis has  progressed.  Negative for heart failure. Right jugular catheter tip in the SVC. Endotracheal tube and NG tubes removed.  IMPRESSION: Three chest tubes remain on the right without pneumothorax. Right lower lobe consolidation remains.  Increase in left lower lobe atelectasis.  Endotracheal tube removed in the interval.   Electronically Signed   By: Franchot Gallo M.D.   On: 12/08/2014 08:03   Dg Chest Port 1 View  12/07/2014   CLINICAL DATA:  Empyema.  EXAM: PORTABLE CHEST - 1 VIEW  COMPARISON:  12/06/2014  FINDINGS: The endotracheal tube tip is just below the clavicular heads. The orogastric tube reaches the stomach at least. Right IJ central line, tip at the distal SVC.  3 right-sided thoracostomy tubes are in stable position. There is no definitive pneumothorax. Compared to preoperative study, pleural effusion remains decreased. Right basilar pneumonia is stable. Streaky left lower lobe opacity has slowly increased over the past 2 days, consistent with atelectasis. No pulmonary edema.  IMPRESSION: 1. Stable positioning of tubes and central line. 2. Recent right empyema drainage with no pneumothorax or increasing pleural fluid. 3. Stable right basilar pneumonia and left basilar atelectasis.   Electronically Signed   By: Monte Fantasia M.D.   On: 12/07/2014 07:41   Portable Chest Xray  12/06/2014   CLINICAL DATA:  Hypoxia  EXAM: PORTABLE CHEST - 1 VIEW  COMPARISON:  December 05, 2014  FINDINGS: Endotracheal tube tip is 5.3 cm above the carina. Central catheter tip is in the superior cava. Nasogastric tube tip and side port are below the diaphragm. Chest tubes are again noted on the right, unchanged in position. There is no appreciable pneumothorax. There is slightly less airspace consolidation in the right lower lung zone compared  to 1 day prior. There is a mild degree of effusion on the right, stable. The left lung is essentially clear. Heart size and pulmonary vascularity are normal. No adenopathy.  IMPRESSION:  Tube and catheter positions as described without pneumothorax. Partial clearing of consolidation right base. No new opacity. Small right effusion. Left lung clear. No change in cardiac silhouette.   Electronically Signed   By: Lowella Grip III M.D.   On: 12/06/2014 07:42   Dg Chest Port 1 View  12/05/2014   CLINICAL DATA:  Acute respiratory failure, hypoxia.  EXAM: PORTABLE CHEST - 1 VIEW  COMPARISON:  12/05/2014  FINDINGS: Right chest tubes and remainder support devices remain in place, unchanged. No pneumothorax. Interval placement of the NG tube into the stomach. Patchy right lung airspace disease slightly worsened since prior study. No confluent opacity on the left. Heart is normal size.  IMPRESSION: Worsening right lung airspace disease. Right chest tubes remain in place without pneumothorax.   Electronically Signed   By: Rolm Baptise M.D.   On: 12/05/2014 16:36   Dg Chest Port 1 View  12/05/2014   CLINICAL DATA:  Pneumothorax.  EXAM: PORTABLE CHEST - 1 VIEW  COMPARISON:  Radiograph 12/04/2014, CT 12/04/2014, chest radiograph 12/02/2014  FINDINGS: 3 RIGHT chest tubes noted. There is interval decrease in volume of the pleural fluid in the RIGHT hemi thorax. No appreciable pneumothorax identified. RIGHT central venous line is in place with tip in the distal SVC. Endotracheal tube is 3.8 cm from carina. LEFT lung is clear.  IMPRESSION: 1. Decrease in pleural fluid in the RIGHT hemi thorax. 2. Three right chest tubes in place without pneumothorax. 3. Endotracheal tube and central venous line appear in good position.   Electronically Signed   By: Suzy Bouchard M.D.   On: 12/05/2014 14:09   Dg Abd Portable 1v  12/11/2014   CLINICAL DATA:  Nasogastric tube placement.  Initial encounter.  EXAM: PORTABLE ABDOMEN - 1 VIEW  COMPARISON:  Abdominal radiograph performed 12/09/2014, and chest radiograph performed earlier today at 7:10 a.m.  FINDINGS: The patient's enteric tube is noted ending overlying the  third segment of the duodenum, at appropriate position.  The visualized bowel gas pattern is grossly unremarkable, with scattered air filled loops of small and large bowel seen.  A small right pleural effusion is noted, with right basilar airspace opacification. No free intra-abdominal air is identified, though evaluation for free air is limited on a single supine view.  Mild degenerative change is noted at the lower lumbar spine.  IMPRESSION: 1. Enteric tube noted ending overlying the third segment of the duodenum, at appropriate position. 2. Small right pleural effusion, with right basilar airspace opacification, mildly more prominent than on the prior study.   Electronically Signed   By: Garald Balding M.D.   On: 12/11/2014 02:01   Dg Abd Portable 1v  12/09/2014   CLINICAL DATA:  Feeding tube placement .  EXAM: PORTABLE ABDOMEN - 1 VIEW  COMPARISON:  None.  FINDINGS: Feeding tube noted with its tip projected over the descending portion of the duodenum. No gastric distention. Degenerative changes lumbar spine. Vascular calcification.  IMPRESSION: Feeding tube noted with tip projected over the descending portion of the duodenum. No gastric distention noted.   Electronically Signed   By: Marcello Moores  Register   On: 12/09/2014 13:09   Dg Swallowing Func-speech Pathology  12/04/2014    Objective Swallowing Evaluation:    Patient Details  Name: Logan May MRN: [id number]  Date of Birth: Dec 24, 1945  Today's Date: 12/04/2014 Time: SLP Start Time (ACUTE ONLY): 1235-SLP Stop Time (ACUTE ONLY): 1258 SLP Time Calculation (min) (ACUTE ONLY): 23 min  Past Medical History:  Past Medical History  Diagnosis Date  . BPH (benign prostatic hypertrophy)   . Prostate cancer     implants   . Vitamin D deficiency     takes Vit D daily  . History of ETT 1999    Dr, Caleen Essex  . Anxiety     takes Valium daily as needed  . Hyperlipidemia     takes Simvastatin nightly  . Hypertension     takes Quinapril and Procardia daily  .  Headache(784.0)     occasionally  . Peripheral neuropathy   . Diabetes mellitus, type 2     but doesn't take any meds for it;diet controlled and exercise  . 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 Pneumonia 1960's?  . CAP (community acquired pneumonia) 12/02/2014   Past Surgical History:  Past Surgical History  Procedure Laterality Date  . Foot surgery  May 2002    Dr. Sharol Given   . Left foot casted 4-5 months    . Bilateral  eye laser surgery Bilateral 2000    "related to diabetes"  . Charcot foot left Left 12/15/01  . Laser eye surgery right  12/2002  . Eye surgery    . Tonsillectomy    . I&d extremity Left 09/05/2012    Procedure: IRRIGATION AND DEBRIDEMENT EXTREMITY;  Surgeon: Newt Minion, MD;  Location: [facility];  Service: Orthopedics;  Laterality: Left;   Excision Charcot Collapse Left Foot and Base 5th Metatarsal, Antibiotic  Beads  . Esophagogastroduodenoscopy      at least 76yrs ago  . Amputation Left 05/20/2013    Procedure: AMPUTATION DIGIT;  Surgeon: Newt Minion, MD;  Location: [facility];  Service: Orthopedics;  Laterality: Left;  Left Great Toe Amputation  at [facility]   HPI:  Other Pertinent Information: Pt is a 69 y.o. male with PMH of Diabetes  type 2 with Charcot foot supposedly diet-controlled, hypertension,  generalized anxiety disorder, history of atrial fibrillation not on  anticoagulation presumably secondary to falls. Patient presents to the  hospital with 3 weeks of worsening productive cough with purulent sputum.  Guaifenesin has been helpful, but has not resolved his cough. He also has  been having increased weakness with some mild dyspnea on exertion which is  improved with rest. Also noted concerns of very dry mouth and poor oral  hygiene. CXR 7/28 revealed diffuse opacification of RLL consistent with  PNA with associated pleural effusion. Bedside swallow eval ordered to aide  in ruling out aspiration.   No Data Recorded  Assessment / Plan / Recommendation CHL IP CLINICAL IMPRESSIONS 12/04/2014  Therapy  Diagnosis Mild oral phase dysphagia;Moderate pharyngeal phase  dysphagia;Mild cervical esophageal phase dysphagia  Clinical Impression Pt currently demonstrating a mild oral/ moderate  pharyngeal dysphagia that appears sensorimotor in nature. Delay in swallow  initiation accompanied by reduced airway protection (reduced base of  tongue retraction and minimal epiglottic inversion) resulted in  penetration during the swallow x1 of thin liquids. Reduced epiglottic  inversion/ base of tongue retraction also resulted in moderate-severe  amounts of vallecular residuals across consistencies which led to  penetration post-swallow x1 of thin liquids- pt had a weak throat clear  response and cleared remaining penetrates with cued cough. Pt is not able  to control sip size and large sip  of nectar thick liquids resulted in  significant residuals in vallecula, about half of which were cleared with  multiple swallows and cough/ throat clears (cued- pt did not sense).  Nectar thick liquids by teaspoon accompanied by chin tuck reduced  residuals and controlled sip size. Minimal backflow to cervical esophagus  observed across consistencies. Recommend initiating dysphagia 2 diet,  nectar thick liquids by teaspoon, meds crushed in puree, full supervision  to cue pt to tuck chin, swallow 2x with chin down. Also have pt sit  upright 30 minutes after meal. Pt was able to follow instructions for  strategies during MBS with a visual cue- hopeful that this will continue  at bedside as well but decreased cognitive status continues to put pt at  increased risk of aspiration. SLP will continue to follow closely for diet  tolerance/ consider advancement or repeat MBS as cognitive status  improves.      CHL IP TREATMENT RECOMMENDATION 12/04/2014  Treatment Recommendations Therapy as outlined in treatment plan below     CHL IP DIET RECOMMENDATION 12/04/2014  SLP Diet Recommendations Dysphagia 2 (Fine chop);Nectar  Liquid Administration via (None)   Medication Administration Crushed with puree  Compensations Slow rate;Small sips/bites;Multiple dry swallows after each  bite/sip;Clear throat intermittently;Chin tuck  Postural Changes and/or Swallow Maneuvers (None)     CHL IP OTHER RECOMMENDATIONS 12/04/2014  Recommended Consults (None)  Oral Care Recommendations Oral care BID  Other Recommendations Order thickener from pharmacy;Clarify dietary  restrictions     No flowsheet data found.   CHL IP FREQUENCY AND DURATION 12/04/2014  Speech Therapy Frequency (ACUTE ONLY) min 2x/week  Treatment Duration 2 weeks     Pertinent Vitals/Pain none    SLP Swallow Goals No flowsheet data found.  No flowsheet data found.    CHL IP REASON FOR REFERRAL 12/04/2014  Reason for Referral Objectively evaluate swallowing function     CHL IP ORAL PHASE 12/04/2014  Lips (None)  Tongue (None)  Mucous membranes (None)  Nutritional status (None)  Other (None)  Oxygen therapy (None)  Oral Phase Impaired  Oral - Pudding Teaspoon (None)  Oral - Pudding Cup (None)  Oral - Honey Teaspoon (None)  Oral - Honey Cup (None)  Oral - Honey Syringe (None)  Oral - Nectar Teaspoon (None)  Oral - Nectar Cup (None)  Oral - Nectar Straw (None)  Oral - Nectar Syringe (None)  Oral - Ice Chips (None)  Oral - Thin Teaspoon (None)  Oral - Thin Cup (None)  Oral - Thin Straw (None)  Oral - Thin Syringe (None)  Oral - Puree (None)  Oral - Mechanical Soft (None)  Oral - Regular (None)  Oral - Multi-consistency (None)  Oral - Pill (None)  Oral Phase - Comment (None)      CHL IP PHARYNGEAL PHASE 12/04/2014  Pharyngeal Phase Impaired  Pharyngeal - Pudding Teaspoon (None)  Penetration/Aspiration details (pudding teaspoon) (None)  Pharyngeal - Pudding Cup (None)  Penetration/Aspiration details (pudding cup) (None)  Pharyngeal - Honey Teaspoon (None)  Penetration/Aspiration details (honey teaspoon) (None)  Pharyngeal - Honey Cup (None)  Penetration/Aspiration details (honey cup) (None)  Pharyngeal - Honey Syringe (None)   Penetration/Aspiration details (honey syringe) (None)  Pharyngeal - Nectar Teaspoon (None)  Penetration/Aspiration details (nectar teaspoon) (None)  Pharyngeal - Nectar Cup (None)  Penetration/Aspiration details (nectar cup) (None)  Pharyngeal - Nectar Straw (None)  Penetration/Aspiration details (nectar straw) (None)  Pharyngeal - Nectar Syringe (None)  Penetration/Aspiration details (nectar syringe) (None)  Pharyngeal - Ice Chips (  None)  Penetration/Aspiration details (ice chips) (None)  Pharyngeal - Thin Teaspoon (None)  Penetration/Aspiration details (thin teaspoon) (None)  Pharyngeal - Thin Cup (None)  Penetration/Aspiration details (thin cup) (None)  Pharyngeal - Thin Straw (None)  Penetration/Aspiration details (thin straw) (None)  Pharyngeal - Thin Syringe (None)  Penetration/Aspiration details (thin syringe') (None)  Pharyngeal - Puree (None)  Penetration/Aspiration details (puree) (None)  Pharyngeal - Mechanical Soft (None)  Penetration/Aspiration details (mechanical soft) (None)  Pharyngeal - Regular (None)  Penetration/Aspiration details (regular) (None)  Pharyngeal - Multi-consistency (None)  Penetration/Aspiration details (multi-consistency) (None)  Pharyngeal - Pill (None)  Penetration/Aspiration details (pill) (None)  Pharyngeal Comment (None)      CHL IP CERVICAL ESOPHAGEAL PHASE 12/04/2014  Cervical Esophageal Phase Impaired  Pudding Teaspoon (None)  Pudding Cup (None)  Honey Teaspoon (None)  Honey Cup (None)  Honey Straw (None)  Nectar Teaspoon Reduced cricopharyngeal relaxation;Esophageal backflow  into cervical esophagus  Nectar Cup Reduced cricopharyngeal relaxation;Esophageal backflow into  cervical esophagus  Nectar Straw (None)  Nectar Sippy Cup (None)  Thin Teaspoon (None)  Thin Cup Reduced cricopharyngeal relaxation;Esophageal backflow into  cervical esophagus  Thin Straw (None)  Thin Sippy Cup (None)  Cervical Esophageal Comment (None)    No flowsheet data found.         Kern Reap,  MA, CCC-SLP 12/04/2014, 1:55 PM [id number]    Thurnell Lose, MD  [facility] Pager:[phone]  If 7PM-7AM, please contact night-coverage [email or web address] Password [email or web address] 12/15/2014, 10:52 AM   LOS: 13 days

## 2014-12-15 NOTE — Progress Notes (Addendum)
Nutrition Follow-up  DOCUMENTATION CODES:   Not applicable  INTERVENTION:   -RD will make further recommendations, based on goals of care -If pt desires PEG, recommend contine previous TF regimen:  Re-start Vital AF 1.2 formula at 20 ml/hr and increase by 10 ml every 4 hours to goal rate of 70 ml/hr  TF regimen to provide 2016 kcals, 126 gm protein, 1362 ml of free water  NUTRITION DIAGNOSIS:   Inadequate oral intake related to inability to eat as evidenced by NPO status.  Ongoing  GOAL:   Patient will meet greater than or equal to 90% of their needs  Unmet  MONITOR:   Diet advancement, Labs, Weight trends, Skin, I & O's, Other (Comment) (Goals of care)  REASON FOR ASSESSMENT:   Consult Enteral/tube feeding initiation and management  ASSESSMENT:   69 y.o. male Diabetes type 2 with Charcot foot supposedly diet-controlled, hypertension, generalized anxiety disorder, history of atrial fibrillation not on anticoagulation presumably secondary to falls. Patient presents to the hospital with 3 weeks of worsening productive cough with purulent sputum. Guaifenesin has been helpful, but has not resolved his cough. He also has been having increased weakness with some mild dyspnea on exertion which is improved with rest.   MD removed NGT on 12/14/14, due to aspiration. Per MD notes, pt has shown some clinical improvement since NGT has been d/c'd.   SLP re-evaluated today; continuing to recommend NPO via alternative means, as pt had wet vocal quality with PO trials.   Palliative care consulted for goals of care. Pt will either require PEG or transition to comfort care.   Diet Order:  Diet NPO time specified  Skin:  Wound (see comment) (MASD on buttocks)  Last BM:  12/15/14  Height:   Ht Readings from Last 1 Encounters:  12/02/14 5\' 11"  (1.803 m)    Weight:   Wt Readings from Last 1 Encounters:  12/15/14 187 lb 6.3 oz (85 kg)    Ideal Body Weight:  78.2 kg  BMI:   Body mass index is 26.15 kg/(m^2).  Estimated Nutritional Needs:   Kcal:  2000-2200  Protein:  125-135 gm  Fluid:  per MD  EDUCATION NEEDS:   No education needs identified at this time  Labrittany Wechter A. Jimmye Norman, RD, LDN, CDE Pager: [phone] After hours Pager: [phone]

## 2014-12-15 NOTE — Progress Notes (Addendum)
Speech Language Pathology Treatment: Dysphagia  Patient Details Name: Logan May MRN: [id number] DOB: 10-Apr-1946 Today's Date: 12/15/2014 Time: 7619-5093 SLP Time Calculation (min) (ACUTE ONLY): 27 min  Assessment / Plan / Recommendation Clinical Impression  Dysphagia treatment provided today for PO trials. Majority of session was spent providing thorough oral care, encouraging pt to cough up secretions as vocal quality was gurgly. Pt continues to have a weak cough and took multiple attempts to cough up phlegm; spent ~10 minutes on just that. Attempted trials of puree. Pt able to follow directions better today and used chin tuck as advised on MBS 7/30. However, pt with immediate wet vocal quality following trials and coughed up puree consistency. Unfortunately still cannot recommend safe diet. Recommend pt continue to be NPO with frequent oral care. RN reported that pt to have palliative consult which will help guide decisions re: continued SLP treatment. Will continue to follow.   HPI Other Pertinent Information: 69 year old male admitted 7/28 with complaints of productive cough and SOB x 3 weeks. Admitted 7/28 for R sided CAP with associated effusion on CXR. PCCM consulted for evaluation of effusion. 7/31 rt vats for empyema with VDRF and hypotesion. History of ETOH, pt has been confused and agitated. MBS on 7/30 recommended Dys 2 diet with nectar via teaspoon with a chin tuck. After this assessment pt was intubated for procedure from 7/31 to 8/3.    Pertinent Vitals Pain Assessment: No/denies pain  SLP Plan  Continue with current plan of care    Recommendations Diet recommendations: NPO Medication Administration: Via alternative means              Oral Care Recommendations: Oral care QID Plan: Continue with current plan of care    GO     Kern Reap, MA, CCC-SLP 12/15/2014, 9:45 AM [phone]

## 2014-12-15 NOTE — Clinical Social Work Note (Signed)
CSW unable to meet with patient's wife at bedside. Discussion was had with the patient's wife by phone to update on available bed offers. [name] will review the options and notify CSW of her decision ASAP.   Liz Beach MSW, [place], [place], [phone]

## 2014-12-15 NOTE — Progress Notes (Signed)
10 Days Post-Op Procedure(s) (LRB): VIDEO ASSISTED THORACOSCOPY (VATS)/EMPYEMA (N/A) VIDEO BRONCHOSCOPY (N/A) Subjective: More alert follows conversation with some inappropriate comments "I'm congested"  Objective: Vital signs in last 24 hours: Temp:  [98.3 F (36.8 C)-99.8 F (37.7 C)] 99.2 F (37.3 C) (08/10 1400) Pulse Rate:  [94-113] 113 (08/10 1400) Cardiac Rhythm:  [-] Atrial fibrillation (08/10 0823) Resp:  [18-24] 22 (08/10 1400) BP: (127-177)/(64-83) 177/64 mmHg (08/10 1400) SpO2:  [94 %-100 %] 99 % (08/10 1400) Weight:  [187 lb 6.3 oz (85 kg)] 187 lb 6.3 oz (85 kg) (08/10 0653)  Hemodynamic parameters for last 24 hours:    Intake/Output from previous day: 08/09 0701 - 08/10 0700 In: 724.2 [I.V.:274.2; IV Piggyback:450] Out: 2075 [Urine:2075] Intake/Output this shift: Total I/O In: 120 [Other:120] Out: 1250 [Urine:1250]  General appearance: cooperative and no distress Heart: regular rate and rhythm Lungs: coarse BS bilaterally decreased in bases  Lab Results:  Recent Labs  12/13/14 0458 12/14/14 0520  WBC 11.2* 10.9*  HGB 8.1* 8.0*  HCT 25.8* 25.5*  PLT 428* 405*   BMET:  Recent Labs  12/14/14 0520 12/15/14 0612  NA 141 142  K 3.3* 3.4*  CL 106 105  CO2 27 29  GLUCOSE 206* 106*  BUN 10 7  CREATININE 1.02 0.94  CALCIUM 7.5* 7.4*    PT/INR: No results for input(s): LABPROT, INR in the last 72 hours. ABG    Component Value Date/Time   PHART 7.434 12/06/2014 0610   HCO3 23.4 12/06/2014 0610   TCO2 24.5 12/06/2014 0610   ACIDBASEDEF 0.3 12/06/2014 0610   O2SAT 94.5 12/06/2014 0610   CBG (last 3)   Recent Labs  12/15/14 0445 12/15/14 0749 12/15/14 1152  GLUCAP 108* 119* 135*    Assessment/Plan: S/P Procedure(s) (LRB): VIDEO ASSISTED THORACOSCOPY (VATS)/EMPYEMA (N/A) VIDEO BRONCHOSCOPY (N/A) -  Failed swallow reassessment Needs PEG and speech therapy I talked with his wife about the need for PEG. She had some misconceptions,  hopefully they are cleared up    LOS: 13 days    Melrose Nakayama 12/15/2014

## 2014-12-16 ENCOUNTER — Inpatient Hospital Stay (HOSPITAL_COMMUNITY): Payer: Medicare Other

## 2014-12-16 ENCOUNTER — Encounter (HOSPITAL_COMMUNITY): Payer: Self-pay | Admitting: General Surgery

## 2014-12-16 LAB — CBC
HCT: 24.4 % — ABNORMAL LOW (ref 39.0–52.0)
HEMOGLOBIN: 7.6 g/dL — AB (ref 13.0–17.0)
MCH: 28.6 pg (ref 26.0–34.0)
MCHC: 31.1 g/dL (ref 30.0–36.0)
MCV: 91.7 fL (ref 78.0–100.0)
Platelets: 378 10*3/uL (ref 150–400)
RBC: 2.66 MIL/uL — ABNORMAL LOW (ref 4.22–5.81)
RDW: 15.7 % — AB (ref 11.5–15.5)
WBC: 9.3 10*3/uL (ref 4.0–10.5)

## 2014-12-16 LAB — GLUCOSE, CAPILLARY
GLUCOSE-CAPILLARY: 110 mg/dL — AB (ref 65–99)
GLUCOSE-CAPILLARY: 123 mg/dL — AB (ref 65–99)
GLUCOSE-CAPILLARY: 151 mg/dL — AB (ref 65–99)
Glucose-Capillary: 132 mg/dL — ABNORMAL HIGH (ref 65–99)
Glucose-Capillary: 135 mg/dL — ABNORMAL HIGH (ref 65–99)
Glucose-Capillary: 115 mg/dL — ABNORMAL HIGH (ref 65–99)

## 2014-12-16 LAB — MAGNESIUM: Magnesium: 1.7 mg/dL (ref 1.7–2.4)

## 2014-12-16 LAB — BASIC METABOLIC PANEL
ANION GAP: 10 (ref 5–15)
BUN: 6 mg/dL (ref 6–20)
CO2: 30 mmol/L (ref 22–32)
Calcium: 7.4 mg/dL — ABNORMAL LOW (ref 8.9–10.3)
Chloride: 101 mmol/L (ref 101–111)
Creatinine, Ser: 0.99 mg/dL (ref 0.61–1.24)
GFR calc non Af Amer: >60 mL/min (ref >60)
GLUCOSE: 121 mg/dL — AB (ref 65–99)
Potassium: 3.3 mmol/L — ABNORMAL LOW (ref 3.5–5.1)
Sodium: 141 mmol/L (ref 135–145)

## 2014-12-16 MED ORDER — ENOXAPARIN SODIUM 40 MG/0.4ML ~~LOC~~ SOLN
40.0000 mg | SUBCUTANEOUS | Status: DC
  Administered 2014-12-18: 40 mg via SUBCUTANEOUS
  Filled 2014-12-16: qty 0.4

## 2014-12-16 MED ORDER — ASPIRIN 300 MG RE SUPP
150.0000 mg | Freq: Every day | RECTAL | Status: DC
  Administered 2014-12-16 – 2014-12-17 (×2): 150 mg via RECTAL
  Filled 2014-12-16 (×2): qty 1

## 2014-12-16 MED ORDER — DEXTROSE-NACL 5-0.45 % IV SOLN
INTRAVENOUS | Status: DC
  Administered 2014-12-16: 23:00:00 via INTRAVENOUS
  Administered 2014-12-16 – 2014-12-17 (×2): 1000 mL via INTRAVENOUS
  Administered 2014-12-18: 06:00:00 via INTRAVENOUS

## 2014-12-16 MED ORDER — POTASSIUM CHLORIDE 10 MEQ/100ML IV SOLN
10.0000 meq | INTRAVENOUS | Status: AC
  Administered 2014-12-16 (×4): 10 meq via INTRAVENOUS
  Filled 2014-12-16 (×3): qty 100

## 2014-12-16 MED ORDER — SODIUM CHLORIDE 0.9 % IJ SOLN
10.0000 mL | INTRAMUSCULAR | Status: DC | PRN
  Administered 2014-12-16 – 2014-12-19 (×6): 10 mL
  Filled 2014-12-16 (×5): qty 40

## 2014-12-16 MED ORDER — ACETAMINOPHEN 650 MG RE SUPP: 325.0000 mg | Freq: Four times a day (QID) | RECTAL | Status: DC | PRN

## 2014-12-16 MED ORDER — MAGNESIUM SULFATE IN D5W 10-5 MG/ML-% IV SOLN
1.0000 g | Freq: Once | INTRAVENOUS | Status: AC
  Administered 2014-12-16: 1 g via INTRAVENOUS
  Filled 2014-12-16: qty 100

## 2014-12-16 NOTE — Progress Notes (Addendum)
Speech Language Pathology Treatment: Dysphagia  Patient Details Name: Logan May MRN: [id number] DOB: 1946-05-07 Today's Date: 12/16/2014 Time: 0920-1005 SLP Time Calculation (min) (ACUTE ONLY): 45 min  Assessment / Plan / Recommendation Clinical Impression  Pt presents with ongoing severe dysphagia  - with ongoing concerns for aspiration of secretions and all po offered.  Pt fortunately able to cough/expectorate secretions especially after SLP providing tsp of thin water to loosen secretions.   Pt continues with wet vocal quality that will clear with cued cough/throat clear approximately 80% of opportunities.   SLP administered tsp of nectar thick liquids with chin tuck posture = multiple swallows and 3 noted with post=swallow cough concerning for inadequate airway protection.    Educated pt, wife and wife's cousin to recommendations of allowing small single ice chips only to facilitate oral care and swallowing rehabilitation.  Reviewed use of oral suction with wife and pt and provided pt with emesis basin to use prn.  Using teach back, educated pt to need to cough/expectorate, throat clear and dry swallow prn.  Provided written information to pt/family and pt benefited from mod cues.   Per spouse and pt, plan for PEG.  Acute SLP follow up not indicated at this time as plan of care in place.  All education completed.  Thanks for allowing me to help with this pt's care plan.     HPI Other Pertinent Information: 69 year old male admitted 7/28 with complaints of productive cough and SOB x 3 weeks. Admitted 7/28 for R sided CAP with associated effusion on CXR. PCCM consulted for evaluation of effusion. 7/31 rt vats for empyema with VDRF and hypotesion. History of ETOH, pt has been confused and agitated. MBS on 7/30 recommended Dys 2 diet with nectar via teaspoon with a chin tuck. After this assessment pt was intubated for procedure from 7/31 to 8/3.    Pertinent Vitals Pain Assessment: Faces  ("pain all over" = pt states this is rhetorical)  SLP Plan  Continue with current plan of care    Recommendations Diet recommendations: NPO (ice chips) Liquids provided via: Teaspoon Medication Administration: Via alternative means Supervision: Full supervision/cueing for compensatory strategies Compensations: Slow rate;Small sips/bites;Multiple dry swallows after each bite/sip;Clear throat intermittently;Chin tuck (with ice only)              Oral Care Recommendations: Oral care QID Follow up Recommendations: Skilled Nursing facility Plan: Continue with current plan of care    [place], [place] [facility] SLP [phone]

## 2014-12-16 NOTE — Progress Notes (Addendum)
CT reviewed with Dr. Barbie Banner and colon is anterior to the stomach and liver, not amendable to percutaneous approach, would consider surgical consult.   Tsosie Billing PA-C Interventional Radiology  12/16/14  2:42 PM

## 2014-12-16 NOTE — Progress Notes (Signed)
IR aware of request for percutaneous gastrostomy tube, will await palliative care consult prior to moving forward with CT to evaluate anatomy.   Tsosie Billing PA-C Interventional Radiology  12/16/14  10:23 AM

## 2014-12-16 NOTE — Progress Notes (Signed)
PATIENT DETAILS Name: Logan May Age: 69 y.o. 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 PCCM was consulted, underwent thoracocentesis on 7/30-pleural fluid was consistent with an empyema. Subsequently cardiothoracic surgery was consulted, and a right sided VATS, drainage of empyema and visceral/pleural decortication was done on 7/31. Patient subsequently developed respiratory failure and required mechanical ventilation till 8/3. ICU course was complicated by development of atrial fibrillation with RVR requiring amiodarone infusion and severe delirium thought to be secondary to alcohol withdrawal. Upon clinical stability, patient was transferred to the hospitalist service on 8/6.  Subjective:  Is confused but denies any headache, no chest or abdominal pain. Is following basic commands. Denies any focal weakness.  Assessment/Plan:  Severe sepsis from right-sided pneumonia and empyema likely due to underlying micro-aspiration: With acute hypoxic respiratory failure requiring intubation.  Sepsis pathophysiology has resolved following VATS/decortication. He required brief intubation and ICU stay was extubated on 12/07/2014. Blood culture neg, but tissue culture positive for microaerophilic streptococcus will taper down to Rocephin at this point.  He continues to exhibit signs of aspiration, despite NG tube feeding patient is clinically aspirating and I suspect he is even aspirating his oral secretions, therapy on board, I discontinued NG  tube on 12/14/2014, Had added anti-suction and pulmonary toiletry, he shows some clinical improvement since NG tube was discontinued and pulm toiletry was initiated.  He has failed 3 evaluations by speech therapist from 8/9 to 12/16/2014, after detailed discussions with wife, pulmonary physician Dr. Marianna Payment at [facility], speech therapist and input by Dr. Roxan Hockey cardiothoracic surgery. Wife has now agreed for a PEG tube placement. INR has been requested to evaluate and place a GJ tube.  The new gentle hydration. Wife has been explained that patient remains at risk for aspirating his oral secretions which is suspect he does. She excepts the risks but still wants to proceed with PEG tube. Continue supportive care for respiratory failure with oxygen nebulizer treatments and pulmonary toiletry.   Addendum wife requested patient be transferred to [facility], discussed the case with [facility] critical care physician Dr. Marianna Payment. On 12/15/2014 1:50 PM. Who agrees that besides feeding tube versus comfort measures he has nothing to offer and agrees with management at this facility.    Right sided Empyema I suspect from long-standing microaspiration: underwent VATS, drainage of empyema and visceral/pleural decortication on 7/31.Tissue culture 7/31 positive for Microaerophilic streptococci,  AFB culture/fungal culture negative so far. ABX adjusted based on cultures - Abx stop date 8/13   Severe Dysphagia: Suspect long-term microaspiration, speech on board, despite having an NG tube patient continued to have clinical evidence of ongoing aspiration of oral secretions and GI content regurgitation. NG tube was removed on 12/14/2014. Shows improvement on 12/15/2014. Has failed 3 evaluations by speech therapy, now due for PEG tube placement.   Acute Urinary Retention:underwent Flexible cytoscopy and passage of foley catheter by Dr Gaynelle Arabian on 7/31   Afib VWU:[id number] Amiodarone gtt for rate control, now on prn lopressor.  Felt to be a poor candidate for long term anticoagulation-although  has a CHADS2Vasc SCORE of  3. Stared on ASA   ETOH withdrawal with delirium and metabolic encephalopathy:required Precedex gtt while in [place] less agitated-still somewhat confused-continue Ativan per CIWA protocol. Counseled to quit alcohol.   Enterococcal UTI: Seen on urine culture 7/29-this is pansensitive. Has been treated with Zosyn.   Left upper Ext swelling: stable Doppler   Anemia: likely secondary to  critical illness. Follow CBC and transfuse prn if HB<7   Hx of COPD with emphysema:continue bronchodilators-lungs clear   Dyslipidemia:continue Statin   Severe Deconditioning:PT on board-SNF on discharge   Type 2 DM: CBG's stable with SSI. A1C 6.8 on 7/29  CBG (last 3)   Recent Labs  12/16/14 0006 12/16/14 0352 12/16/14 0741  GLUCAP 132* 110* 135*     Hypokalemia and hypomagnesemia. Both replaced.     Disposition: Remain inpatient-SNF on discharge   Antimicrobial agents  See below  Anti-infectives    Start     Dose/Rate Route Frequency Ordered Stop   12/12/14 1600  cefTRIAXone (ROCEPHIN) 1 g in dextrose 5 % 50 mL IVPB     1 g 100 mL/hr over 30 Minutes Intravenous Every 24 hours 12/12/14 1451     12/12/14 1200  levofloxacin (LEVAQUIN) IVPB 750 mg  Status:  Discontinued     750 mg 100 mL/hr over 90 Minutes Intravenous Every 24 hours 12/12/14 1042 12/12/14 1451   12/10/14 2100  piperacillin-tazobactam (ZOSYN) IVPB 3.375 g  Status:  Discontinued     3.375 g 12.5 mL/hr over 240 Minutes Intravenous Every 8 hours 12/10/14 1432 12/12/14 1035   12/05/14 0900  vancomycin (VANCOCIN) IVPB 1000 mg/200 mL premix  Status:  Discontinued     1,000 mg 200 mL/hr over 60 Minutes Intravenous Every 12 hours 12/04/14 2003 12/09/14 0736   12/05/14 0600  vancomycin (VANCOCIN) IVPB 1000 mg/200 mL premix  Status:  Discontinued     1,000 mg 200 mL/hr over 60 Minutes Intravenous To Surgery 12/04/14  1604 12/04/14 2000   12/04/14 2100  piperacillin-tazobactam (ZOSYN) IVPB 3.375 g  Status:  Discontinued     3.375 g 12.5 mL/hr over 240 Minutes Intravenous Every 8 hours 12/04/14 2003 12/10/14 1432   12/04/14 2100  vancomycin (VANCOCIN) 1,500 mg in sodium chloride 0.9 % 500 mL IVPB     1,500 mg 250 mL/hr over 120 Minutes Intravenous  Once 12/04/14 2003 12/04/14 2348   12/03/14 1330  levofloxacin (LEVAQUIN) IVPB 750 mg  Status:  Discontinued     750 mg 100 mL/hr over 90 Minutes Intravenous Every 24 hours 12/02/14 1514 12/04/14 1951   12/02/14 1130  cefTRIAXone (ROCEPHIN) 1 g in dextrose 5 % 50 mL IVPB     1 g 100 mL/hr over 30 Minutes Intravenous  Once 12/02/14 1118 12/02/14 1246   12/02/14 1130  azithromycin (ZITHROMAX) 500 mg in dextrose 5 % 250 mL IVPB     500 mg 250 mL/hr over 60 Minutes Intravenous  Once 12/02/14 1118 12/02/14 1429      DVT Prophylaxis: Prophylactic Lovenox   Code Status: Full code   Family Communication Wife bedside 12-12-14  Procedures: 7/30>>Thoracocentesis 7/31>>VATS, drainage of empyema and visceral/pleural decortication(Dr Hendrickson) 7/31>> Flexible cytoscopy and passage of foley catheter (Dr Gaynelle Arabian) 7/31>>8/3 ETT, R.IJ C line 12/12/2014 bilateral upper extremity venous duplex - no DVT SVT  CONSULTS:   pulmonary/intensive care, urology and CTVS, Pall Care  Time spent  40 minutes-Greater than 50% of this time was spent in counseling, explanation of diagnosis, planning of further  management, and coordination of care.  MEDICATIONS: Scheduled Meds: . aspirin  150 mg Rectal Daily  . atorvastatin  20 mg Oral q1800  . cefTRIAXone (ROCEPHIN)  IV  1 g Intravenous Q24H  . cholecalciferol  2,000 Units Oral Once per day on Mon Tue Wed Thu Fri  . cholecalciferol  4,000 Units Oral Once per day on Sun Sat  . enoxaparin (LOVENOX) injection  40 mg Subcutaneous Q24H  . folic acid  1 mg Intravenous Daily  . insulin aspart  0-15 Units Subcutaneous 6  times per day  . ipratropium-albuterol  3 mL Nebulization TID  . magnesium sulfate 1 - 4 g bolus IVPB  1 g Intravenous Once  . metoprolol tartrate  25 mg Oral BID  . potassium chloride  10 mEq Intravenous Q1 Hr x 4  . scopolamine  1 patch Transdermal Q72H  . senna-docusate  1 tablet Oral QHS  . thiamine IV  100 mg Intravenous Daily   Continuous Infusions: . dextrose 5 % and 0.45% NaCl     PRN Meds:.acetaminophen, albuterol, fentaNYL (SUBLIMAZE) injection, Gerhardt's butt cream, ipratropium-albuterol, LORazepam, metoprolol, ondansetron (ZOFRAN) IV    PHYSICAL EXAM: Vital signs in last 24 hours: Filed Vitals:   12/15/14 2200 12/16/14 0604 12/16/14 0700 12/16/14 1030  BP: 122/72 140/82    Pulse: 111 111    Temp:  98.5 F (36.9 C)  97.9 F (36.6 C)  TempSrc:  Oral    Resp: 20 20    Height:      Weight:   82.4 kg (181 lb 10.5 oz)   SpO2: 100% 100%      Weight change: -2.6 kg (-5 lb 11.7 oz) Filed Weights   12/14/14 0500 12/15/14 0653 12/16/14 0700  Weight: 82.8 kg (182 lb 8.7 oz) 85 kg (187 lb 6.3 oz) 82.4 kg (181 lb 10.5 oz)   Body mass index is 25.35 kg/(m^2).   Gen Exam: Awake but confused-however follows some commands. Panda tube in place Neck: Supple, No JVD.  Chest: B/L Clear anteriorly CVS: S1 S2 Regular, no murmurs.  Abdomen: soft, BS +, non tender Extremities: no edema, lower extremities warm to touch. Neurologic: Non Focal-but with gen weakness  Intake/Output from previous day:  Intake/Output Summary (Last 24 hours) at 12/16/14 1112 Last data filed at 12/16/14 0700  Gross per 24 hour  Intake    649 ml  Output   2500 ml  Net  -1851 ml     LAB RESULTS: CBC  Recent Labs Lab 12/10/14 0416 12/11/14 0515 12/13/14 0458 12/14/14 0520 12/16/14 0420  WBC 7.9 9.9 11.2* 10.9* 9.3  HGB 8.0* 8.4* 8.1* 8.0* 7.6*  HCT 25.2* 26.5* 25.8* 25.5* 24.4*  PLT 353 429* 428* 405* 378  MCV 89.4 89.2 89.9 89.8 91.7  MCH 28.4 28.3 28.2 28.2 28.6  MCHC 31.7 31.7 31.4  31.4 31.1  RDW 15.7* 15.7* 15.9* 16.0* 15.7*    Chemistries   Recent Labs Lab 12/10/14 2225 12/11/14 0515 12/13/14 0458 12/14/14 0520 12/15/14 0612 12/16/14 0420  NA  --  143 141 141 142 141  K  --  3.7 3.3* 3.3* 3.4* 3.3*  CL  --  115* 109 106 105 101  CO2  --  23 26 27 29 30   GLUCOSE  --  167* 203* 206* 106* 121*  BUN  --  8 8 10 7 6   CREATININE  --  0.98 0.93 1.02 0.94 0.99  CALCIUM  --  7.0* 7.3* 7.5* 7.4* 7.4*  MG  2.4  --  1.4* 1.6* 1.7 1.7    CBG:  Recent Labs Lab 12/15/14 1605 12/15/14 2001 12/16/14 0006 12/16/14 0352 12/16/14 0741  GLUCAP 112* 120* 132* 110* 135*    GFR Estimated Creatinine Clearance: 73.9 mL/min (by C-G formula based on Cr of 0.99).  Coagulation profile No results for input(s): INR, PROTIME in the last 168 hours.  Cardiac Enzymes No results for input(s): CKMB, TROPONINI, MYOGLOBIN in the last 168 hours.  Invalid input(s): CK  Invalid input(s): POCBNP No results for input(s): DDIMER in the last 72 hours. No results for input(s): HGBA1C in the last 72 hours. No results for input(s): CHOL, HDL, LDLCALC, TRIG, CHOLHDL, LDLDIRECT in the last 72 hours. No results for input(s): TSH, T4TOTAL, T3FREE, THYROIDAB in the last 72 hours.  Invalid input(s): FREET3 No results for input(s): VITAMINB12, FOLATE, FERRITIN, TIBC, IRON, RETICCTPCT in the last 72 hours. No results for input(s): LIPASE, AMYLASE in the last 72 hours.  Urine Studies No results for input(s): UHGB, CRYS in the last 72 hours.  Invalid input(s): UACOL, UAPR, USPG, UPH, UTP, UGL, UKET, UBIL, UNIT, UROB, ULEU, UEPI, UWBC, URBC, UBAC, CAST, UCOM, BILUA  MICROBIOLOGY: No results found for this or any previous visit (from the past 240 hour(s)).  RADIOLOGY STUDIES/RESULTS: Dg Chest 1 View  12/04/2014   CLINICAL DATA:  Post right thoracentesis  EXAM: CHEST  1 VIEW  COMPARISON:  12/02/2014  FINDINGS: Diffuse right lung airspace disease again noted, slightly improved. Decreasing  right effusion following thoracentesis. No pneumothorax. Heart is borderline in size. Nodular density projecting over the left lung base is felt represent nipple shadow. Left lung is clear. No acute bony abnormality.  IMPRESSION: Decreasing right effusion and diffuse right lung airspace disease. No pneumothorax.   Electronically Signed   By: Rolm Baptise M.D.   On: 12/04/2014 13:10   Dg Chest 2 View  12/02/2014   CLINICAL DATA:  Altered level of consciousness.  EXAM: CHEST  2 VIEW  COMPARISON:  Sep 04, 2012.  FINDINGS: The heart size and mediastinal contours are within normal limits. No pneumothorax is noted. Left lung is clear. There is now noted diffuse opacification of the right lower lobe most consistent with pneumonia with associated pleural effusion. The visualized skeletal structures are unremarkable.  IMPRESSION: Diffuse opacification of the right lower lobe most consistent with pneumonia with associated pleural effusion. Follow-up radiographs are recommended.   Electronically Signed   By: Marijo Conception, M.D.   On: 12/02/2014 10:57   Dg Abd 1 View  12/12/2014   CLINICAL DATA:  Feeding tube placement  EXAM: ABDOMEN - 1 VIEW  COMPARISON:  12/10/2014  FINDINGS: Tip of feeding tube projects over proximal stomach.  Air-filled upper normal caliber loops of small bowel.  Scattered colonic gas.  No evidence of bowel obstruction or dilatation.  Scattered degenerative disc disease changes lumbar spine.  Foley catheter in urinary bladder.  IMPRESSION: Tip of feeding tube projects over proximal stomach.   Electronically Signed   By: Lavonia Dana M.D.   On: 12/12/2014 10:05   Ct Head Wo Contrast  12/15/2014   CLINICAL DATA:  Delirium secondary to alcohol withdrawal  EXAM: CT HEAD WITHOUT CONTRAST  TECHNIQUE: Contiguous axial images were obtained from the base of the skull through the vertex without intravenous contrast.  COMPARISON:  None.  FINDINGS: There is no evidence of mass effect, midline shift, or  extra-axial fluid collections. There is no evidence of a space-occupying lesion or intracranial hemorrhage.  There is no evidence of a cortical-based area of acute infarction. There is generalized cerebral atrophy. There is periventricular white matter low attenuation likely secondary to microangiopathy.  The ventricles and sulci are appropriate for the patient's age. The basal cisterns are patent.  Visualized portions of the orbits are unremarkable. The visualized portions of the paranasal sinuses and mastoid air cells are unremarkable. Cerebrovascular atherosclerotic calcifications are noted.  The osseous structures are unremarkable.  IMPRESSION: No acute intracranial pathology.   Electronically Signed   By: Kathreen Devoid   On: 12/15/2014 16:05   Ct Chest Wo Contrast  12/04/2014   CLINICAL DATA:  Right hemithorax opacification, assess hemothorax, effusion and loculation. Review of electronic records demonstrates history of productive cough and shortness of breath for 3 weeks. Recent falls.  EXAM: CT CHEST WITHOUT CONTRAST  TECHNIQUE: Multidetector CT imaging of the chest was performed following the standard protocol without IV contrast.  COMPARISON:  Chest radiograph 12/02/2014  FINDINGS: Large volume of loculated right pleural fluid. Within the right lower hemithorax are multiple foci of air within the large volume pleural fluid raising concern for empyema. There is mass effect on the adjacent bronchi, with compressive atelectasis of the dependent right upper lobe and consolidation in the right middle lobe with air bronchograms. The origin of the right lower lobe bronchus is not well-defined, and no aerated right lower lobe lung is confidently identified. Pleural fluid is heterogeneous in density.  There is a small left pleural effusion that appears simple fluid in density. Adjacent compressive atelectasis in the lower lobe. The left upper lobe is clear.  Heart at the upper limits of normal in size. There are  dense coronary artery calcifications. No definite pericardial fluid. Prominent small superior mediastinal lymph nodes measuring 9 mm short axis dimension. Limited assessment for hilar adenopathy. Mild atherosclerosis of normal caliber thoracic aorta.  No definite acute abnormality in the included upper abdomen, perinephric stranding is partially included.  There are no acute rib fractures. Remote nondisplaced lower right rib fractures with well-formed callus. Age related degenerative change throughout spine. No blastic or destructive lytic osseous lesions.  IMPRESSION: 1. Large partially loculated heterogeneous right pleural effusion. In the lower portion are multiple foci of air. Findings are most concerning for empyema. There is consolidation or atelectasis of the entire right lower lobe, consolidation with air bronchograms in the right middle lobe, and compressive atelectasis of the adjacent right upper lobe. Hemothorax is felt less likely, given the right lower rib fractures appear remote. 2. Small left pleural effusion with adjacent compressive atelectasis. 3. Dense coronary artery calcifications.   Electronically Signed   By: Jeb Levering M.D.   On: 12/04/2014 02:42   Dg Chest Port 1 View  12/16/2014   CLINICAL DATA:  Pneumonia.  EXAM: PORTABLE CHEST - 1 VIEW  COMPARISON:  12/14/2014 .  FINDINGS: Interim removal of NG tube. Right IJ line stable position. Stable cardiomegaly. Persistent but partially clearing bilateral pulmonary infiltrates and bilateral pleural effusions. No pneumothorax .  IMPRESSION: 1. Interim removal of NG tube.  Right IJ line in stable position. 2. Persistent but partially clearing bilateral pulmonary infiltrates and bilateral pleural effusions .   Electronically Signed   By: Marcello Moores  Register   On: 12/16/2014 08:00   Dg Chest Port 1 View  12/14/2014   CLINICAL DATA:  Pneumonia.  EXAM: PORTABLE CHEST - 1 VIEW  COMPARISON:  12/11/2014  FINDINGS: Right jugular catheter tip in the  SVC unchanged. Feeding tube tip in the  proximal stomach.  Right lower lobe airspace disease unchanged with small right effusion  Left lower lobe consolidation unchanged, with left effusion  Cardiac enlargement with mild vascular congestion.  IMPRESSION: Right lower lobe infiltrate and effusion, worrisome for pneumonia  Left lower lobe consolidation and effusion possibly atelectasis or pneumonia  Pulmonary vascular congestion.   Electronically Signed   By: Franchot Gallo M.D.   On: 12/14/2014 08:00   Dg Chest Port 1 View  12/11/2014   CLINICAL DATA:  69 year old male with history of empyema.  EXAM: PORTABLE CHEST - 1 VIEW  COMPARISON:  Chest x-ray 12/10/2014.  FINDINGS: Previously noted right-sided chest tube has been removed. No appreciable right pneumothorax. Right IJ catheter with tip terminating in the distal superior vena cava. Feeding tube extending into the upper abdomen, with tip below the lower margin of the image. Lung volumes are slightly low. Bibasilar opacities may reflect areas of atelectasis and/or consolidation. Small to moderate bilateral pleural effusions. Cephalization of pulmonary vasculature. Mild cardiomegaly. Upper mediastinal contours are slightly distorted by patient positioning.  IMPRESSION: 1. Support apparatus, as above. 2. No definite right pneumothorax following right-sided chest tube removal. 3. Bibasilar opacities may reflect areas of atelectasis and/or consolidation, with superimposed small to moderate bilateral pleural effusions. 4. Cardiomegaly with pulmonary venous congestion, but no frank pulmonary edema.   Electronically Signed   By: Vinnie Langton M.D.   On: 12/11/2014 08:36   Dg Chest Port 1 View  12/10/2014   CLINICAL DATA:  69 year old male status post VATS for empyema. Initial encounter.  EXAM: PORTABLE CHEST - 1 VIEW  COMPARISON:  12/09/2014 and earlier.  FINDINGS: Portable AP semi upright view at 0710 hrs. Enteric feeding tube is been placed, tip not included. 1 of  the 2 right chest tubes has been removed. Single residual right side chest tube and right IJ central line appear stable. Stable lung volumes. Stable cardiac size and mediastinal contours. Residual peripheral and basilar opacity in the right lung is stable. No pneumothorax. Left lower lobe atelectasis.  IMPRESSION: 1. One right chest tube removed, 1 remains. No pneumothorax identified. 2. Feeding tube placed, tip not included. 3. Stable right lung ventilation. Left lung remarkable for lower lobe atelectasis.   Electronically Signed   By: Genevie Ann M.D.   On: 12/10/2014 07:49   Dg Chest Port 1 View  12/09/2014   CLINICAL DATA:  Post VATS, empyema, history prostate cancer, hypertension, diabetes mellitus  EXAM: PORTABLE CHEST - 1 VIEW  COMPARISON:  Portable exam 0647 hours compared 12/08/2014  FINDINGS: RIGHT jugular central venous catheter with tip projecting over SVC.  Pair of RIGHT thoracostomy tubes unchanged.  Mild enlargement of cardiac silhouette.  Stable mediastinal contours and pulmonary vascularity.  Mild residual pleural-based opacity in the RIGHT hemi thorax with persisting consolidation in RIGHT lower lobe.  LEFT lung clear.  No pneumothorax.  IMPRESSION: Stable RIGHT thoracostomy tubes with persistent RIGHT lower lobe consolidation and mild residual pleural-based opacity.   Electronically Signed   By: Lavonia Dana M.D.   On: 12/09/2014 07:55   Dg Chest Port 1 View  12/08/2014   CLINICAL DATA:  Empyema  EXAM: PORTABLE CHEST - 1 VIEW  COMPARISON:  12/07/2014  FINDINGS: Three chest tubes on the right are unchanged in position. No pneumothorax. Right lower lobe infiltrate. Minimal right effusion.  Left lower lobe atelectasis has progressed.  Negative for heart failure. Right jugular catheter tip in the SVC. Endotracheal tube and NG tubes removed.  IMPRESSION: Three chest  tubes remain on the right without pneumothorax. Right lower lobe consolidation remains.  Increase in left lower lobe atelectasis.   Endotracheal tube removed in the interval.   Electronically Signed   By: Franchot Gallo M.D.   On: 12/08/2014 08:03   Dg Chest Port 1 View  12/07/2014   CLINICAL DATA:  Empyema.  EXAM: PORTABLE CHEST - 1 VIEW  COMPARISON:  12/06/2014  FINDINGS: The endotracheal tube tip is just below the clavicular heads. The orogastric tube reaches the stomach at least. Right IJ central line, tip at the distal SVC.  3 right-sided thoracostomy tubes are in stable position. There is no definitive pneumothorax. Compared to preoperative study, pleural effusion remains decreased. Right basilar pneumonia is stable. Streaky left lower lobe opacity has slowly increased over the past 2 days, consistent with atelectasis. No pulmonary edema.  IMPRESSION: 1. Stable positioning of tubes and central line. 2. Recent right empyema drainage with no pneumothorax or increasing pleural fluid. 3. Stable right basilar pneumonia and left basilar atelectasis.   Electronically Signed   By: Monte Fantasia M.D.   On: 12/07/2014 07:41   Portable Chest Xray  12/06/2014   CLINICAL DATA:  Hypoxia  EXAM: PORTABLE CHEST - 1 VIEW  COMPARISON:  December 05, 2014  FINDINGS: Endotracheal tube tip is 5.3 cm above the carina. Central catheter tip is in the superior cava. Nasogastric tube tip and side port are below the diaphragm. Chest tubes are again noted on the right, unchanged in position. There is no appreciable pneumothorax. There is slightly less airspace consolidation in the right lower lung zone compared to 1 day prior. There is a mild degree of effusion on the right, stable. The left lung is essentially clear. Heart size and pulmonary vascularity are normal. No adenopathy.  IMPRESSION: Tube and catheter positions as described without pneumothorax. Partial clearing of consolidation right base. No new opacity. Small right effusion. Left lung clear. No change in cardiac silhouette.   Electronically Signed   By: Lowella Grip III M.D.   On: 12/06/2014 07:42    Dg Chest Port 1 View  12/05/2014   CLINICAL DATA:  Acute respiratory failure, hypoxia.  EXAM: PORTABLE CHEST - 1 VIEW  COMPARISON:  12/05/2014  FINDINGS: Right chest tubes and remainder support devices remain in place, unchanged. No pneumothorax. Interval placement of the NG tube into the stomach. Patchy right lung airspace disease slightly worsened since prior study. No confluent opacity on the left. Heart is normal size.  IMPRESSION: Worsening right lung airspace disease. Right chest tubes remain in place without pneumothorax.   Electronically Signed   By: Rolm Baptise M.D.   On: 12/05/2014 16:36   Dg Chest Port 1 View  12/05/2014   CLINICAL DATA:  Pneumothorax.  EXAM: PORTABLE CHEST - 1 VIEW  COMPARISON:  Radiograph 12/04/2014, CT 12/04/2014, chest radiograph 12/02/2014  FINDINGS: 3 RIGHT chest tubes noted. There is interval decrease in volume of the pleural fluid in the RIGHT hemi thorax. No appreciable pneumothorax identified. RIGHT central venous line is in place with tip in the distal SVC. Endotracheal tube is 3.8 cm from carina. LEFT lung is clear.  IMPRESSION: 1. Decrease in pleural fluid in the RIGHT hemi thorax. 2. Three right chest tubes in place without pneumothorax. 3. Endotracheal tube and central venous line appear in good position.   Electronically Signed   By: Suzy Bouchard M.D.   On: 12/05/2014 14:09   Dg Abd Portable 1v  12/11/2014   CLINICAL DATA:  Nasogastric tube placement.  Initial encounter.  EXAM: PORTABLE ABDOMEN - 1 VIEW  COMPARISON:  Abdominal radiograph performed 12/09/2014, and chest radiograph performed earlier today at 7:10 a.m.  FINDINGS: The patient's enteric tube is noted ending overlying the third segment of the duodenum, at appropriate position.  The visualized bowel gas pattern is grossly unremarkable, with scattered air filled loops of small and large bowel seen.  A small right pleural effusion is noted, with right basilar airspace opacification. No free  intra-abdominal air is identified, though evaluation for free air is limited on a single supine view.  Mild degenerative change is noted at the lower lumbar spine.  IMPRESSION: 1. Enteric tube noted ending overlying the third segment of the duodenum, at appropriate position. 2. Small right pleural effusion, with right basilar airspace opacification, mildly more prominent than on the prior study.   Electronically Signed   By: Garald Balding M.D.   On: 12/11/2014 02:01   Dg Abd Portable 1v  12/09/2014   CLINICAL DATA:  Feeding tube placement .  EXAM: PORTABLE ABDOMEN - 1 VIEW  COMPARISON:  None.  FINDINGS: Feeding tube noted with its tip projected over the descending portion of the duodenum. No gastric distention. Degenerative changes lumbar spine. Vascular calcification.  IMPRESSION: Feeding tube noted with tip projected over the descending portion of the duodenum. No gastric distention noted.   Electronically Signed   By: [place]   On: 12/09/2014 13:09   Dg Swallowing Func-speech Pathology  12/04/2014    Objective Swallowing Evaluation:    Patient Details  Name: Logan May MRN: [id number] Date of Birth: 23-Dec-1945  Today's Date: 12/04/2014 Time: SLP Start Time (ACUTE ONLY): 1235-SLP Stop Time (ACUTE ONLY): 1258 SLP Time Calculation (min) (ACUTE ONLY): 23 min  Past Medical History:  Past Medical History  Diagnosis Date  . BPH (benign prostatic hypertrophy)   . Prostate cancer     implants   . Vitamin D deficiency     takes Vit D daily  . History of ETT 1999    Dr, Caleen Essex  . Anxiety     takes Valium daily as needed  . Hyperlipidemia     takes Simvastatin nightly  . Hypertension     takes Quinapril and Procardia daily  . Headache(784.0)     occasionally  . Peripheral neuropathy   . Diabetes mellitus, type 2     but doesn't take any meds for it;diet controlled and exercise  . 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 Pneumonia 1960's?  . CAP (community acquired pneumonia) 12/02/2014   Past Surgical History:   Past Surgical History  Procedure Laterality Date  . Foot surgery  May 2002    Dr. Sharol Given   . Left foot casted 4-5 months    . Bilateral  eye laser surgery Bilateral 2000    "related to diabetes"  . Charcot foot left Left 12/15/01  . Laser eye surgery right  12/2002  . Eye surgery    . Tonsillectomy    . I&d extremity Left 09/05/2012    Procedure: IRRIGATION AND DEBRIDEMENT EXTREMITY;  Surgeon: Newt Minion, MD;  Location: [facility];  Service: Orthopedics;  Laterality: Left;   Excision Charcot Collapse Left Foot and Base 5th Metatarsal, Antibiotic  Beads  . Esophagogastroduodenoscopy      at least 11yrs ago  . 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 male with PMH of Diabetes  type 2 with Charcot foot supposedly diet-controlled, hypertension,  generalized anxiety disorder, history of atrial fibrillation not on  anticoagulation presumably secondary to falls. Patient presents to the  hospital with 3 weeks of worsening productive cough with purulent sputum.  Guaifenesin has been helpful, but has not resolved his cough. He also has  been having increased weakness with some mild dyspnea on exertion which is  improved with rest. Also noted concerns of very dry mouth and poor oral  hygiene. CXR 7/28 revealed diffuse opacification of RLL consistent with  PNA with associated pleural effusion. Bedside swallow eval ordered to aide  in ruling out aspiration.   No Data Recorded  Assessment / Plan / Recommendation CHL IP CLINICAL IMPRESSIONS 12/04/2014  Therapy Diagnosis Mild oral phase dysphagia;Moderate pharyngeal phase  dysphagia;Mild cervical esophageal phase dysphagia  Clinical Impression Pt currently demonstrating a mild oral/ moderate  pharyngeal dysphagia that appears sensorimotor in nature. Delay in swallow  initiation accompanied by  reduced airway protection (reduced base of  tongue retraction and minimal epiglottic inversion) resulted in  penetration during the swallow x1 of thin liquids. Reduced epiglottic  inversion/ base of tongue retraction also resulted in moderate-severe  amounts of vallecular residuals across consistencies which led to  penetration post-swallow x1 of thin liquids- pt had a weak throat clear  response and cleared remaining penetrates with cued cough. Pt is not able  to control sip size and large sip of nectar thick liquids resulted in  significant residuals in vallecula, about half of which were cleared with  multiple swallows and cough/ throat clears (cued- pt did not sense).  Nectar thick liquids by teaspoon accompanied by chin tuck reduced  residuals and controlled sip size. Minimal backflow to cervical esophagus  observed across consistencies. Recommend initiating dysphagia 2 diet,  nectar thick liquids by teaspoon, meds crushed in puree, full supervision  to cue pt to tuck chin, swallow 2x with chin down. Also have pt sit  upright 30 minutes after meal. Pt was able to follow instructions for  strategies during MBS with a visual cue- hopeful that this will continue  at bedside as well but decreased cognitive status continues to put pt at  increased risk of aspiration. SLP will continue to follow closely for diet  tolerance/ consider advancement or repeat MBS as cognitive status  improves.      CHL IP TREATMENT RECOMMENDATION 12/04/2014  Treatment Recommendations Therapy as outlined in treatment plan below     CHL IP DIET RECOMMENDATION 12/04/2014  SLP Diet Recommendations Dysphagia 2 (Fine chop);Nectar  Liquid Administration via (None)  Medication Administration Crushed with puree  Compensations Slow rate;Small sips/bites;Multiple dry swallows after each  bite/sip;Clear throat intermittently;Chin tuck  Postural Changes and/or Swallow Maneuvers (None)     CHL IP OTHER RECOMMENDATIONS 12/04/2014  Recommended Consults  (None)  Oral Care Recommendations Oral care BID  Other Recommendations Order thickener from pharmacy;Clarify dietary  restrictions     No flowsheet data found.   CHL IP FREQUENCY AND DURATION 12/04/2014  Speech Therapy Frequency (ACUTE ONLY) min 2x/week  Treatment Duration 2 weeks     Pertinent Vitals/Pain none    SLP Swallow Goals No flowsheet data found.  No flowsheet data found.    CHL IP REASON FOR REFERRAL 12/04/2014  Reason for Referral Objectively evaluate swallowing function  CHL IP ORAL PHASE 12/04/2014  Lips (None)  Tongue (None)  Mucous membranes (None)  Nutritional status (None)  Other (None)  Oxygen therapy (None)  Oral Phase Impaired  Oral - Pudding Teaspoon (None)  Oral - Pudding Cup (None)  Oral - Honey Teaspoon (None)  Oral - Honey Cup (None)  Oral - Honey Syringe (None)  Oral - Nectar Teaspoon (None)  Oral - Nectar Cup (None)  Oral - Nectar Straw (None)  Oral - Nectar Syringe (None)  Oral - Ice Chips (None)  Oral - Thin Teaspoon (None)  Oral - Thin Cup (None)  Oral - Thin Straw (None)  Oral - Thin Syringe (None)  Oral - Puree (None)  Oral - Mechanical Soft (None)  Oral - Regular (None)  Oral - Multi-consistency (None)  Oral - Pill (None)  Oral Phase - Comment (None)      CHL IP PHARYNGEAL PHASE 12/04/2014  Pharyngeal Phase Impaired  Pharyngeal - Pudding Teaspoon (None)  Penetration/Aspiration details (pudding teaspoon) (None)  Pharyngeal - Pudding Cup (None)  Penetration/Aspiration details (pudding cup) (None)  Pharyngeal - Honey Teaspoon (None)  Penetration/Aspiration details (honey teaspoon) (None)  Pharyngeal - Honey Cup (None)  Penetration/Aspiration details (honey cup) (None)  Pharyngeal - Honey Syringe (None)  Penetration/Aspiration details (honey syringe) (None)  Pharyngeal - Nectar Teaspoon (None)  Penetration/Aspiration details (nectar teaspoon) (None)  Pharyngeal - Nectar Cup (None)  Penetration/Aspiration details (nectar cup) (None)  Pharyngeal - Nectar Straw (None)   Penetration/Aspiration details (nectar straw) (None)  Pharyngeal - Nectar Syringe (None)  Penetration/Aspiration details (nectar syringe) (None)  Pharyngeal - Ice Chips (None)  Penetration/Aspiration details (ice chips) (None)  Pharyngeal - Thin Teaspoon (None)  Penetration/Aspiration details (thin teaspoon) (None)  Pharyngeal - Thin Cup (None)  Penetration/Aspiration details (thin cup) (None)  Pharyngeal - Thin Straw (None)  Penetration/Aspiration details (thin straw) (None)  Pharyngeal - Thin Syringe (None)  Penetration/Aspiration details (thin syringe') (None)  Pharyngeal - Puree (None)  Penetration/Aspiration details (puree) (None)  Pharyngeal - Mechanical Soft (None)  Penetration/Aspiration details (mechanical soft) (None)  Pharyngeal - Regular (None)  Penetration/Aspiration details (regular) (None)  Pharyngeal - Multi-consistency (None)  Penetration/Aspiration details (multi-consistency) (None)  Pharyngeal - Pill (None)  Penetration/Aspiration details (pill) (None)  Pharyngeal Comment (None)      CHL IP CERVICAL ESOPHAGEAL PHASE 12/04/2014  Cervical Esophageal Phase Impaired  Pudding Teaspoon (None)  Pudding Cup (None)  Honey Teaspoon (None)  Honey Cup (None)  Honey Straw (None)  Nectar Teaspoon Reduced cricopharyngeal relaxation;Esophageal backflow  into cervical esophagus  Nectar Cup Reduced cricopharyngeal relaxation;Esophageal backflow into  cervical esophagus  Nectar Straw (None)  Nectar Sippy Cup (None)  Thin Teaspoon (None)  Thin Cup Reduced cricopharyngeal relaxation;Esophageal backflow into  cervical esophagus  Thin Straw (None)  Thin Sippy Cup (None)  Cervical Esophageal Comment (None)    No flowsheet data found.         Kern Reap, MA, CCC-SLP 12/04/2014, 1:55 PM [id number]    Thurnell Lose, MD  [facility] Pager:[phone]  If 7PM-7AM, please contact night-coverage [email or web address] Password [email or web address] 12/16/2014, 11:12 AM   LOS: 14 days

## 2014-12-16 NOTE — Progress Notes (Signed)
Physical Therapy Treatment Patient Details Name: DARRIOUS YOUMAN MRN: [id number] DOB: 02/13/1946 Today's Date: 12/16/2014    History of Present Illness 69 y.o. male admitted to [facility] on 12/02/14 for cough, SOB, and weakness.  Dx with CAP, A-fib, weakness.  Pt with significant PMhx of anxiety, HTN, peripheral neuropathy, DM, diabetic charcot's foot, s/p surgery and wears shoes with inserts, and L toe amputation.  Pt now s/p thoracentesis and VATS, and ventillated.    PT Comments    Bed mobility and ROM/strengthening exercises this session. Pt up to chair earlier today with nursing.   Follow Up Recommendations  SNF;Supervision/Assistance - 24 hour     Equipment Recommendations  Rolling walker with 5" wheels    Recommendations for Other Services       Precautions / Restrictions Precautions Precautions: Fall Restrictions Weight Bearing Restrictions: No    Mobility  Bed Mobility Overal bed mobility: Needs Assistance Bed Mobility: Rolling Rolling: Mod assist         General bed mobility comments: Rolling to L and R. Mod cues for instruction.   Transfers                    Ambulation/Gait                 Stairs            Wheelchair Mobility    Modified Rankin (Stroke Patients Only)       Balance                                    Cognition Arousal/Alertness: Awake/alert Behavior During Therapy: WFL for tasks assessed/performed Overall Cognitive Status: Impaired/Different from baseline Area of Impairment: Orientation;Memory;Safety/judgement;Awareness;Following commands;Problem solving Orientation Level: Disoriented to;Place;Situation;Time Current Attention Level: Focused Memory: Decreased short-term memory Following Commands: Follows one step commands with increased time;Follows one step commands consistently Safety/Judgement: Decreased awareness of safety;Decreased awareness of deficits   Problem Solving: Difficulty  sequencing;Requires verbal cues;Requires tactile cues      Exercises General Exercises - Lower Extremity Ankle Circles/Pumps: AROM;15 reps;Both;Supine Quad Sets: AROM;Both;15 reps;Supine Heel Slides: AROM;Strengthening;Both;10 reps;Supine (manual resistance provided by therapist against flexion and extension) Hip ABduction/ADduction: AROM;Both;10 reps;Supine;Strengthening (manual resistance applied by therapist during abd)    General Comments        Pertinent Vitals/Pain Pain Assessment: No/denies pain    Home Living                      Prior Function            PT Goals (current goals can now be found in the care plan section) Progress towards PT goals: Progressing toward goals (slowly)    Frequency  Min 2X/week    PT Plan Current plan remains appropriate    Co-evaluation             End of Session   Activity Tolerance: Patient tolerated treatment well Patient left: in bed;with call bell/phone within reach;with nursing/sitter in room;with family/visitor present     Time: 6734-1937 PT Time Calculation (min) (ACUTE ONLY): 19 min  Charges:  $Gait Training: 8-22 mins                    G Codes:      Weston Anna, MPT Pager: [phone]

## 2014-12-16 NOTE — Consult Note (Signed)
Logan May [birth year]  [id number].   Requesting MD: Dr. Lala Lund Chief Complaint/Reason for Consult: open g-tube HPI: This is a 69 yo male with multiple medical problems including chronic dysphagia.  The patient was admitted with PNA and complicated by respiratory failure, a fib with RVR, and empyema that required a recent VATS.  Patient has had issues with swallowing.  He had an NGT placed for feeding but clinically appeared to still be aspirating his feeds and even possibly his oral secretions.  His NGT came out and his feedings have been stopped.  He has been clinically improving from a respiratory standpoint since this occurred.  A discussion was had with the patient and his wife who would like to proceed with a g-tube.  They asked IR, but due to an overlying colon, they can not proceed.  Therefore, we have been consulted for a surgical g-tube placement.  ROS : Please see HPI, otherwise he does have intermittent forgetfulness. Negative otherwise currently.  History reviewed. No pertinent family history.  Past Medical History  Diagnosis Date  . BPH (benign prostatic hypertrophy)   . Prostate cancer     implants   . Vitamin D deficiency     takes Vit D daily  . History of ETT 1999    Dr, Caleen Essex  . Anxiety     takes Valium daily as needed  . Hyperlipidemia     takes Simvastatin nightly  . Hypertension     takes Quinapril and Procardia daily  . Headache(784.0)     occasionally  . Peripheral neuropathy   . Diabetes mellitus, type 2     but doesn't take any meds for it;diet controlled and exercise  . 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 Pneumonia 1960's?  . CAP (community acquired pneumonia) 12/02/2014    Past Surgical History  Procedure Laterality Date  . Foot surgery  May 2002    Dr. Sharol Given   . Left foot casted 4-5 months    . Bilateral  eye laser surgery Bilateral 2000    "related to diabetes"  . Charcot foot left Left 12/15/01  . Laser eye surgery right  12/2002   . Eye surgery    . Tonsillectomy    . I&d extremity Left 09/05/2012    Procedure: IRRIGATION AND DEBRIDEMENT EXTREMITY;  Surgeon: Newt Minion, MD;  Location: [facility];  Service: Orthopedics;  Laterality: Left;  Excision Charcot Collapse Left Foot and Base 5th Metatarsal, Antibiotic Beads  . Esophagogastroduodenoscopy      at least 62yrs ago  . Amputation Left 05/20/2013    Procedure: AMPUTATION DIGIT;  Surgeon: Newt Minion, MD;  Location: [facility];  Service: Orthopedics;  Laterality: Left;  Left Great Toe Amputation at MTP  . Video assisted thoracoscopy (vats)/empyema N/A 12/05/2014    Procedure: VIDEO ASSISTED THORACOSCOPY (VATS)/EMPYEMA;  Surgeon: Melrose Nakayama, MD;  Location: [place];  Service: Thoracic;  Laterality: N/A;  . Video bronchoscopy N/A 12/05/2014    Procedure: VIDEO BRONCHOSCOPY;  Surgeon: Melrose Nakayama, MD;  Location: [facility];  Service: Thoracic;  Laterality: N/A;    Social History:  reports that he has never smoked. He has never used smokeless tobacco. He reports that he drinks about 25.2 oz of alcohol per week. He reports that he does not use illicit drugs.  Allergies:  Allergies  Allergen Reactions  . Tetanus Toxoids Other (See Comments)    "Blacked out"    Medications Prior to Admission  Medication Sig  Dispense Refill  . aspirin 81 MG tablet Take 81 mg by mouth daily as needed for pain.     . Cholecalciferol 2000 UNITS CAPS Take 2,000-4,000 Units by mouth daily. Takes 2000 units Monday- Friday , and 4000 units on Saturday and Sunday.    . diazepam (VALIUM) 10 MG tablet take 1 tablet by mouth every 12 hours if needed for anxiety 180 tablet 0  . fish oil-omega-3 fatty acids 1000 MG capsule Take 1 g by mouth every other day.     Marland Kitchen HYDROcodone-acetaminophen (NORCO/VICODIN) 5-325 MG per tablet Take 1 tablet by mouth every 6 (six) hours as needed for severe pain. 120 tablet 0  . NIFEdipine (PROCARDIA-XL/ADALAT-CC/NIFEDICAL-XL) 30 MG 24 hr tablet Take 1 tablet (30 mg  total) by mouth daily at 6 (six) AM. 90 tablet 3  . quinapril (ACCUPRIL) 40 MG tablet Take 1 tablet (40 mg total) by mouth 2 (two) times daily. 180 tablet 3  . simvastatin (ZOCOR) 40 MG tablet Take 1 tablet (40 mg total) by mouth every evening. 90 tablet 3    Blood pressure 127/60, pulse 101, temperature 98.7 F (37.1 C), temperature source Oral, resp. rate 18, height $RemoveBe'5\' 11"'bhbVWoKCw$  (1.803 m), weight 82.4 kg (181 lb 10.5 oz), SpO2 98 %. Physical Exam: General: pleasant, but elderly appearing white male who is laying in bed in NAD HEENT: head is normocephalic, atraumatic.  Sclera are noninjected.  Disconjugate gaze.  has glasses.  Ears and nose without any masses or lesions.  Mouth is pink  Heart: regular, rate, and rhythm.  Normal s1,s2. No obvious murmurs, gallops, or rubs noted.  Palpable radial and pedal pulses bilaterally Lungs: CTAB, no wheezes, rhonchi, or rales noted.  Respiratory effort nonlabored Abd: soft, NT, ND, +BS, no masses, hernias, or organomegaly MS: all 4 extremities are symmetrical with no cyanosis, clubbing, or edema.  Deformity noted of left foot Skin: warm and dry with no masses, lesions, or rashes Psych: A&Ox3 with an appropriate affect.    Results for orders placed or performed during the hospital encounter of 12/02/14 (from the past 48 hour(s))  Glucose, capillary     Status: Abnormal   Collection Time: 12/14/14  4:21 PM  Result Value Ref Range   Glucose-Capillary 118 (H) 65 - 99 mg/dL  Glucose, capillary     Status: Abnormal   Collection Time: 12/14/14  8:20 PM  Result Value Ref Range   Glucose-Capillary 116 (H) 65 - 99 mg/dL  Glucose, capillary     Status: Abnormal   Collection Time: 12/15/14 12:00 AM  Result Value Ref Range   Glucose-Capillary 135 (H) 65 - 99 mg/dL  Glucose, capillary     Status: Abnormal   Collection Time: 12/15/14  4:45 AM  Result Value Ref Range   Glucose-Capillary 108 (H) 65 - 99 mg/dL  Magnesium     Status: None   Collection Time:  12/15/14  6:12 AM  Result Value Ref Range   Magnesium 1.7 1.7 - 2.4 mg/dL  Basic metabolic panel     Status: Abnormal   Collection Time: 12/15/14  6:12 AM  Result Value Ref Range   Sodium 142 135 - 145 mmol/L   Potassium 3.4 (L) 3.5 - 5.1 mmol/L   Chloride 105 101 - 111 mmol/L   CO2 29 22 - 32 mmol/L   Glucose, Bld 106 (H) 65 - 99 mg/dL   BUN 7 6 - 20 mg/dL   Creatinine, Ser 0.94 0.61 - 1.24 mg/dL   Calcium 7.4 (  L) 8.9 - 10.3 mg/dL   GFR calc non Af Amer >60 >60 mL/min   GFR calc Af Amer >60 >60 mL/min    Comment: (NOTE) The eGFR has been calculated using the CKD EPI equation. This calculation has not been validated in all clinical situations. eGFR's persistently <60 mL/min signify possible Chronic Kidney Disease.    Anion gap 8 5 - 15  Glucose, capillary     Status: Abnormal   Collection Time: 12/15/14  7:49 AM  Result Value Ref Range   Glucose-Capillary 119 (H) 65 - 99 mg/dL  Glucose, capillary     Status: Abnormal   Collection Time: 12/15/14 11:52 AM  Result Value Ref Range   Glucose-Capillary 135 (H) 65 - 99 mg/dL  Glucose, capillary     Status: Abnormal   Collection Time: 12/15/14  4:05 PM  Result Value Ref Range   Glucose-Capillary 112 (H) 65 - 99 mg/dL  Glucose, capillary     Status: Abnormal   Collection Time: 12/15/14  8:01 PM  Result Value Ref Range   Glucose-Capillary 120 (H) 65 - 99 mg/dL  Glucose, capillary     Status: Abnormal   Collection Time: 12/16/14 12:06 AM  Result Value Ref Range   Glucose-Capillary 132 (H) 65 - 99 mg/dL   Comment 1 Notify RN   Glucose, capillary     Status: Abnormal   Collection Time: 12/16/14  3:52 AM  Result Value Ref Range   Glucose-Capillary 110 (H) 65 - 99 mg/dL  Basic metabolic panel     Status: Abnormal   Collection Time: 12/16/14  4:20 AM  Result Value Ref Range   Sodium 141 135 - 145 mmol/L   Potassium 3.3 (L) 3.5 - 5.1 mmol/L   Chloride 101 101 - 111 mmol/L   CO2 30 22 - 32 mmol/L   Glucose, Bld 121 (H) 65 - 99  mg/dL   BUN 6 6 - 20 mg/dL   Creatinine, Ser 0.99 0.61 - 1.24 mg/dL   Calcium 7.4 (L) 8.9 - 10.3 mg/dL   GFR calc non Af Amer >60 >60 mL/min   GFR calc Af Amer >60 >60 mL/min    Comment: (NOTE) The eGFR has been calculated using the CKD EPI equation. This calculation has not been validated in all clinical situations. eGFR's persistently <60 mL/min signify possible Chronic Kidney Disease.    Anion gap 10 5 - 15  Magnesium     Status: None   Collection Time: 12/16/14  4:20 AM  Result Value Ref Range   Magnesium 1.7 1.7 - 2.4 mg/dL  CBC     Status: Abnormal   Collection Time: 12/16/14  4:20 AM  Result Value Ref Range   WBC 9.3 4.0 - 10.5 K/uL   RBC 2.66 (L) 4.22 - 5.81 MIL/uL   Hemoglobin 7.6 (L) 13.0 - 17.0 g/dL   HCT 24.4 (L) 39.0 - 52.0 %   MCV 91.7 78.0 - 100.0 fL   MCH 28.6 26.0 - 34.0 pg   MCHC 31.1 30.0 - 36.0 g/dL   RDW 15.7 (H) 11.5 - 15.5 %   Platelets 378 150 - 400 K/uL  Glucose, capillary     Status: Abnormal   Collection Time: 12/16/14  7:41 AM  Result Value Ref Range   Glucose-Capillary 135 (H) 65 - 99 mg/dL  Glucose, capillary     Status: Abnormal   Collection Time: 12/16/14 12:08 PM  Result Value Ref Range   Glucose-Capillary 123 (H) 65 - 99 mg/dL  Ct Head Wo Contrast  12/15/2014   CLINICAL DATA:  Delirium secondary to alcohol withdrawal  EXAM: CT HEAD WITHOUT CONTRAST  TECHNIQUE: Contiguous axial images were obtained from the base of the skull through the vertex without intravenous contrast.  COMPARISON:  None.  FINDINGS: There is no evidence of mass effect, midline shift, or extra-axial fluid collections. There is no evidence of a space-occupying lesion or intracranial hemorrhage. There is no evidence of a cortical-based area of acute infarction. There is generalized cerebral atrophy. There is periventricular white matter low attenuation likely secondary to microangiopathy.  The ventricles and sulci are appropriate for the patient's age. The basal cisterns are  patent.  Visualized portions of the orbits are unremarkable. The visualized portions of the paranasal sinuses and mastoid air cells are unremarkable. Cerebrovascular atherosclerotic calcifications are noted.  The osseous structures are unremarkable.  IMPRESSION: No acute intracranial pathology.   Electronically Signed   By: Kathreen Devoid   On: 12/15/2014 16:05   Dg Chest Port 1 View  12/16/2014   CLINICAL DATA:  Pneumonia.  EXAM: PORTABLE CHEST - 1 VIEW  COMPARISON:  12/14/2014 .  FINDINGS: Interim removal of NG tube. Right IJ line stable position. Stable cardiomegaly. Persistent but partially clearing bilateral pulmonary infiltrates and bilateral pleural effusions. No pneumothorax .  IMPRESSION: 1. Interim removal of NG tube.  Right IJ line in stable position. 2. Persistent but partially clearing bilateral pulmonary infiltrates and bilateral pleural effusions .   Electronically Signed   By: Marcello Moores  Register   On: 12/16/2014 08:00       Assessment/Plan 1. Chronic dysphagia - patient and his wife did not fully understand what a g-tube was and what this entailed.  They had many questions.  The pros and cons along with risks associated with this were explained to the wife and patient.  Both had good questions.  They are not ready to make a decision just yet for whether they want to proceed with an open g-tube.  They will discuss this.  We will see him tomorrow to see if they have made a decision.  Zakkery Dorian E 12/16/2014, 3:18 PM Pager: [phone]

## 2014-12-16 NOTE — Progress Notes (Signed)
PT Cancellation Note  Patient Details Name: Logan May MRN: [id number] DOB: 1945-08-01   Cancelled Treatment:    Reason Eval/Treat Not Completed: Fatigue/lethargy limiting ability to participate. Pt asked PT to give him a little time to rest-just returned from CT. Will attempt to check back later today.    Weston Anna, MPT Pager: [phone]

## 2014-12-16 NOTE — Care Management Important Message (Signed)
Important Message  Patient Details  Name: Logan May MRN: [id number] Date of Birth: Oct 29, 1945   Medicare Important Message Given:  [facility] notification given    Nathen May 12/16/2014, 3:03 [place] Message  Patient Details  Name: Logan May MRN: [id number] Date of Birth: 1945/07/11   Medicare Important Message Given:  Yes-second notification given    Nathen May 12/16/2014, 3:03 PM

## 2014-12-17 DIAGNOSIS — R1310 Dysphagia, unspecified: Secondary | ICD-10-CM

## 2014-12-17 DIAGNOSIS — Z515 Encounter for palliative care: Secondary | ICD-10-CM | POA: Insufficient documentation

## 2014-12-17 LAB — COMPREHENSIVE METABOLIC PANEL
ALT: 10 U/L — ABNORMAL LOW (ref 17–63)
ANION GAP: 7 (ref 5–15)
AST: 20 U/L (ref 15–41)
Albumin: 1.6 g/dL — ABNORMAL LOW (ref 3.5–5.0)
Alkaline Phosphatase: 62 U/L (ref 38–126)
BILIRUBIN TOTAL: 0.6 mg/dL (ref 0.3–1.2)
BUN: 5 mg/dL — AB (ref 6–20)
CALCIUM: 7.3 mg/dL — AB (ref 8.9–10.3)
CO2: 32 mmol/L (ref 22–32)
CREATININE: 0.9 mg/dL (ref 0.61–1.24)
Chloride: 99 mmol/L — ABNORMAL LOW (ref 101–111)
Glucose, Bld: 150 mg/dL — ABNORMAL HIGH (ref 65–99)
Potassium: 3.3 mmol/L — ABNORMAL LOW (ref 3.5–5.1)
Sodium: 138 mmol/L (ref 135–145)
Total Protein: 5.4 g/dL — ABNORMAL LOW (ref 6.5–8.1)

## 2014-12-17 LAB — GLUCOSE, CAPILLARY
GLUCOSE-CAPILLARY: 132 mg/dL — AB (ref 65–99)
GLUCOSE-CAPILLARY: 139 mg/dL — AB (ref 65–99)
Glucose-Capillary: 121 mg/dL — ABNORMAL HIGH (ref 65–99)
Glucose-Capillary: 141 mg/dL — ABNORMAL HIGH (ref 65–99)
Glucose-Capillary: 143 mg/dL — ABNORMAL HIGH (ref 65–99)
Glucose-Capillary: 146 mg/dL — ABNORMAL HIGH (ref 65–99)
Glucose-Capillary: 156 mg/dL — ABNORMAL HIGH (ref 65–99)

## 2014-12-17 LAB — CBC
HCT: 24.6 % — ABNORMAL LOW (ref 39.0–52.0)
Hemoglobin: 7.7 g/dL — ABNORMAL LOW (ref 13.0–17.0)
MCH: 28.3 pg (ref 26.0–34.0)
MCHC: 31.3 g/dL (ref 30.0–36.0)
MCV: 90.4 fL (ref 78.0–100.0)
Platelets: 378 10*3/uL (ref 150–400)
RBC: 2.72 MIL/uL — AB (ref 4.22–5.81)
RDW: 15.6 % — ABNORMAL HIGH (ref 11.5–15.5)
WBC: 8.3 10*3/uL (ref 4.0–10.5)

## 2014-12-17 LAB — PROTIME-INR
INR: 1.22 (ref 0.00–1.49)
Prothrombin Time: 15.6 seconds — ABNORMAL HIGH (ref 11.6–15.2)

## 2014-12-17 LAB — MAGNESIUM: MAGNESIUM: 1.5 mg/dL — AB (ref 1.7–2.4)

## 2014-12-17 MED ORDER — LORAZEPAM 2 MG/ML PO CONC: 1.0000 mg | Freq: Four times a day (QID) | ORAL | Status: AC | PRN

## 2014-12-17 MED ORDER — MAGNESIUM SULFATE 2 GM/50ML IV SOLN
2.0000 g | Freq: Once | INTRAVENOUS | Status: AC
  Administered 2014-12-17: 2 g via INTRAVENOUS
  Filled 2014-12-17: qty 50

## 2014-12-17 MED ORDER — RESOURCE THICKENUP CLEAR PO POWD
ORAL | Status: DC | PRN
  Filled 2014-12-17 (×2): qty 125

## 2014-12-17 MED ORDER — POTASSIUM CHLORIDE 10 MEQ/100ML IV SOLN
10.0000 meq | INTRAVENOUS | Status: AC
  Administered 2014-12-17 (×6): 10 meq via INTRAVENOUS
  Filled 2014-12-17 (×6): qty 100

## 2014-12-17 MED ORDER — MORPHINE SULFATE (CONCENTRATE) 10 MG/0.5ML PO SOLN
10.0000 mg | ORAL | Status: AC | PRN
Start: 2014-12-17 — End: ?

## 2014-12-17 MED ORDER — SCOPOLAMINE 1 MG/3DAYS TD PT72: 1.0000 | MEDICATED_PATCH | TRANSDERMAL | Status: AC

## 2014-12-17 NOTE — Consult Note (Signed)
Consultation Note Date: 12/17/2014   Patient Name: Logan May  DOB: 12-27-1945  MRN: [id number]  Age / Sex: 69 y.o., male   PCP: Chipper Herb, MD Referring Physician: Thurnell Lose, MD  Reason for Consultation: Establishing goals of care  Palliative Care Assessment and Plan Summary of Established Goals of Care and Medical Treatment Preferences   Clinical Assessment/Narrative: Pt is a 69 yo man with h/o chronic aspiration adm in resp distress. He was found to have PNA, likely aspiration. He was placed on a BiPAP but this worsened his condition.. Then found to have an empyema and underwent decortication on 7/31 and was subsequently intubated with worsening resp status. His hospitalization was further complicated by a-fib with RVR as well as ETOH withdrawal. He required prescedex in the ICU. He is now extubated and out of ICU. NG TF stopped and he is actually doing some better in terms of pulmonary status and delirium. He is still very confused. He has a very congested cough and wife is suctioning him frequently. He has had a sitter with him most days and is wearing mitts to maintain lines. I met with wife and sister to discuss goals of care that would be important to pt. Principally family trying to decide on G-tube. Pt is not a candidate for G-tube to be place through IR but would require surgery. Medical impression is that pt at this point is aspirating on his own secretions. Reviewed the risks and benefits with wife and sister of G-tube in the setting of on-going aspiration, poor surgical candidate, pt's own comments that he would not a G-tube as well as high probability that he will re-aspirate in the very near future regardless of feeding tube placement. Per wife, the pt did not want to even come to the hospital for treatment  Contacts/Participants in Discussion: Primary Decision Maker: wife at this point   HCPOA: yes  Meeting with pt and wife and SIL, Libby  Code Status/Advance  Care Planning:  DNR. Wife is clear that were he found pulseless and not breathing to not try CPR.   No G-tube  Implement safest dysphagia diet  Understands on-going risk of aspiration PNA  Wants rehab in an attempt to " give him a chance". Wife desires to seeing if he can get stronger for him to come home. She understands that he has an up hill climb  Discussed that at this point he does qualify for hospice services. Wife will pursue rehab/skilled days first then likely elect his hospice benefit if he does not improve  She is overwhelmed with decision making at this point, and was unable to complete MOST form or discuss re-hospitalization goal, further antibiotics, or IVF. Wife and SIL given a copy of the MOST form for future review  Symptom Management:   Secretions: As pt is going to eat for comfort, would not use drying agents such as atropine but would cont and try and mobilize  Additional See abovePsycho-social/Spiritual:   Support System: yes  Desire for further Chaplaincy support:no  Prognosis: < 6 months  Discharge Planning:  [facility] for rehab with Palliative care service follow-up       Chief Complaint/History of Present Illness: Pt is a 69 yo man who was falling at home, progressive cough, brought to hospital with cough, purulent sputum and dyspnea  Primary Diagnoses  Present on Admission:  . CAP (community acquired pneumonia) . Atrial fibrillation . Diabetes mellitus type 2, controlled, with complications . Prolonged Q-T  interval on ECG . Severe sepsis . Hypertension . Hyperlipidemia . Generalized anxiety disorder . Pressure ulcer . Pleural effusion . HCAP (healthcare-associated pneumonia) . Empyema of right pleural space  Palliative Review of Systems: Pt confused I have reviewed the medical record, interviewed the patient and family, and examined the patient. The following aspects are pertinent.  Past Medical History  Diagnosis Date    . BPH (benign prostatic hypertrophy)   . Prostate cancer     implants   . Vitamin D deficiency     takes Vit D daily  . History of ETT 1999    Dr, Caleen Essex  . Anxiety     takes Valium daily as needed  . Hyperlipidemia     takes Simvastatin nightly  . Hypertension     takes Quinapril and Procardia daily  . Headache(784.0)     occasionally  . Peripheral neuropathy   . Diabetes mellitus, type 2     but doesn't take any meds for it;diet controlled and exercise  . 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 Pneumonia 1960's?  . CAP (community acquired pneumonia) 12/02/2014   Social History   Social History  . Marital Status: Married    Spouse Name: N/A  . Number of Children: N/A  . Years of Education: N/A   Social History Main Topics  . Smoking status: Never Smoker   . Smokeless tobacco: Never Used  . Alcohol Use: 25.2 oz/week    42 Cans of beer per week     Comment: 12/02/2014 "maybe 6 pack of beer/day; on days when he's feeling good"  . Drug Use: No  . Sexual Activity: No   Other Topics Concern  . None   Social History Narrative   History reviewed. No pertinent family history. Scheduled Meds: . aspirin  150 mg Rectal Daily  . atorvastatin  20 mg Oral q1800  . cefTRIAXone (ROCEPHIN)  IV  1 g Intravenous Q24H  . cholecalciferol  2,000 Units Oral Once per day on Mon Tue Wed Thu Fri  . cholecalciferol  4,000 Units Oral Once per day on Sun Sat  . [START ON 12/18/2014] enoxaparin (LOVENOX) injection  40 mg Subcutaneous Q24H  . folic acid  1 mg Intravenous Daily  . insulin aspart  0-15 Units Subcutaneous 6 times per day  . ipratropium-albuterol  3 mL Nebulization TID  . metoprolol tartrate  25 mg Oral BID  . potassium chloride  10 mEq Intravenous Q1 Hr x 6  . scopolamine  1 patch Transdermal Q72H  . senna-docusate  1 tablet Oral QHS  . thiamine IV  100 mg Intravenous Daily   Continuous Infusions: . dextrose 5 % and 0.45% NaCl 1,000 mL (12/17/14 0832)   PRN  Meds:.acetaminophen, albuterol, fentaNYL (SUBLIMAZE) injection, Gerhardt's butt cream, ipratropium-albuterol, LORazepam, metoprolol, ondansetron (ZOFRAN) IV, RESOURCE THICKENUP CLEAR, sodium chloride Medications Prior to Admission:  Prior to Admission medications   Medication Sig Start Date End Date Taking? Authorizing Provider  aspirin 81 MG tablet Take 81 mg by mouth daily as needed for pain.    Yes Historical Provider, MD  Cholecalciferol 2000 UNITS CAPS Take 2,000-4,000 Units by mouth daily. Takes 2000 units Monday- Friday , and 4000 units on Saturday and Sunday.   Yes Historical Provider, MD  diazepam (VALIUM) 10 MG tablet take 1 tablet by mouth every 12 hours if needed for anxiety 09/15/14  Yes Chipper Herb, MD  fish oil-omega-3 fatty acids 1000 MG capsule Take 1 g by mouth every  other day.    Yes Historical Provider, MD  HYDROcodone-acetaminophen (NORCO/VICODIN) 5-325 MG per tablet Take 1 tablet by mouth every 6 (six) hours as needed for severe pain. 11/17/14  Yes Chipper Herb, MD  NIFEdipine (PROCARDIA-XL/ADALAT-CC/NIFEDICAL-XL) 30 MG 24 hr tablet Take 1 tablet (30 mg total) by mouth daily at 6 (six) AM. 05/26/14  Yes Chipper Herb, MD  quinapril (ACCUPRIL) 40 MG tablet Take 1 tablet (40 mg total) by mouth 2 (two) times daily. 05/26/14  Yes Chipper Herb, MD  simvastatin (ZOCOR) 40 MG tablet Take 1 tablet (40 mg total) by mouth every evening. 05/26/14  Yes Chipper Herb, MD   Allergies  Allergen Reactions  . Tetanus Toxoids Other (See Comments)    "Blacked out"   CBC:    Component Value Date/Time   WBC 8.3 12/17/2014 0400   WBC 7.9 05/26/2014 0924   WBC 9.1 12/02/2013 1005   HGB 7.7* 12/17/2014 0400   HGB 14.4 12/02/2013 1005   HCT 24.6* 12/17/2014 0400   HCT 44.0 12/02/2013 1005   PLT 378 12/17/2014 0400   MCV 90.4 12/17/2014 0400   MCV 92.7 12/02/2013 1005   NEUTROABS 12.1* 12/05/2014 0517   NEUTROABS 5.2 05/26/2014 0924   LYMPHSABS 1.1 12/05/2014 0517   LYMPHSABS 2.0  05/26/2014 0924   MONOABS 0.7 12/05/2014 0517   EOSABS 0.0 12/05/2014 0517   EOSABS 0.1 05/26/2014 0924   BASOSABS 0.0 12/05/2014 0517   BASOSABS 0.0 05/26/2014 0924   Comprehensive Metabolic Panel:    Component Value Date/Time   NA 138 12/17/2014 0400   NA 140 05/26/2014 0923   K 3.3* 12/17/2014 0400   CL 99* 12/17/2014 0400   CO2 32 12/17/2014 0400   BUN 5* 12/17/2014 0400   BUN 12 05/26/2014 0923   CREATININE 0.90 12/17/2014 0400   CREATININE 0.97 11/26/2012 1131   GLUCOSE 150* 12/17/2014 0400   GLUCOSE 172* 05/26/2014 0923   CALCIUM 7.3* 12/17/2014 0400   AST 20 12/17/2014 0400   ALT 10* 12/17/2014 0400   ALKPHOS 62 12/17/2014 0400   BILITOT 0.6 12/17/2014 0400   PROT 5.4* 12/17/2014 0400   PROT 7.1 05/26/2014 0923   ALBUMIN 1.6* 12/17/2014 0400    Physical Exam: Vital Signs: BP 142/72 mmHg  Pulse 110  Temp(Src) 98.7 F (37.1 C) (Oral)  Resp 23  Ht $R'5\' 11"'wk$  (1.803 m)  Wt 81.5 kg (179 lb 10.8 oz)  BMI 25.07 kg/m2  SpO2 98% SpO2: SpO2: 98 % O2 Device: O2 Device: Nasal Cannula O2 Flow Rate: O2 Flow Rate (L/min): 2 L/min Intake/output summary:  Intake/Output Summary (Last 24 hours) at 12/17/14 1259 Last data filed at 12/17/14 0500  Gross per 24 hour  Intake   1171 ml  Output    927 ml  Net    244 ml   LBM: Last BM Date: 12/16/14 Baseline Weight: Weight: 81.647 kg (180 lb) Most recent weight: Weight: 81.5 kg (179 lb 10.8 oz)  Exam Findings:  General: Well nourished older man. Confused, somnolent Resp: Upper airway secretions. Sounds very wet. Frequent suctioning Neuro: Fluctuating LOC, confused         Palliative Performance Scale: 30%              Additional Data Reviewed: Recent Labs     12/16/14  0420  12/17/14  0400  WBC  9.3  8.3  HGB  7.6*  7.7*  PLT  378  378  NA  141  138  BUN  6  5*  CREATININE  0.99  0.90     Time In: 0930 Time Out: 1130 Time Total: 120 Greater than 50%  of this time was spent counseling and coordinating care  related to the above assessment and plan.  Signed by: Dory Horn, NP  Dory Horn, NP  12/17/2014, 12:59 PM  Please contact Palliative Medicine Team phone at [phone] for questions and concerns.

## 2014-12-17 NOTE — Discharge Summary (Signed)
Logan May, is a 69 y.o. male  DOB 04/23/1946  MRN [id number].  Admission date:  12/02/2014  Admitting Physician  Eugenie Filler, MD  Discharge Date:  12/19/2014   Primary MD  Redge Gainer, MD  Recommendations for primary care physician for things to follow:   Comfort directed care - residential hospice   Admission Diagnosis  Altered level of consciousness [R40.4]   Discharge Diagnosis  Altered level of consciousness [R40.4]    Principal Problem:   Severe sepsis Active Problems:   Diabetes mellitus type 2, controlled, with complications   Hypertension   Hyperlipidemia   Generalized anxiety disorder   CAP (community acquired pneumonia)   Atrial fibrillation   Prolonged Q-T interval on ECG   Weakness   Pressure ulcer   Pleural effusion   HCAP (healthcare-associated pneumonia)   Altered level of consciousness   Empyema of right pleural space   At high risk for falls   Palliative care encounter   Dysphagia      Past Medical History  Diagnosis Date  . BPH (benign prostatic hypertrophy)   . Prostate cancer     implants   . Vitamin D deficiency     takes Vit D daily  . History of ETT 1999    Dr, Caleen Essex  . Anxiety     takes Valium daily as needed  . Hyperlipidemia     takes Simvastatin nightly  . Hypertension     takes Quinapril and Procardia daily  . Headache(784.0)     occasionally  . Peripheral neuropathy   . Diabetes mellitus, type 2     but doesn't take any meds for it;diet controlled and exercise  . 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 Pneumonia 1960's?  . CAP (community acquired pneumonia) 12/02/2014    Past Surgical History  Procedure Laterality Date  . Foot surgery  May 2002    Dr. Sharol Given   . Left foot casted 4-5 months    . Bilateral  eye laser surgery Bilateral 2000     "related to diabetes"  . Charcot foot left Left 12/15/01  . Laser eye surgery right  12/2002  . Eye surgery    . Tonsillectomy    . I&d extremity Left 09/05/2012    Procedure: IRRIGATION AND DEBRIDEMENT EXTREMITY;  Surgeon: Newt Minion, MD;  Location: [place];  Service: Orthopedics;  Laterality: Left;  Excision Charcot Collapse Left Foot and Base 5th Metatarsal, Antibiotic Beads  . Esophagogastroduodenoscopy      at least 4yrs ago  . Amputation Left 05/20/2013    Procedure: AMPUTATION DIGIT;  Surgeon: Newt Minion, MD;  Location: [place];  Service: Orthopedics;  Laterality: Left;  Left Great Toe Amputation at MTP  . Video assisted thoracoscopy (vats)/empyema N/A 12/05/2014    Procedure: VIDEO ASSISTED THORACOSCOPY (VATS)/EMPYEMA;  Surgeon: Melrose Nakayama, MD;  Location: [place];  Service: Thoracic;  Laterality: N/A;  . Video bronchoscopy N/A 12/05/2014    Procedure: VIDEO BRONCHOSCOPY;  Surgeon: Revonda Standard  Roxan Hockey, MD;  Location: [place];  Service: Thoracic;  Laterality: N/A;       HPI  from the history and physical done on the day of admission:    69 year old male with history of type 2 diabetes with Charcot foot-s/p amputation on toe of left foot, history of prostate cancer, hypertension, chronic dysphagia, EtOH abuse, history of atrial fibrillation not on anticoagulation due to fall risk (per H&P) presented to the hospital on 7/28 with sepsis from pneumonia and associated empyema. PCCM was consulted, underwent thoracocentesis on 7/30-pleural fluid was consistent with an empyema. Subsequently cardiothoracic surgery was consulted, and a right sided VATS, drainage of empyema and visceral/pleural decortication was done on 7/31. Patient subsequently developed respiratory failure and required mechanical ventilation till 8/3. ICU course was complicated by development of atrial fibrillation with RVR requiring amiodarone infusion and severe delirium thought to be secondary to alcohol withdrawal. Upon  clinical stability, patient was transferred to the hospitalist service on 8/6.     Hospital Course:    Severe sepsis from right-sided pneumonia and empyema likely due to underlying micro-aspiration: With acute hypoxic respiratory failure requiring intubation.  Sepsis pathophysiology has resolved following VATS/decortication. He required brief intubation and ICU stay was extubated on 12/07/2014. Blood culture neg, but tissue culture positive for microaerophilic streptococcus will taper down to Rocephin at this point.  He continues to exhibit signs of aspiration, despite NG tube feeding patient is clinically aspirating and I suspect he is even aspirating his oral secretions, therapy on board, I discontinued NG tube on 12/14/2014, Had added anti-suction and pulmonary toiletry, he shows some clinical improvement since NG tube was discontinued and pulm toiletry was initiated.  He has failed 3 evaluations by speech therapist from 8/9 to 12/16/2014, after detailed discussions with wife, pulmonary physician Dr. Marianna Payment at [facility], speech therapist and input by Dr. Roxan Hockey cardiothoracic surgery. Initially family wanted PEG tube placement. However there was clear evidence that patient was unable to keep his oral secretions out of his airway and that even after PEG tube he will continue to aspirate.  Palliative care was consulted, detailed discussions at multiple different times happened between family, patient, palliative care, interventional radiology and even general surgery for PEG tube was his gastrostomy tube placement. Finally patient and family decided that they would want comfort feeds and residential hospice.    They understand that he will continue to aspirate and decline and that best course of action for him at this point will be comfort care only. He will be discharged to residential hospice now. All unnecessary medications will be stopped only comfort directed medications continued.    Right  sided Empyema I suspect from long-standing microaspiration: underwent VATS, drainage of empyema and visceral/pleural decortication on 7/31.Tissue culture 7/31 positive for Microaerophilic streptococci, AFB culture/fungal culture negative so far. ABX adjusted based on cultures, he has finished his anti-biotic course.   Severe Dysphagia: Suspect long-term microaspiration, see #1 above.   Acute Urinary Retention:underwent Flexible cytoscopy and passage of foley catheter by Dr Gaynelle Arabian on 7/31, will continue.   Afib YOV:ZCHYIFOY Amiodarone gtt for rate control, now on prn lopressor. Felt to be a poor candidate for long term anticoagulation-although has a CHADS2Vasc SCORE of 3.  Only comfort medications now.   ETOH withdrawal with delirium and metabolic encephalopathy: Had DTs in ICU, now stable. Counseled to quit alcohol.   Enterococcal UTI: Seen on urine culture 7/29-this is pansensitive. Has been treated with Zosyn.   Left upper Ext swelling: stable Doppler  Anemia: likely secondary to critical illness.   Hx of COPD with emphysema:continue supportive care now.   Dyslipidemia: Only comfort medications, stopped Statin   Severe Deconditioning: Residential hospice   Type 2 DM: Comfort meds only with supportive care        Discharge Condition: poor  Follow UP  Follow-up Information    Follow up with Redge Gainer, MD. Schedule an appointment as soon as possible for a visit in 1 week.   Specialty:  Family Medicine   Contact information:   [place] [address] [phone]       Follow up with Ailene Rud, MD. Schedule an appointment as soon as possible for a visit in 1 week.   Specialty:  Urology   Why:  Bladder outlet obstruction   Contact information:   [place] [address] [phone]       Follow up with Melrose Nakayama, MD. Schedule an appointment as soon as possible for a visit in 2 weeks.   Specialty:   Cardiothoracic Surgery   Why:  VATS followup   Contact information:   [address] [place] [address] [phone]        Consults obtained -  pulmonary critical care, cardiothoracic surgery, interventional radiology, Gen. surgery, palliative care  Diet and Activity recommendation: See Discharge Instructions below  Discharge Instructions           Discharge Instructions    Discharge instructions    Complete by:  As directed   Follow with Primary MD Redge Gainer, MD as desired. Goal of care is comfort at residential hospice.   Activity: As tolerated with Full fall precautions use walker/cane & assistance as needed   Disposition residential hospice   Diet: Comfort feeds. Dysphagia 1 diet with nectar thick liquids, requires full feeding assistance and aspiration precautions.     Increase activity slowly    Complete by:  As directed              Discharge Medications       Medication List    STOP taking these medications        aspirin 81 MG tablet     Cholecalciferol 2000 UNITS Caps     diazepam 10 MG tablet  Commonly known as:  VALIUM     fish oil-omega-3 fatty acids 1000 MG capsule     HYDROcodone-acetaminophen 5-325 MG per tablet  Commonly known as:  NORCO/VICODIN     NIFEdipine 30 MG 24 hr tablet  Commonly known as:  PROCARDIA-XL/ADALAT-CC/NIFEDICAL-XL     quinapril 40 MG tablet  Commonly known as:  ACCUPRIL     simvastatin 40 MG tablet  Commonly known as:  ZOCOR      TAKE these medications        LORazepam 2 MG/ML concentrated solution  Commonly known as:  ATIVAN  Take 0.5 mLs (1 mg total) by mouth every 6 (six) hours as needed for anxiety.     metoprolol tartrate 25 MG tablet  Commonly known as:  LOPRESSOR  Take 1 tablet (25 mg total) by mouth 2 (two) times daily.     morphine CONCENTRATE 10 MG/0.5ML Soln concentrated solution  Take 0.5 mLs (10 mg total) by mouth every 3 (three) hours as needed for moderate pain or  severe pain.     scopolamine 1 MG/3DAYS  Commonly known as:  TRANSDERM-SCOP (1.5 MG)  Place 1 patch (1.5 mg total) onto the skin  every 3 (three) days.        Major procedures and Radiology Reports - PLEASE review detailed and final reports for all details, in brief -     Ct Abdomen Wo Contrast  12/16/2014   CLINICAL DATA:  Evaluate anatomy for possible gastrostomy tube placement. History of dysphagia.  EXAM: CT ABDOMEN WITHOUT CONTRAST  TECHNIQUE: Multidetector CT imaging of the abdomen was performed following the standard protocol without IV contrast.  COMPARISON:  12/12/2014  FINDINGS: There are bilateral pleural effusions, left side greater the lack. There is volume loss in the left lower lobe with consolidation and volume loss in the right lower lobe. Evidence for coronary artery calcifications.  There is no significant hiatal hernia. The transverse colon is anterior to the stomach and liver in the upper abdomen. Few small bowel loops in the left upper abdomen. No significant dilatation of small or large bowel.  No gross abnormality to the liver. There are calcifications in the gallbladder, compatible with cholelithiasis. No significant gallbladder distention. Normal appearance of the spleen. Atherosclerotic calcifications in the splenic artery. Normal appearance of the adrenal glands. Mild perinephric stranding bilaterally which may be chronic. No significant hydronephrosis or kidney stones. Atherosclerotic calcifications in the aorta and visceral arteries without aneurysm. No significant lymphadenopathy in the upper abdomen.  There is severe disc space narrowing and endplate disease in the lumbar spine involving L2-L5. Complete loss of the disc space at L3-L4.  IMPRESSION: The transverse colon is located in the anterior upper abdomen. The location of the transverse colon may be problematic for placement of a percutaneous gastrostomy tube.  Bilateral pleural effusions with consolidation in the  right lower lobe. Cannot exclude an infectious or inflammatory process in the right lower lobe.  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   Electronically Signed   By: Markus Daft M.D.   On: 12/16/2014 15:28   Dg Chest 1 View  12/04/2014   CLINICAL DATA:  Post right thoracentesis  EXAM: CHEST  1 VIEW  COMPARISON:  12/02/2014  FINDINGS: Diffuse right lung airspace disease again noted, slightly improved. Decreasing right effusion following thoracentesis. No pneumothorax. Heart is borderline in size. Nodular density projecting over the left lung base is felt represent nipple shadow. Left lung is clear. No acute bony abnormality.  IMPRESSION: Decreasing right effusion and diffuse right lung airspace disease. No pneumothorax.   Electronically Signed   By: Rolm Baptise M.D.   On: 12/04/2014 13:10   Dg Chest 2 View  12/02/2014   CLINICAL DATA:  Altered level of consciousness.  EXAM: CHEST  2 VIEW  COMPARISON:  Sep 04, 2012.  FINDINGS: The heart size and mediastinal contours are within normal limits. No pneumothorax is noted. Left lung is clear. There is now noted diffuse opacification of the right lower lobe most consistent with pneumonia with associated pleural effusion. The visualized skeletal structures are unremarkable.  IMPRESSION: Diffuse opacification of the right lower lobe most consistent with pneumonia with associated pleural effusion. Follow-up radiographs are recommended.   Electronically Signed   By: Marijo Conception, M.D.   On: 12/02/2014 10:57   Dg Abd 1 View  12/12/2014   CLINICAL DATA:  Feeding tube placement  EXAM: ABDOMEN - 1 VIEW  COMPARISON:  12/10/2014  FINDINGS: Tip of feeding tube projects over proximal stomach.  Air-filled upper normal caliber loops of small bowel.  Scattered colonic gas.  No evidence of bowel obstruction or dilatation.  Scattered degenerative disc disease changes lumbar spine.  Foley catheter in urinary bladder.  IMPRESSION: Tip of feeding tube projects over proximal stomach.    Electronically Signed   By: Lavonia Dana M.D.   On: 12/12/2014 10:05   Ct Head Wo Contrast  12/15/2014   CLINICAL DATA:  Delirium secondary to alcohol withdrawal  EXAM: CT HEAD WITHOUT CONTRAST  TECHNIQUE: Contiguous axial images were obtained from the base of the skull through the vertex without intravenous contrast.  COMPARISON:  None.  FINDINGS: There is no evidence of mass effect, midline shift, or extra-axial fluid collections. There is no evidence of a space-occupying lesion or intracranial hemorrhage. There is no evidence of a cortical-based area of acute infarction. There is generalized cerebral atrophy. There is periventricular white matter low attenuation likely secondary to microangiopathy.  The ventricles and sulci are appropriate for the patient's age. The basal cisterns are patent.  Visualized portions of the orbits are unremarkable. The visualized portions of the paranasal sinuses and mastoid air cells are unremarkable. Cerebrovascular atherosclerotic calcifications are noted.  The osseous structures are unremarkable.  IMPRESSION: No acute intracranial pathology.   Electronically Signed   By: Kathreen Devoid   On: 12/15/2014 16:05   Ct Chest Wo Contrast  12/04/2014   CLINICAL DATA:  Right hemithorax opacification, assess hemothorax, effusion and loculation. Review of electronic records demonstrates history of productive cough and shortness of breath for 3 weeks. Recent falls.  EXAM: CT CHEST WITHOUT CONTRAST  TECHNIQUE: Multidetector CT imaging of the chest was performed following the standard protocol without IV contrast.  COMPARISON:  Chest radiograph 12/02/2014  FINDINGS: Large volume of loculated right pleural fluid. Within the right lower hemithorax are multiple foci of air within the large volume pleural fluid raising concern for empyema. There is mass effect on the adjacent bronchi, with compressive atelectasis of the dependent right upper lobe and consolidation in the right middle lobe  with air bronchograms. The origin of the right lower lobe bronchus is not well-defined, and no aerated right lower lobe lung is confidently identified. Pleural fluid is heterogeneous in density.  There is a small left pleural effusion that appears simple fluid in density. Adjacent compressive atelectasis in the lower lobe. The left upper lobe is clear.  Heart at the upper limits of normal in size. There are dense coronary artery calcifications. No definite pericardial fluid. Prominent small superior mediastinal lymph nodes measuring 9 mm short axis dimension. Limited assessment for hilar adenopathy. Mild atherosclerosis of normal caliber thoracic aorta.  No definite acute abnormality in the included upper abdomen, perinephric stranding is partially included.  There are no acute rib fractures. Remote nondisplaced lower right rib fractures with well-formed callus. Age related degenerative change throughout spine. No blastic or destructive lytic osseous lesions.  IMPRESSION: 1. Large partially loculated heterogeneous right pleural effusion. In the lower portion are multiple foci of air. Findings are most concerning for empyema. There is consolidation or atelectasis of the entire right lower lobe, consolidation with air bronchograms in the right middle lobe, and compressive atelectasis of the adjacent right upper lobe. Hemothorax is felt less likely, given the right lower rib fractures appear remote. 2. Small left pleural effusion with adjacent compressive atelectasis. 3. Dense coronary artery calcifications.   Electronically Signed   By: Jeb Levering M.D.   On: 12/04/2014 02:42   Dg Chest Port 1 View  12/16/2014   CLINICAL DATA:  Pneumonia.  EXAM: PORTABLE CHEST - 1 VIEW  COMPARISON:  12/14/2014 .  FINDINGS: Interim removal of NG tube. Right  IJ line stable position. Stable cardiomegaly. Persistent but partially clearing bilateral pulmonary infiltrates and bilateral pleural effusions. No pneumothorax .   IMPRESSION: 1. Interim removal of NG tube.  Right IJ line in stable position. 2. Persistent but partially clearing bilateral pulmonary infiltrates and bilateral pleural effusions .   Electronically Signed   By: Marcello Moores  Register   On: 12/16/2014 08:00   Dg Chest Port 1 View  12/14/2014   CLINICAL DATA:  Pneumonia.  EXAM: PORTABLE CHEST - 1 VIEW  COMPARISON:  12/11/2014  FINDINGS: Right jugular catheter tip in the SVC unchanged. Feeding tube tip in the proximal stomach.  Right lower lobe airspace disease unchanged with small right effusion  Left lower lobe consolidation unchanged, with left effusion  Cardiac enlargement with mild vascular congestion.  IMPRESSION: Right lower lobe infiltrate and effusion, worrisome for pneumonia  Left lower lobe consolidation and effusion possibly atelectasis or pneumonia  Pulmonary vascular congestion.   Electronically Signed   By: Franchot Gallo M.D.   On: 12/14/2014 08:00   Dg Chest Port 1 View  12/11/2014   CLINICAL DATA:  69 year old male with history of empyema.  EXAM: PORTABLE CHEST - 1 VIEW  COMPARISON:  Chest x-ray 12/10/2014.  FINDINGS: Previously noted right-sided chest tube has been removed. No appreciable right pneumothorax. Right IJ catheter with tip terminating in the distal superior vena cava. Feeding tube extending into the upper abdomen, with tip below the lower margin of the image. Lung volumes are slightly low. Bibasilar opacities may reflect areas of atelectasis and/or consolidation. Small to moderate bilateral pleural effusions. Cephalization of pulmonary vasculature. Mild cardiomegaly. Upper mediastinal contours are slightly distorted by patient positioning.  IMPRESSION: 1. Support apparatus, as above. 2. No definite right pneumothorax following right-sided chest tube removal. 3. Bibasilar opacities may reflect areas of atelectasis and/or consolidation, with superimposed small to moderate bilateral pleural effusions. 4. Cardiomegaly with pulmonary venous  congestion, but no frank pulmonary edema.   Electronically Signed   By: Vinnie Langton M.D.   On: 12/11/2014 08:36   Dg Chest Port 1 View  12/10/2014   CLINICAL DATA:  69 year old male status post VATS for empyema. Initial encounter.  EXAM: PORTABLE CHEST - 1 VIEW  COMPARISON:  12/09/2014 and earlier.  FINDINGS: Portable AP semi upright view at 0710 hrs. Enteric feeding tube is been placed, tip not included. 1 of the 2 right chest tubes has been removed. Single residual right side chest tube and right IJ central line appear stable. Stable lung volumes. Stable cardiac size and mediastinal contours. Residual peripheral and basilar opacity in the right lung is stable. No pneumothorax. Left lower lobe atelectasis.  IMPRESSION: 1. One right chest tube removed, 1 remains. No pneumothorax identified. 2. Feeding tube placed, tip not included. 3. Stable right lung ventilation. Left lung remarkable for lower lobe atelectasis.   Electronically Signed   By: Genevie Ann M.D.   On: 12/10/2014 07:49   Dg Chest Port 1 View  12/09/2014   CLINICAL DATA:  Post VATS, empyema, history prostate cancer, hypertension, diabetes mellitus  EXAM: PORTABLE CHEST - 1 VIEW  COMPARISON:  Portable exam 0647 hours compared 12/08/2014  FINDINGS: RIGHT jugular central venous catheter with tip projecting over SVC.  Pair of RIGHT thoracostomy tubes unchanged.  Mild enlargement of cardiac silhouette.  Stable mediastinal contours and pulmonary vascularity.  Mild residual pleural-based opacity in the RIGHT hemi thorax with persisting consolidation in RIGHT lower lobe.  LEFT lung clear.  No pneumothorax.  IMPRESSION: Stable RIGHT  thoracostomy tubes with persistent RIGHT lower lobe consolidation and mild residual pleural-based opacity.   Electronically Signed   By: Lavonia Dana M.D.   On: 12/09/2014 07:55   Dg Chest Port 1 View  12/08/2014   CLINICAL DATA:  Empyema  EXAM: PORTABLE CHEST - 1 VIEW  COMPARISON:  12/07/2014  FINDINGS: Three chest tubes on the  right are unchanged in position. No pneumothorax. Right lower lobe infiltrate. Minimal right effusion.  Left lower lobe atelectasis has progressed.  Negative for heart failure. Right jugular catheter tip in the SVC. Endotracheal tube and NG tubes removed.  IMPRESSION: Three chest tubes remain on the right without pneumothorax. Right lower lobe consolidation remains.  Increase in left lower lobe atelectasis.  Endotracheal tube removed in the interval.   Electronically Signed   By: Franchot Gallo M.D.   On: 12/08/2014 08:03   Dg Chest Port 1 View  12/07/2014   CLINICAL DATA:  Empyema.  EXAM: PORTABLE CHEST - 1 VIEW  COMPARISON:  12/06/2014  FINDINGS: The endotracheal tube tip is just below the clavicular heads. The orogastric tube reaches the stomach at least. Right IJ central line, tip at the distal SVC.  3 right-sided thoracostomy tubes are in stable position. There is no definitive pneumothorax. Compared to preoperative study, pleural effusion remains decreased. Right basilar pneumonia is stable. Streaky left lower lobe opacity has slowly increased over the past 2 days, consistent with atelectasis. No pulmonary edema.  IMPRESSION: 1. Stable positioning of tubes and central line. 2. Recent right empyema drainage with no pneumothorax or increasing pleural fluid. 3. Stable right basilar pneumonia and left basilar atelectasis.   Electronically Signed   By: Monte Fantasia M.D.   On: 12/07/2014 07:41   Portable Chest Xray  12/06/2014   CLINICAL DATA:  Hypoxia  EXAM: PORTABLE CHEST - 1 VIEW  COMPARISON:  December 05, 2014  FINDINGS: Endotracheal tube tip is 5.3 cm above the carina. Central catheter tip is in the superior cava. Nasogastric tube tip and side port are below the diaphragm. Chest tubes are again noted on the right, unchanged in position. There is no appreciable pneumothorax. There is slightly less airspace consolidation in the right lower lung zone compared to 1 day prior. There is a mild degree of effusion  on the right, stable. The left lung is essentially clear. Heart size and pulmonary vascularity are normal. No adenopathy.  IMPRESSION: Tube and catheter positions as described without pneumothorax. Partial clearing of consolidation right base. No new opacity. Small right effusion. Left lung clear. No change in cardiac silhouette.   Electronically Signed   By: Lowella Grip III M.D.   On: 12/06/2014 07:42   Dg Chest Port 1 View  12/05/2014   CLINICAL DATA:  Acute respiratory failure, hypoxia.  EXAM: PORTABLE CHEST - 1 VIEW  COMPARISON:  12/05/2014  FINDINGS: Right chest tubes and remainder support devices remain in place, unchanged. No pneumothorax. Interval placement of the NG tube into the stomach. Patchy right lung airspace disease slightly worsened since prior study. No confluent opacity on the left. Heart is normal size.  IMPRESSION: Worsening right lung airspace disease. Right chest tubes remain in place without pneumothorax.   Electronically Signed   By: Rolm Baptise M.D.   On: 12/05/2014 16:36   Dg Chest Port 1 View  12/05/2014   CLINICAL DATA:  Pneumothorax.  EXAM: PORTABLE CHEST - 1 VIEW  COMPARISON:  Radiograph 12/04/2014, CT 12/04/2014, chest radiograph 12/02/2014  FINDINGS: 3 RIGHT chest tubes  noted. There is interval decrease in volume of the pleural fluid in the RIGHT hemi thorax. No appreciable pneumothorax identified. RIGHT central venous line is in place with tip in the distal SVC. Endotracheal tube is 3.8 cm from carina. LEFT lung is clear.  IMPRESSION: 1. Decrease in pleural fluid in the RIGHT hemi thorax. 2. Three right chest tubes in place without pneumothorax. 3. Endotracheal tube and central venous line appear in good position.   Electronically Signed   By: Suzy Bouchard M.D.   On: 12/05/2014 14:09   Dg Abd Portable 1v  12/11/2014   CLINICAL DATA:  Nasogastric tube placement.  Initial encounter.  EXAM: PORTABLE ABDOMEN - 1 VIEW  COMPARISON:  Abdominal radiograph performed  12/09/2014, and chest radiograph performed earlier today at 7:10 a.m.  FINDINGS: The patient's enteric tube is noted ending overlying the third segment of the duodenum, at appropriate position.  The visualized bowel gas pattern is grossly unremarkable, with scattered air filled loops of small and large bowel seen.  A small right pleural effusion is noted, with right basilar airspace opacification. No free intra-abdominal air is identified, though evaluation for free air is limited on a single supine view.  Mild degenerative change is noted at the lower lumbar spine.  IMPRESSION: 1. Enteric tube noted ending overlying the third segment of the duodenum, at appropriate position. 2. Small right pleural effusion, with right basilar airspace opacification, mildly more prominent than on the prior study.   Electronically Signed   By: Garald Balding M.D.   On: 12/11/2014 02:01   Dg Abd Portable 1v  12/09/2014   CLINICAL DATA:  Feeding tube placement .  EXAM: PORTABLE ABDOMEN - 1 VIEW  COMPARISON:  None.  FINDINGS: Feeding tube noted with its tip projected over the descending portion of the duodenum. No gastric distention. Degenerative changes lumbar spine. Vascular calcification.  IMPRESSION: Feeding tube noted with tip projected over the descending portion of the duodenum. No gastric distention noted.   Electronically Signed   By: Udall   On: 12/09/2014 13:09   Dg Swallowing Func-speech Pathology  12/04/2014    Objective Swallowing Evaluation:    Patient Details  Name: Logan May MRN: [id number] Date of Birth: August 31, 1945  Today's Date: 12/04/2014 Time: SLP Start Time (ACUTE ONLY): 1235-SLP Stop Time (ACUTE ONLY): 1258 SLP Time Calculation (min) (ACUTE ONLY): 23 min  Past Medical History:  Past Medical History  Diagnosis Date  . BPH (benign prostatic hypertrophy)   . Prostate cancer     implants   . Vitamin D deficiency     takes Vit D daily  . History of ETT 1999    Dr, Caleen Essex  . Anxiety     takes  Valium daily as needed  . Hyperlipidemia     takes Simvastatin nightly  . Hypertension     takes Quinapril and Procardia daily  . Headache(784.0)     occasionally  . Peripheral neuropathy   . Diabetes mellitus, type 2     but doesn't take any meds for it;diet controlled and exercise  . 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 Pneumonia 1960's?  . CAP (community acquired pneumonia) 12/02/2014   Past Surgical History:  Past Surgical History  Procedure Laterality Date  . Foot surgery  May 2002    Dr. Sharol Given   . Left foot casted 4-5 months    . Bilateral  eye laser surgery Bilateral 2000    "related to diabetes"  . Charcot  foot left Left 12/15/01  . Laser eye surgery right  12/2002  . Eye surgery    . Tonsillectomy    . I&d extremity Left 09/05/2012    Procedure: IRRIGATION AND DEBRIDEMENT EXTREMITY;  Surgeon: Newt Minion, MD;  Location: [facility];  Service: Orthopedics;  Laterality: Left;   Excision Charcot Collapse Left Foot and Base 5th Metatarsal, Antibiotic  Beads  . Esophagogastroduodenoscopy      at least 52yrs ago  . 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 male with PMH of Diabetes  type 2 with Charcot foot supposedly diet-controlled, hypertension,  generalized anxiety disorder, history of atrial fibrillation not on  anticoagulation presumably secondary to falls. Patient presents to the  hospital with 3 weeks of worsening productive cough with purulent sputum.  Guaifenesin has been helpful, but has not resolved his cough. He also has  been having increased weakness with some mild dyspnea on exertion which is  improved with rest. Also noted concerns of very dry mouth and poor oral  hygiene. CXR 7/28 revealed diffuse opacification of RLL consistent with  PNA with associated pleural effusion. Bedside swallow eval ordered to aide  in  ruling out aspiration.   No Data Recorded  Assessment / Plan / Recommendation CHL IP CLINICAL IMPRESSIONS 12/04/2014  Therapy Diagnosis Mild oral phase dysphagia;Moderate pharyngeal phase  dysphagia;Mild cervical esophageal phase dysphagia  Clinical Impression Pt currently demonstrating a mild oral/ moderate  pharyngeal dysphagia that appears sensorimotor in nature. Delay in swallow  initiation accompanied by reduced airway protection (reduced base of  tongue retraction and minimal epiglottic inversion) resulted in  penetration during the swallow x1 of thin liquids. Reduced epiglottic  inversion/ base of tongue retraction also resulted in moderate-severe  amounts of vallecular residuals across consistencies which led to  penetration post-swallow x1 of thin liquids- pt had a weak throat clear  response and cleared remaining penetrates with cued cough. Pt is not able  to control sip size and large sip of nectar thick liquids resulted in  significant residuals in vallecula, about half of which were cleared with  multiple swallows and cough/ throat clears (cued- pt did not sense).  Nectar thick liquids by teaspoon accompanied by chin tuck reduced  residuals and controlled sip size. Minimal backflow to cervical esophagus  observed across consistencies. Recommend initiating dysphagia 2 diet,  nectar thick liquids by teaspoon, meds crushed in puree, full supervision  to cue pt to tuck chin, swallow 2x with chin down. Also have pt sit  upright 30 minutes after meal. Pt was able to follow instructions for  strategies during MBS with a visual cue- hopeful that this will continue  at bedside as well but decreased cognitive status continues to put pt at  increased risk of aspiration. SLP will continue to follow closely for diet  tolerance/ consider advancement or repeat MBS as cognitive status  improves.      CHL IP TREATMENT RECOMMENDATION 12/04/2014  Treatment Recommendations Therapy as outlined in treatment plan below     CHL  IP DIET RECOMMENDATION 12/04/2014  SLP Diet Recommendations Dysphagia 2 (Fine chop);Nectar  Liquid Administration via (None)  Medication Administration Crushed with puree  Compensations Slow rate;Small sips/bites;Multiple dry swallows after each  bite/sip;Clear throat intermittently;Chin tuck  Postural Changes  and/or Swallow Maneuvers (None)     CHL IP OTHER RECOMMENDATIONS 12/04/2014  Recommended Consults (None)  Oral Care Recommendations Oral care BID  Other Recommendations Order thickener from pharmacy;Clarify dietary  restrictions     No flowsheet data found.   CHL IP FREQUENCY AND DURATION 12/04/2014  Speech Therapy Frequency (ACUTE ONLY) min 2x/week  Treatment Duration 2 weeks     Pertinent Vitals/Pain none    SLP Swallow Goals No flowsheet data found.  No flowsheet data found.    CHL IP REASON FOR REFERRAL 12/04/2014  Reason for Referral Objectively evaluate swallowing function     CHL IP ORAL PHASE 12/04/2014  Lips (None)  Tongue (None)  Mucous membranes (None)  Nutritional status (None)  Other (None)  Oxygen therapy (None)  Oral Phase Impaired  Oral - Pudding Teaspoon (None)  Oral - Pudding Cup (None)  Oral - Honey Teaspoon (None)  Oral - Honey Cup (None)  Oral - Honey Syringe (None)  Oral - Nectar Teaspoon (None)  Oral - Nectar Cup (None)  Oral - Nectar Straw (None)  Oral - Nectar Syringe (None)  Oral - Ice Chips (None)  Oral - Thin Teaspoon (None)  Oral - Thin Cup (None)  Oral - Thin Straw (None)  Oral - Thin Syringe (None)  Oral - Puree (None)  Oral - Mechanical Soft (None)  Oral - Regular (None)  Oral - Multi-consistency (None)  Oral - Pill (None)  Oral Phase - Comment (None)      CHL IP PHARYNGEAL PHASE 12/04/2014  Pharyngeal Phase Impaired  Pharyngeal - Pudding Teaspoon (None)  Penetration/Aspiration details (pudding teaspoon) (None)  Pharyngeal - Pudding Cup (None)  Penetration/Aspiration details (pudding cup) (None)  Pharyngeal - Honey Teaspoon (None)  Penetration/Aspiration details (honey teaspoon)  (None)  Pharyngeal - Honey Cup (None)  Penetration/Aspiration details (honey cup) (None)  Pharyngeal - Honey Syringe (None)  Penetration/Aspiration details (honey syringe) (None)  Pharyngeal - Nectar Teaspoon (None)  Penetration/Aspiration details (nectar teaspoon) (None)  Pharyngeal - Nectar Cup (None)  Penetration/Aspiration details (nectar cup) (None)  Pharyngeal - Nectar Straw (None)  Penetration/Aspiration details (nectar straw) (None)  Pharyngeal - Nectar Syringe (None)  Penetration/Aspiration details (nectar syringe) (None)  Pharyngeal - Ice Chips (None)  Penetration/Aspiration details (ice chips) (None)  Pharyngeal - Thin Teaspoon (None)  Penetration/Aspiration details (thin teaspoon) (None)  Pharyngeal - Thin Cup (None)  Penetration/Aspiration details (thin cup) (None)  Pharyngeal - Thin Straw (None)  Penetration/Aspiration details (thin straw) (None)  Pharyngeal - Thin Syringe (None)  Penetration/Aspiration details (thin syringe') (None)  Pharyngeal - Puree (None)  Penetration/Aspiration details (puree) (None)  Pharyngeal - Mechanical Soft (None)  Penetration/Aspiration details (mechanical soft) (None)  Pharyngeal - Regular (None)  Penetration/Aspiration details (regular) (None)  Pharyngeal - Multi-consistency (None)  Penetration/Aspiration details (multi-consistency) (None)  Pharyngeal - Pill (None)  Penetration/Aspiration details (pill) (None)  Pharyngeal Comment (None)      CHL IP CERVICAL ESOPHAGEAL PHASE 12/04/2014  Cervical Esophageal Phase Impaired  Pudding Teaspoon (None)  Pudding Cup (None)  Honey Teaspoon (None)  Honey Cup (None)  Honey Straw (None)  Nectar Teaspoon Reduced cricopharyngeal relaxation;Esophageal backflow  into cervical esophagus  Nectar Cup Reduced cricopharyngeal relaxation;Esophageal backflow into  cervical esophagus  Nectar Straw (None)  Nectar Sippy Cup (None)  Thin Teaspoon (None)  Thin Cup Reduced cricopharyngeal relaxation;Esophageal backflow into  cervical esophagus  Thin  Straw (None)  Thin Sippy Cup (None)  Cervical Esophageal Comment (None)    No flowsheet data found.  Kern Reap, MA, CCC-SLP 12/04/2014, 1:55 PM [phone]    Micro Results      No results found for this or any previous visit (from the past 240 hour(s)).     Today   Subjective    Logan May today has no headache,no chest abdominal pain,no new weakness tingling or numbness.   Objective   Blood pressure 152/84, pulse 118, temperature 97.7 F (36.5 C), temperature source Oral, resp. rate 30, height 5\' 11"  (1.803 m), weight 81.7 kg (180 lb 1.9 oz), SpO2 90 %.   Intake/Output Summary (Last 24 hours) at 12/19/14 0952 Last data filed at 12/19/14 0659  Gross per 24 hour  Intake      0 ml  Output   2000 ml  Net  -2000 ml    Exam Awake Alert, Oriented x 3, No new F.N deficits, Normal affect [place].AT,PERRAL Supple Neck,No JVD, No cervical lymphadenopathy appriciated.  Symmetrical Chest wall movement, Good air movement bilaterally, CTAB RRR,No Gallops,Rubs or new Murmurs, No Parasternal Heave +ve B.Sounds, Abd Soft, Non tender, No organomegaly appriciated, No rebound -guarding or rigidity. No Cyanosis, Clubbing or edema, No new Rash or bruise   Data Review   CBC w Diff:  Lab Results  Component Value Date   WBC 8.3 12/17/2014   WBC 7.9 05/26/2014   WBC 9.1 12/02/2013   HGB 7.7* 12/17/2014   HGB 14.4 12/02/2013   HCT 24.6* 12/17/2014   HCT 44.0 12/02/2013   PLT 378 12/17/2014   LYMPHOPCT 8* 12/05/2014   MONOPCT 5 12/05/2014   EOSPCT 0 12/05/2014   BASOPCT 0 12/05/2014    CMP:  Lab Results  Component Value Date   NA 138 12/17/2014   NA 140 05/26/2014   K 3.3* 12/17/2014   CL 99* 12/17/2014   CO2 32 12/17/2014   BUN 5* 12/17/2014   BUN 12 05/26/2014   CREATININE 0.90 12/17/2014   CREATININE 0.97 11/26/2012   PROT 5.4* 12/17/2014   PROT 7.1 05/26/2014   ALBUMIN 1.6* 12/17/2014   BILITOT 0.6 12/17/2014   ALKPHOS 62 12/17/2014   AST 20 12/17/2014    ALT 10* 12/17/2014  .   Total Time in preparing paper work, data evaluation and todays exam - 35 minutes  Thurnell Lose M.D on 12/19/2014 at 9:52 AM  [facility]   Office  [phone]

## 2014-12-17 NOTE — Progress Notes (Signed)
[facility] Progress Note  12 Days Post-Op  Subjective: Pt confused.  Says "home sweet home".  Family at bedside having conversation with palliative.    Objective: Vital signs in last 24 hours: Temp:  [97.5 F (36.4 C)-98.7 F (37.1 C)] 98.7 F (37.1 C) (08/12 0454) Pulse Rate:  [99-110] 110 (08/12 0454) Resp:  [18-23] 23 (08/12 0454) BP: (124-142)/(60-72) 142/72 mmHg (08/12 0454) SpO2:  [97 %-98 %] 98 % (08/12 0913) Weight:  [81.5 kg (179 lb 10.8 oz)] 81.5 kg (179 lb 10.8 oz) (08/12 0450) Last BM Date: 12/16/14  Intake/Output from previous day: 08/11 0701 - 08/12 0700 In: 1171 [I.V.:1171] Out: 928 [Urine:925; Stool:3] Intake/Output this shift:    PE: Gen:  Alert, NAD, pleasant Card:  RRR, no M/G/R heard Pulm:  B/L course breath sounds and very wet sounding, coughing constantly Abd: Soft, NT/ND, +BS, no HSM, no abdominal scars noted   Lab Results:   Recent Labs  12/16/14 0420 12/17/14 0400  WBC 9.3 8.3  HGB 7.6* 7.7*  HCT 24.4* 24.6*  PLT 378 378   BMET  Recent Labs  12/16/14 0420 12/17/14 0400  NA 141 138  K 3.3* 3.3*  CL 101 99*  CO2 30 32  GLUCOSE 121* 150*  BUN 6 5*  CREATININE 0.99 0.90  CALCIUM 7.4* 7.3*   PT/INR  Recent Labs  12/17/14 0400  LABPROT 15.6*  INR 1.22   CMP     Component Value Date/Time   NA 138 12/17/2014 0400   NA 140 05/26/2014 0923   K 3.3* 12/17/2014 0400   CL 99* 12/17/2014 0400   CO2 32 12/17/2014 0400   GLUCOSE 150* 12/17/2014 0400   GLUCOSE 172* 05/26/2014 0923   BUN 5* 12/17/2014 0400   BUN 12 05/26/2014 0923   CREATININE 0.90 12/17/2014 0400   CREATININE 0.97 11/26/2012 1131   CALCIUM 7.3* 12/17/2014 0400   PROT 5.4* 12/17/2014 0400   PROT 7.1 05/26/2014 0923   ALBUMIN 1.6* 12/17/2014 0400   AST 20 12/17/2014 0400   ALT 10* 12/17/2014 0400   ALKPHOS 62 12/17/2014 0400   BILITOT 0.6 12/17/2014 0400   GFRNONAA >60 12/17/2014 0400   GFRNONAA 80 11/26/2012 1131   GFRAA >60 12/17/2014  0400   GFRAA >89 11/26/2012 1131   Lipase  No results found for: LIPASE     Studies/Results: Ct Abdomen Wo Contrast  12/16/2014   CLINICAL DATA:  Evaluate anatomy for possible gastrostomy tube placement. History of dysphagia.  EXAM: CT ABDOMEN WITHOUT CONTRAST  TECHNIQUE: Multidetector CT imaging of the abdomen was performed following the standard protocol without IV contrast.  COMPARISON:  12/12/2014  FINDINGS: There are bilateral pleural effusions, left side greater the lack. There is volume loss in the left lower lobe with consolidation and volume loss in the right lower lobe. Evidence for coronary artery calcifications.  There is no significant hiatal hernia. The transverse colon is anterior to the stomach and liver in the upper abdomen. Few small bowel loops in the left upper abdomen. No significant dilatation of small or large bowel.  No gross abnormality to the liver. There are calcifications in the gallbladder, compatible with cholelithiasis. No significant gallbladder distention. Normal appearance of the spleen. Atherosclerotic calcifications in the splenic artery. Normal appearance of the adrenal glands. Mild perinephric stranding bilaterally which may be chronic. No significant hydronephrosis or kidney stones. Atherosclerotic calcifications in the aorta and visceral arteries without aneurysm. No significant lymphadenopathy in the upper abdomen.  There is severe  disc space narrowing and endplate disease in the lumbar spine involving L2-L5. Complete loss of the disc space at L3-L4.  IMPRESSION: The transverse colon is located in the anterior upper abdomen. The location of the transverse colon may be problematic for placement of a percutaneous gastrostomy tube.  Bilateral pleural effusions with consolidation in the right lower lobe. Cannot exclude an infectious or inflammatory process in the right lower lobe.  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   Electronically Signed   By: Markus Daft M.D.    On: 12/16/2014 15:28   Ct Head Wo Contrast  12/15/2014   CLINICAL DATA:  Delirium secondary to alcohol withdrawal  EXAM: CT HEAD WITHOUT CONTRAST  TECHNIQUE: Contiguous axial images were obtained from the base of the skull through the vertex without intravenous contrast.  COMPARISON:  None.  FINDINGS: There is no evidence of mass effect, midline shift, or extra-axial fluid collections. There is no evidence of a space-occupying lesion or intracranial hemorrhage. There is no evidence of a cortical-based area of acute infarction. There is generalized cerebral atrophy. There is periventricular white matter low attenuation likely secondary to microangiopathy.  The ventricles and sulci are appropriate for the patient's age. The basal cisterns are patent.  Visualized portions of the orbits are unremarkable. The visualized portions of the paranasal sinuses and mastoid air cells are unremarkable. Cerebrovascular atherosclerotic calcifications are noted.  The osseous structures are unremarkable.  IMPRESSION: No acute intracranial pathology.   Electronically Signed   By: Kathreen Devoid   On: 12/15/2014 16:05   Dg Chest Port 1 View  12/16/2014   CLINICAL DATA:  Pneumonia.  EXAM: PORTABLE CHEST - 1 VIEW  COMPARISON:  12/14/2014 .  FINDINGS: Interim removal of NG tube. Right IJ line stable position. Stable cardiomegaly. Persistent but partially clearing bilateral pulmonary infiltrates and bilateral pleural effusions. No pneumothorax .  IMPRESSION: 1. Interim removal of NG tube.  Right IJ line in stable position. 2. Persistent but partially clearing bilateral pulmonary infiltrates and bilateral pleural effusions .   Electronically Signed   By: Marcello Moores  Register   On: 12/16/2014 08:00    Anti-infectives: Anti-infectives    Start     Dose/Rate Route Frequency Ordered Stop   12/12/14 1600  cefTRIAXone (ROCEPHIN) 1 g in dextrose 5 % 50 mL IVPB     1 g 100 mL/hr over 30 Minutes Intravenous Every 24 hours 12/12/14 1451      12/12/14 1200  levofloxacin (LEVAQUIN) IVPB 750 mg  Status:  Discontinued     750 mg 100 mL/hr over 90 Minutes Intravenous Every 24 hours 12/12/14 1042 12/12/14 1451   12/10/14 2100  piperacillin-tazobactam (ZOSYN) IVPB 3.375 g  Status:  Discontinued     3.375 g 12.5 mL/hr over 240 Minutes Intravenous Every 8 hours 12/10/14 1432 12/12/14 1035   12/05/14 0900  vancomycin (VANCOCIN) IVPB 1000 mg/200 mL premix  Status:  Discontinued     1,000 mg 200 mL/hr over 60 Minutes Intravenous Every 12 hours 12/04/14 2003 12/09/14 0736   12/05/14 0600  vancomycin (VANCOCIN) IVPB 1000 mg/200 mL premix  Status:  Discontinued     1,000 mg 200 mL/hr over 60 Minutes Intravenous To Surgery 12/04/14 1604 12/04/14 2000   12/04/14 2100  piperacillin-tazobactam (ZOSYN) IVPB 3.375 g  Status:  Discontinued     3.375 g 12.5 mL/hr over 240 Minutes Intravenous Every 8 hours 12/04/14 2003 12/10/14 1432   12/04/14 2100  vancomycin (VANCOCIN) 1,500 mg in sodium chloride 0.9 % 500  mL IVPB     1,500 mg 250 mL/hr over 120 Minutes Intravenous  Once 12/04/14 2003 12/04/14 2348   12/03/14 1330  levofloxacin (LEVAQUIN) IVPB 750 mg  Status:  Discontinued     750 mg 100 mL/hr over 90 Minutes Intravenous Every 24 hours 12/02/14 1514 12/04/14 1951   12/02/14 1130  cefTRIAXone (ROCEPHIN) 1 g in dextrose 5 % 50 mL IVPB     1 g 100 mL/hr over 30 Minutes Intravenous  Once 12/02/14 1118 12/02/14 1246   12/02/14 1130  azithromycin (ZITHROMAX) 500 mg in dextrose 5 % 250 mL IVPB     500 mg 250 mL/hr over 60 Minutes Intravenous  Once 12/02/14 1118 12/02/14 1429       Assessment/Plan Chronic dysphagia/aspiration risk -Family still unsure about G-tube, the risks/benefits have been explained.  This am they were agreeable to it, but now they seem to be leaning towards no G-tube since higher risk surgery and may not help him control his thick secretions from aspiration.  Palliative having that discussion.  Call us back as needed.  Will  hold off for now, if they change their mind could potentially perform tomorrow.    LOS: 15 days    Logan May 12/17/2014, 9:52 AM Pager: [phone]

## 2014-12-17 NOTE — Progress Notes (Signed)
Nutrition Brief Note  Chart reviewed. Pt now transitioning to comfort care.  No further nutrition interventions warranted at this time.  Please re-consult as needed.   Oral Remache A. Viral Schramm, RD, LDN, CDE Pager: [phone] After hours Pager: [phone]  

## 2014-12-17 NOTE — Progress Notes (Signed)
PATIENT DETAILS Name: Logan May Age: 69 y.o. 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 PCCM was consulted, underwent thoracocentesis on 7/30-pleural fluid was consistent with an empyema. Subsequently cardiothoracic surgery was consulted, and a right sided VATS, drainage of empyema and visceral/pleural decortication was done on 7/31. Patient subsequently developed respiratory failure and required mechanical ventilation till 8/3. ICU course was complicated by development of atrial fibrillation with RVR requiring amiodarone infusion and severe delirium thought to be secondary to alcohol withdrawal. Upon clinical stability, patient was transferred to the hospitalist service on 8/6.  Subjective:  Is confused but denies any headache, no chest or abdominal pain. Is following basic commands. Denies any focal weakness.  Assessment/Plan:  Severe sepsis from right-sided pneumonia and empyema likely due to underlying micro-aspiration: With acute hypoxic respiratory failure requiring intubation.  Sepsis pathophysiology has resolved following VATS/decortication. He required brief intubation and ICU stay was extubated on 12/07/2014. Blood culture neg, but tissue culture positive for microaerophilic streptococcus will taper down to Rocephin at this point.  He continues to exhibit signs of aspiration, despite NG tube feeding patient is clinically aspirating and I suspect he is even aspirating his oral secretions, therapy on board, I discontinued NG  tube on 12/14/2014, Had added anti-suction and pulmonary toiletry, he shows some clinical improvement since NG tube was discontinued and pulm toiletry was initiated.  He has failed 3 evaluations by speech therapist from 8/9 to 12/16/2014, after detailed discussions with wife, pulmonary physician Dr. Marianna Payment at [facility], speech therapist and input by Dr. Roxan Hockey cardiothoracic surgery. Wife has now agreed for a PEG tube placement. INR has been requested to evaluate and place a GJ tube.  INR was consulted for PEG tube placement the recommended surgery input for open GJ tube, general surgery, palliative care and me all talk to patient-wife and patient's sister in detail. They all wish to pursue only comfort feeds and not to continue any further due placement of artificial feeding. They understand that he will continue to aspirate with comfort feeds and will likely die soon from the same. He will be DO NOT RESUSCITATE now.   Addendum wife requested patient be transferred to [facility], discussed the case with [facility] critical care physician Dr. Marianna Payment. On 12/15/2014 1:50 PM. Who agrees that besides feeding tube versus comfort measures he has nothing to offer and agrees with management at this facility.    Right sided Empyema I suspect from long-standing microaspiration: underwent VATS, drainage of empyema and visceral/pleural decortication on 7/31.Tissue culture 7/31 positive for Microaerophilic streptococci,  AFB culture/fungal culture negative so far. ABX adjusted based on cultures - Abx stop date 8/13   Severe Dysphagia: Suspect long-term microaspiration, speech on board, despite having an NG tube patient continued to have clinical evidence of ongoing aspiration of oral secretions and GI content regurgitation. NG tube was removed on 12/14/2014. Shows improvement on 12/15/2014. Has failed 3 evaluations by speech therapy, now due for PEG tube placement.   Acute Urinary Retention:underwent Flexible cytoscopy and  passage of foley catheter by  Dr Gaynelle Arabian on 7/31   Afib XNA:[id number] Amiodarone gtt for rate control, now on prn lopressor. Felt to be a poor candidate for long term anticoagulation-although has a CHADS2Vasc SCORE of  3. Stared on ASA   ETOH withdrawal with delirium and metabolic encephalopathy:required Precedex gtt while in [place] less agitated-still somewhat confused-continue Ativan per CIWA protocol. Counseled to quit alcohol.   Enterococcal UTI: Seen on urine culture 7/29-this is pansensitive. Has been treated with Zosyn.   Left upper Ext swelling: stable Doppler   Anemia: likely secondary to  critical illness. Follow CBC and transfuse prn if HB<7   Hx of COPD with emphysema:continue bronchodilators-lungs clear   Dyslipidemia:continue Statin   Severe Deconditioning:PT on board-SNF on discharge   Type 2 DM: CBG's stable with SSI. A1C 6.8 on 7/29  CBG (last 3)   Recent Labs  12/17/14 0006 12/17/14 0441 12/17/14 0818  GLUCAP 141* 156* 143*     Hypokalemia and hypomagnesemia. Both replaced.     Disposition: Remain inpatient-SNF on discharge   Antimicrobial agents  See below  Anti-infectives    Start     Dose/Rate Route Frequency Ordered Stop   12/12/14 1600  cefTRIAXone (ROCEPHIN) 1 g in dextrose 5 % 50 mL IVPB     1 g 100 mL/hr over 30 Minutes Intravenous Every 24 hours 12/12/14 1451     12/12/14 1200  levofloxacin (LEVAQUIN) IVPB 750 mg  Status:  Discontinued     750 mg 100 mL/hr over 90 Minutes Intravenous Every 24 hours 12/12/14 1042 12/12/14 1451   12/10/14 2100  piperacillin-tazobactam (ZOSYN) IVPB 3.375 g  Status:  Discontinued     3.375 g 12.5 mL/hr over 240 Minutes Intravenous Every 8 hours 12/10/14 1432 12/12/14 1035   12/05/14 0900  vancomycin (VANCOCIN) IVPB 1000 mg/200 mL premix  Status:  Discontinued     1,000 mg 200 mL/hr over 60 Minutes Intravenous Every 12 hours 12/04/14 2003 12/09/14 0736   12/05/14 0600  vancomycin  (VANCOCIN) IVPB 1000 mg/200 mL premix  Status:  Discontinued     1,000 mg 200 mL/hr over 60 Minutes Intravenous To Surgery 12/04/14 1604 12/04/14 2000   12/04/14 2100  piperacillin-tazobactam (ZOSYN) IVPB 3.375 g  Status:  Discontinued     3.375 g 12.5 mL/hr over 240 Minutes Intravenous Every 8 hours 12/04/14 2003 12/10/14 1432   12/04/14 2100  vancomycin (VANCOCIN) 1,500 mg in sodium chloride 0.9 % 500 mL IVPB     1,500 mg 250 mL/hr over 120 Minutes Intravenous  Once 12/04/14 2003 12/04/14 2348   12/03/14 1330  levofloxacin (LEVAQUIN) IVPB 750 mg  Status:  Discontinued     750 mg 100 mL/hr over 90 Minutes Intravenous Every 24 hours 12/02/14 1514 12/04/14 1951   12/02/14 1130  cefTRIAXone (ROCEPHIN) 1 g in dextrose 5 % 50 mL IVPB     1 g 100 mL/hr over 30 Minutes Intravenous  Once 12/02/14 1118 12/02/14 1246   12/02/14 1130  azithromycin (ZITHROMAX) 500 mg in dextrose 5 % 250 mL IVPB     500 mg 250 mL/hr over 60 Minutes Intravenous  Once 12/02/14 1118 12/02/14 1429      DVT Prophylaxis: Prophylactic Lovenox   Code Status: Full code   Family Communication Wife bedside 12-12-14  Procedures: 7/30>>Thoracocentesis 7/31>>VATS, drainage of empyema and visceral/pleural decortication(Dr Hendrickson) 7/31>> Flexible cytoscopy and passage of foley catheter (Dr Gaynelle Arabian) 7/31>>8/3 ETT, R.IJ C line 12/12/2014 bilateral upper extremity venous duplex - no DVT SVT  CONSULTS:   pulmonary/intensive  care, urology and CTVS, Pall Care  Time spent  40 minutes-Greater than 50% of this time was spent in counseling, explanation of diagnosis, planning of further management, and coordination of care.  MEDICATIONS: Scheduled Meds: . aspirin  150 mg Rectal Daily  . atorvastatin  20 mg Oral q1800  . cefTRIAXone (ROCEPHIN)  IV  1 g Intravenous Q24H  . cholecalciferol  2,000 Units Oral Once per day on Mon Tue Wed Thu Fri  . cholecalciferol  4,000 Units Oral Once per day on Sun Sat  . [START ON  12/18/2014] enoxaparin (LOVENOX) injection  40 mg Subcutaneous Q24H  . folic acid  1 mg Intravenous Daily  . insulin aspart  0-15 Units Subcutaneous 6 times per day  . ipratropium-albuterol  3 mL Nebulization TID  . metoprolol tartrate  25 mg Oral BID  . potassium chloride  10 mEq Intravenous Q1 Hr x 6  . scopolamine  1 patch Transdermal Q72H  . senna-docusate  1 tablet Oral QHS  . thiamine IV  100 mg Intravenous Daily   Continuous Infusions: . dextrose 5 % and 0.45% NaCl 1,000 mL (12/17/14 0832)   PRN Meds:.acetaminophen, albuterol, fentaNYL (SUBLIMAZE) injection, Gerhardt's butt cream, ipratropium-albuterol, LORazepam, metoprolol, ondansetron (ZOFRAN) IV, sodium chloride    PHYSICAL EXAM: Vital signs in last 24 hours: Filed Vitals:   12/16/14 2151 12/17/14 0450 12/17/14 0454 12/17/14 0913  BP: 124/63  142/72   Pulse: 107  110   Temp: 97.5 F (36.4 C)  98.7 F (37.1 C)   TempSrc: Oral  Oral   Resp: 20  23   Height:      Weight:  81.5 kg (179 lb 10.8 oz)    SpO2: 97%  97% 98%    Weight change: -0.9 kg (-1 lb 15.7 oz) Filed Weights   12/15/14 0653 12/16/14 0700 12/17/14 0450  Weight: 85 kg (187 lb 6.3 oz) 82.4 kg (181 lb 10.5 oz) 81.5 kg (179 lb 10.8 oz)   Body mass index is 25.07 kg/(m^2).   Gen Exam: Awake but confused-however follows some commands. Panda tube in place Neck: Supple, No JVD.  Chest: B/L Clear anteriorly CVS: S1 S2 Regular, no murmurs.  Abdomen: soft, BS +, non tender Extremities: no edema, lower extremities warm to touch. Neurologic: Non Focal-but with gen weakness  Intake/Output from previous day:  Intake/Output Summary (Last 24 hours) at 12/17/14 1154 Last data filed at 12/17/14 0500  Gross per 24 hour  Intake   1171 ml  Output    927 ml  Net    244 ml     LAB RESULTS: CBC  Recent Labs Lab 12/11/14 0515 12/13/14 0458 12/14/14 0520 12/16/14 0420 12/17/14 0400  WBC 9.9 11.2* 10.9* 9.3 8.3  HGB 8.4* 8.1* 8.0* 7.6* 7.7*  HCT 26.5*  25.8* 25.5* 24.4* 24.6*  PLT 429* 428* 405* 378 378  MCV 89.2 89.9 89.8 91.7 90.4  MCH 28.3 28.2 28.2 28.6 28.3  MCHC 31.7 31.4 31.4 31.1 31.3  RDW 15.7* 15.9* 16.0* 15.7* 15.6*    Chemistries   Recent Labs Lab 12/13/14 0458 12/14/14 0520 12/15/14 0612 12/16/14 0420 12/17/14 0400  NA 141 141 142 141 138  K 3.3* 3.3* 3.4* 3.3* 3.3*  CL 109 106 105 101 99*  CO2 26 27 29 30  32  GLUCOSE 203* 206* 106* 121* 150*  BUN 8 10 7 6  5*  CREATININE 0.93 1.02 0.94 0.99 0.90  CALCIUM 7.3* 7.5* 7.4* 7.4* 7.3*  MG 1.4* 1.6* 1.7 1.7 1.5*  CBG:  Recent Labs Lab 12/16/14 1712 12/16/14 2028 12/17/14 0006 12/17/14 0441 12/17/14 0818  GLUCAP 151* 115* 141* 156* 143*    GFR Estimated Creatinine Clearance: 81.3 mL/min (by C-G formula based on Cr of 0.9).  Coagulation profile  Recent Labs Lab 12/17/14 0400  INR 1.22    Cardiac Enzymes No results for input(s): CKMB, TROPONINI, MYOGLOBIN in the last 168 hours.  Invalid input(s): CK  Invalid input(s): POCBNP No results for input(s): DDIMER in the last 72 hours. No results for input(s): HGBA1C in the last 72 hours. No results for input(s): CHOL, HDL, LDLCALC, TRIG, CHOLHDL, LDLDIRECT in the last 72 hours. No results for input(s): TSH, T4TOTAL, T3FREE, THYROIDAB in the last 72 hours.  Invalid input(s): FREET3 No results for input(s): VITAMINB12, FOLATE, FERRITIN, TIBC, IRON, RETICCTPCT in the last 72 hours. No results for input(s): LIPASE, AMYLASE in the last 72 hours.  Urine Studies No results for input(s): UHGB, CRYS in the last 72 hours.  Invalid input(s): UACOL, UAPR, USPG, UPH, UTP, UGL, UKET, UBIL, UNIT, UROB, ULEU, UEPI, UWBC, URBC, UBAC, CAST, UCOM, BILUA  MICROBIOLOGY: No results found for this or any previous visit (from the past 240 hour(s)).  RADIOLOGY STUDIES/RESULTS: Ct Abdomen Wo Contrast  12/16/2014   CLINICAL DATA:  Evaluate anatomy for possible gastrostomy tube placement. History of dysphagia.  EXAM:  CT ABDOMEN WITHOUT CONTRAST  TECHNIQUE: Multidetector CT imaging of the abdomen was performed following the standard protocol without IV contrast.  COMPARISON:  12/12/2014  FINDINGS: There are bilateral pleural effusions, left side greater the lack. There is volume loss in the left lower lobe with consolidation and volume loss in the right lower lobe. Evidence for coronary artery calcifications.  There is no significant hiatal hernia. The transverse colon is anterior to the stomach and liver in the upper abdomen. Few small bowel loops in the left upper abdomen. No significant dilatation of small or large bowel.  No gross abnormality to the liver. There are calcifications in the gallbladder, compatible with cholelithiasis. No significant gallbladder distention. Normal appearance of the spleen. Atherosclerotic calcifications in the splenic artery. Normal appearance of the adrenal glands. Mild perinephric stranding bilaterally which may be chronic. No significant hydronephrosis or kidney stones. Atherosclerotic calcifications in the aorta and visceral arteries without aneurysm. No significant lymphadenopathy in the upper abdomen.  There is severe disc space narrowing and endplate disease in the lumbar spine involving L2-L5. Complete loss of the disc space at L3-L4.  IMPRESSION: The transverse colon is located in the anterior upper abdomen. The location of the transverse colon may be problematic for placement of a percutaneous gastrostomy tube.  Bilateral pleural effusions with consolidation in the right lower lobe. Cannot exclude an infectious or inflammatory process in the right lower lobe.  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   Electronically Signed   By: Markus Daft M.D.   On: 12/16/2014 15:28   Dg Chest 1 View  12/04/2014   CLINICAL DATA:  Post right thoracentesis  EXAM: CHEST  1 VIEW  COMPARISON:  12/02/2014  FINDINGS: Diffuse right lung airspace disease again noted, slightly improved. Decreasing right  effusion following thoracentesis. No pneumothorax. Heart is borderline in size. Nodular density projecting over the left lung base is felt represent nipple shadow. Left lung is clear. No acute bony abnormality.  IMPRESSION: Decreasing right effusion and diffuse right lung airspace disease. No pneumothorax.   Electronically Signed   By: Rolm Baptise M.D.   On: 12/04/2014 13:10  Dg Chest 2 View  12/02/2014   CLINICAL DATA:  Altered level of consciousness.  EXAM: CHEST  2 VIEW  COMPARISON:  Sep 04, 2012.  FINDINGS: The heart size and mediastinal contours are within normal limits. No pneumothorax is noted. Left lung is clear. There is now noted diffuse opacification of the right lower lobe most consistent with pneumonia with associated pleural effusion. The visualized skeletal structures are unremarkable.  IMPRESSION: Diffuse opacification of the right lower lobe most consistent with pneumonia with associated pleural effusion. Follow-up radiographs are recommended.   Electronically Signed   By: Marijo Conception, M.D.   On: 12/02/2014 10:57   Dg Abd 1 View  12/12/2014   CLINICAL DATA:  Feeding tube placement  EXAM: ABDOMEN - 1 VIEW  COMPARISON:  12/10/2014  FINDINGS: Tip of feeding tube projects over proximal stomach.  Air-filled upper normal caliber loops of small bowel.  Scattered colonic gas.  No evidence of bowel obstruction or dilatation.  Scattered degenerative disc disease changes lumbar spine.  Foley catheter in urinary bladder.  IMPRESSION: Tip of feeding tube projects over proximal stomach.   Electronically Signed   By: Lavonia Dana M.D.   On: 12/12/2014 10:05   Ct Head Wo Contrast  12/15/2014   CLINICAL DATA:  Delirium secondary to alcohol withdrawal  EXAM: CT HEAD WITHOUT CONTRAST  TECHNIQUE: Contiguous axial images were obtained from the base of the skull through the vertex without intravenous contrast.  COMPARISON:  None.  FINDINGS: There is no evidence of mass effect, midline shift, or extra-axial  fluid collections. There is no evidence of a space-occupying lesion or intracranial hemorrhage. There is no evidence of a cortical-based area of acute infarction. There is generalized cerebral atrophy. There is periventricular white matter low attenuation likely secondary to microangiopathy.  The ventricles and sulci are appropriate for the patient's age. The basal cisterns are patent.  Visualized portions of the orbits are unremarkable. The visualized portions of the paranasal sinuses and mastoid air cells are unremarkable. Cerebrovascular atherosclerotic calcifications are noted.  The osseous structures are unremarkable.  IMPRESSION: No acute intracranial pathology.   Electronically Signed   By: Kathreen Devoid   On: 12/15/2014 16:05   Ct Chest Wo Contrast  12/04/2014   CLINICAL DATA:  Right hemithorax opacification, assess hemothorax, effusion and loculation. Review of electronic records demonstrates history of productive cough and shortness of breath for 3 weeks. Recent falls.  EXAM: CT CHEST WITHOUT CONTRAST  TECHNIQUE: Multidetector CT imaging of the chest was performed following the standard protocol without IV contrast.  COMPARISON:  Chest radiograph 12/02/2014  FINDINGS: Large volume of loculated right pleural fluid. Within the right lower hemithorax are multiple foci of air within the large volume pleural fluid raising concern for empyema. There is mass effect on the adjacent bronchi, with compressive atelectasis of the dependent right upper lobe and consolidation in the right middle lobe with air bronchograms. The origin of the right lower lobe bronchus is not well-defined, and no aerated right lower lobe lung is confidently identified. Pleural fluid is heterogeneous in density.  There is a small left pleural effusion that appears simple fluid in density. Adjacent compressive atelectasis in the lower lobe. The left upper lobe is clear.  Heart at the upper limits of normal in size. There are dense coronary  artery calcifications. No definite pericardial fluid. Prominent small superior mediastinal lymph nodes measuring 9 mm short axis dimension. Limited assessment for hilar adenopathy. Mild atherosclerosis of normal caliber thoracic aorta.  No definite acute abnormality in the included upper abdomen, perinephric stranding is partially included.  There are no acute rib fractures. Remote nondisplaced lower right rib fractures with well-formed callus. Age related degenerative change throughout spine. No blastic or destructive lytic osseous lesions.  IMPRESSION: 1. Large partially loculated heterogeneous right pleural effusion. In the lower portion are multiple foci of air. Findings are most concerning for empyema. There is consolidation or atelectasis of the entire right lower lobe, consolidation with air bronchograms in the right middle lobe, and compressive atelectasis of the adjacent right upper lobe. Hemothorax is felt less likely, given the right lower rib fractures appear remote. 2. Small left pleural effusion with adjacent compressive atelectasis. 3. Dense coronary artery calcifications.   Electronically Signed   By: Jeb Levering M.D.   On: 12/04/2014 02:42   Dg Chest Port 1 View  12/16/2014   CLINICAL DATA:  Pneumonia.  EXAM: PORTABLE CHEST - 1 VIEW  COMPARISON:  12/14/2014 .  FINDINGS: Interim removal of NG tube. Right IJ line stable position. Stable cardiomegaly. Persistent but partially clearing bilateral pulmonary infiltrates and bilateral pleural effusions. No pneumothorax .  IMPRESSION: 1. Interim removal of NG tube.  Right IJ line in stable position. 2. Persistent but partially clearing bilateral pulmonary infiltrates and bilateral pleural effusions .   Electronically Signed   By: Marcello Moores  Register   On: 12/16/2014 08:00   Dg Chest Port 1 View  12/14/2014   CLINICAL DATA:  Pneumonia.  EXAM: PORTABLE CHEST - 1 VIEW  COMPARISON:  12/11/2014  FINDINGS: Right jugular catheter tip in the SVC unchanged.  Feeding tube tip in the proximal stomach.  Right lower lobe airspace disease unchanged with small right effusion  Left lower lobe consolidation unchanged, with left effusion  Cardiac enlargement with mild vascular congestion.  IMPRESSION: Right lower lobe infiltrate and effusion, worrisome for pneumonia  Left lower lobe consolidation and effusion possibly atelectasis or pneumonia  Pulmonary vascular congestion.   Electronically Signed   By: Franchot Gallo M.D.   On: 12/14/2014 08:00   Dg Chest Port 1 View  12/11/2014   CLINICAL DATA:  69 year old male with history of empyema.  EXAM: PORTABLE CHEST - 1 VIEW  COMPARISON:  Chest x-ray 12/10/2014.  FINDINGS: Previously noted right-sided chest tube has been removed. No appreciable right pneumothorax. Right IJ catheter with tip terminating in the distal superior vena cava. Feeding tube extending into the upper abdomen, with tip below the lower margin of the image. Lung volumes are slightly low. Bibasilar opacities may reflect areas of atelectasis and/or consolidation. Small to moderate bilateral pleural effusions. Cephalization of pulmonary vasculature. Mild cardiomegaly. Upper mediastinal contours are slightly distorted by patient positioning.  IMPRESSION: 1. Support apparatus, as above. 2. No definite right pneumothorax following right-sided chest tube removal. 3. Bibasilar opacities may reflect areas of atelectasis and/or consolidation, with superimposed small to moderate bilateral pleural effusions. 4. Cardiomegaly with pulmonary venous congestion, but no frank pulmonary edema.   Electronically Signed   By: Vinnie Langton M.D.   On: 12/11/2014 08:36   Dg Chest Port 1 View  12/10/2014   CLINICAL DATA:  69 year old male status post VATS for empyema. Initial encounter.  EXAM: PORTABLE CHEST - 1 VIEW  COMPARISON:  12/09/2014 and earlier.  FINDINGS: Portable AP semi upright view at 0710 hrs. Enteric feeding tube is been placed, tip not included. 1 of the 2 right  chest tubes has been removed. Single residual right side chest tube and right IJ central line  appear stable. Stable lung volumes. Stable cardiac size and mediastinal contours. Residual peripheral and basilar opacity in the right lung is stable. No pneumothorax. Left lower lobe atelectasis.  IMPRESSION: 1. One right chest tube removed, 1 remains. No pneumothorax identified. 2. Feeding tube placed, tip not included. 3. Stable right lung ventilation. Left lung remarkable for lower lobe atelectasis.   Electronically Signed   By: Genevie Ann M.D.   On: 12/10/2014 07:49   Dg Chest Port 1 View  12/09/2014   CLINICAL DATA:  Post VATS, empyema, history prostate cancer, hypertension, diabetes mellitus  EXAM: PORTABLE CHEST - 1 VIEW  COMPARISON:  Portable exam 0647 hours compared 12/08/2014  FINDINGS: RIGHT jugular central venous catheter with tip projecting over SVC.  Pair of RIGHT thoracostomy tubes unchanged.  Mild enlargement of cardiac silhouette.  Stable mediastinal contours and pulmonary vascularity.  Mild residual pleural-based opacity in the RIGHT hemi thorax with persisting consolidation in RIGHT lower lobe.  LEFT lung clear.  No pneumothorax.  IMPRESSION: Stable RIGHT thoracostomy tubes with persistent RIGHT lower lobe consolidation and mild residual pleural-based opacity.   Electronically Signed   By: Lavonia Dana M.D.   On: 12/09/2014 07:55   Dg Chest Port 1 View  12/08/2014   CLINICAL DATA:  Empyema  EXAM: PORTABLE CHEST - 1 VIEW  COMPARISON:  12/07/2014  FINDINGS: Three chest tubes on the right are unchanged in position. No pneumothorax. Right lower lobe infiltrate. Minimal right effusion.  Left lower lobe atelectasis has progressed.  Negative for heart failure. Right jugular catheter tip in the SVC. Endotracheal tube and NG tubes removed.  IMPRESSION: Three chest tubes remain on the right without pneumothorax. Right lower lobe consolidation remains.  Increase in left lower lobe atelectasis.  Endotracheal tube  removed in the interval.   Electronically Signed   By: Franchot Gallo M.D.   On: 12/08/2014 08:03   Dg Chest Port 1 View  12/07/2014   CLINICAL DATA:  Empyema.  EXAM: PORTABLE CHEST - 1 VIEW  COMPARISON:  12/06/2014  FINDINGS: The endotracheal tube tip is just below the clavicular heads. The orogastric tube reaches the stomach at least. Right IJ central line, tip at the distal SVC.  3 right-sided thoracostomy tubes are in stable position. There is no definitive pneumothorax. Compared to preoperative study, pleural effusion remains decreased. Right basilar pneumonia is stable. Streaky left lower lobe opacity has slowly increased over the past 2 days, consistent with atelectasis. No pulmonary edema.  IMPRESSION: 1. Stable positioning of tubes and central line. 2. Recent right empyema drainage with no pneumothorax or increasing pleural fluid. 3. Stable right basilar pneumonia and left basilar atelectasis.   Electronically Signed   By: Monte Fantasia M.D.   On: 12/07/2014 07:41   Portable Chest Xray  12/06/2014   CLINICAL DATA:  Hypoxia  EXAM: PORTABLE CHEST - 1 VIEW  COMPARISON:  December 05, 2014  FINDINGS: Endotracheal tube tip is 5.3 cm above the carina. Central catheter tip is in the superior cava. Nasogastric tube tip and side port are below the diaphragm. Chest tubes are again noted on the right, unchanged in position. There is no appreciable pneumothorax. There is slightly less airspace consolidation in the right lower lung zone compared to 1 day prior. There is a mild degree of effusion on the right, stable. The left lung is essentially clear. Heart size and pulmonary vascularity are normal. No adenopathy.  IMPRESSION: Tube and catheter positions as described without pneumothorax. Partial clearing of consolidation right  base. No new opacity. Small right effusion. Left lung clear. No change in cardiac silhouette.   Electronically Signed   By: Lowella Grip III M.D.   On: 12/06/2014 07:42   Dg Chest Port 1  View  12/05/2014   CLINICAL DATA:  Acute respiratory failure, hypoxia.  EXAM: PORTABLE CHEST - 1 VIEW  COMPARISON:  12/05/2014  FINDINGS: Right chest tubes and remainder support devices remain in place, unchanged. No pneumothorax. Interval placement of the NG tube into the stomach. Patchy right lung airspace disease slightly worsened since prior study. No confluent opacity on the left. Heart is normal size.  IMPRESSION: Worsening right lung airspace disease. Right chest tubes remain in place without pneumothorax.   Electronically Signed   By: Rolm Baptise M.D.   On: 12/05/2014 16:36   Dg Chest Port 1 View  12/05/2014   CLINICAL DATA:  Pneumothorax.  EXAM: PORTABLE CHEST - 1 VIEW  COMPARISON:  Radiograph 12/04/2014, CT 12/04/2014, chest radiograph 12/02/2014  FINDINGS: 3 RIGHT chest tubes noted. There is interval decrease in volume of the pleural fluid in the RIGHT hemi thorax. No appreciable pneumothorax identified. RIGHT central venous line is in place with tip in the distal SVC. Endotracheal tube is 3.8 cm from carina. LEFT lung is clear.  IMPRESSION: 1. Decrease in pleural fluid in the RIGHT hemi thorax. 2. Three right chest tubes in place without pneumothorax. 3. Endotracheal tube and central venous line appear in good position.   Electronically Signed   By: Suzy Bouchard M.D.   On: 12/05/2014 14:09   Dg Abd Portable 1v  12/11/2014   CLINICAL DATA:  Nasogastric tube placement.  Initial encounter.  EXAM: PORTABLE ABDOMEN - 1 VIEW  COMPARISON:  Abdominal radiograph performed 12/09/2014, and chest radiograph performed earlier today at 7:10 a.m.  FINDINGS: The patient's enteric tube is noted ending overlying the third segment of the duodenum, at appropriate position.  The visualized bowel gas pattern is grossly unremarkable, with scattered air filled loops of small and large bowel seen.  A small right pleural effusion is noted, with right basilar airspace opacification. No free intra-abdominal air is  identified, though evaluation for free air is limited on a single supine view.  Mild degenerative change is noted at the lower lumbar spine.  IMPRESSION: 1. Enteric tube noted ending overlying the third segment of the duodenum, at appropriate position. 2. Small right pleural effusion, with right basilar airspace opacification, mildly more prominent than on the prior study.   Electronically Signed   By: Garald Balding M.D.   On: 12/11/2014 02:01   Dg Abd Portable 1v  12/09/2014   CLINICAL DATA:  Feeding tube placement .  EXAM: PORTABLE ABDOMEN - 1 VIEW  COMPARISON:  None.  FINDINGS: Feeding tube noted with its tip projected over the descending portion of the duodenum. No gastric distention. Degenerative changes lumbar spine. Vascular calcification.  IMPRESSION: Feeding tube noted with tip projected over the descending portion of the duodenum. No gastric distention noted.   Electronically Signed   By: [place]   On: 12/09/2014 13:09   Dg Swallowing Func-speech Pathology  12/04/2014    Objective Swallowing Evaluation:    Patient Details  Name: Logan May MRN: [id number] Date of Birth: December 02, 1945  Today's Date: 12/04/2014 Time: SLP Start Time (ACUTE ONLY): 1235-SLP Stop Time (ACUTE ONLY): 1258 SLP Time Calculation (min) (ACUTE ONLY): 23 min  Past Medical History:  Past Medical History  Diagnosis Date  . BPH (benign prostatic hypertrophy)   .  Prostate cancer     implants   . Vitamin D deficiency     takes Vit D daily  . History of ETT 1999    Dr, Caleen Essex  . Anxiety     takes Valium daily as needed  . Hyperlipidemia     takes Simvastatin nightly  . Hypertension     takes Quinapril and Procardia daily  . Headache(784.0)     occasionally  . Peripheral neuropathy   . Diabetes mellitus, type 2     but doesn't take any meds for it;diet controlled and exercise  . 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 Pneumonia 1960's?  . CAP (community acquired pneumonia) 12/02/2014   Past Surgical History:  Past Surgical History   Procedure Laterality Date  . Foot surgery  May 2002    Dr. Sharol Given   . Left foot casted 4-5 months    . Bilateral  eye laser surgery Bilateral 2000    "related to diabetes"  . Charcot foot left Left 12/15/01  . Laser eye surgery right  12/2002  . Eye surgery    . Tonsillectomy    . I&d extremity Left 09/05/2012    Procedure: IRRIGATION AND DEBRIDEMENT EXTREMITY;  Surgeon: Newt Minion, MD;  Location: [facility];  Service: Orthopedics;  Laterality: Left;   Excision Charcot Collapse Left Foot and Base 5th Metatarsal, Antibiotic  Beads  . Esophagogastroduodenoscopy      at least 62yrs ago  . 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 male with PMH of Diabetes  type 2 with Charcot foot supposedly diet-controlled, hypertension,  generalized anxiety disorder, history of atrial fibrillation not on  anticoagulation presumably secondary to falls. Patient presents to the  hospital with 3 weeks of worsening productive cough with purulent sputum.  Guaifenesin has been helpful, but has not resolved his cough. He also has  been having increased weakness with some mild dyspnea on exertion which is  improved with rest. Also noted concerns of very dry mouth and poor oral  hygiene. CXR 7/28 revealed diffuse opacification of RLL consistent with  PNA with associated pleural effusion. Bedside swallow eval ordered to aide  in ruling out aspiration.   No Data Recorded  Assessment / Plan / Recommendation CHL IP CLINICAL IMPRESSIONS 12/04/2014  Therapy Diagnosis Mild oral phase dysphagia;Moderate pharyngeal phase  dysphagia;Mild cervical esophageal phase dysphagia  Clinical Impression Pt currently demonstrating a mild oral/ moderate  pharyngeal dysphagia that appears sensorimotor in nature. Delay in swallow  initiation accompanied by reduced airway protection  (reduced base of  tongue retraction and minimal epiglottic inversion) resulted in  penetration during the swallow x1 of thin liquids. Reduced epiglottic  inversion/ base of tongue retraction also resulted in moderate-severe  amounts of vallecular residuals across consistencies which led to  penetration post-swallow x1 of thin liquids- pt had a weak throat clear  response and cleared remaining penetrates with cued cough. Pt is not able  to control sip size and large sip of nectar thick liquids resulted in  significant residuals in vallecula, about half of which were cleared with  multiple swallows and cough/ throat clears (cued- pt did not sense).  Nectar thick liquids by teaspoon accompanied by chin tuck reduced  residuals and controlled sip size. Minimal  backflow to cervical esophagus  observed across consistencies. Recommend initiating dysphagia 2 diet,  nectar thick liquids by teaspoon, meds crushed in puree, full supervision  to cue pt to tuck chin, swallow 2x with chin down. Also have pt sit  upright 30 minutes after meal. Pt was able to follow instructions for  strategies during MBS with a visual cue- hopeful that this will continue  at bedside as well but decreased cognitive status continues to put pt at  increased risk of aspiration. SLP will continue to follow closely for diet  tolerance/ consider advancement or repeat MBS as cognitive status  improves.      CHL IP TREATMENT RECOMMENDATION 12/04/2014  Treatment Recommendations Therapy as outlined in treatment plan below     CHL IP DIET RECOMMENDATION 12/04/2014  SLP Diet Recommendations Dysphagia 2 (Fine chop);Nectar  Liquid Administration via (None)  Medication Administration Crushed with puree  Compensations Slow rate;Small sips/bites;Multiple dry swallows after each  bite/sip;Clear throat intermittently;Chin tuck  Postural Changes and/or Swallow Maneuvers (None)     CHL IP OTHER RECOMMENDATIONS 12/04/2014  Recommended Consults (None)  Oral Care  Recommendations Oral care BID  Other Recommendations Order thickener from pharmacy;Clarify dietary  restrictions     No flowsheet data found.   CHL IP FREQUENCY AND DURATION 12/04/2014  Speech Therapy Frequency (ACUTE ONLY) min 2x/week  Treatment Duration 2 weeks     Pertinent Vitals/Pain none    SLP Swallow Goals No flowsheet data found.  No flowsheet data found.    CHL IP REASON FOR REFERRAL 12/04/2014  Reason for Referral Objectively evaluate swallowing function     CHL IP ORAL PHASE 12/04/2014  Lips (None)  Tongue (None)  Mucous membranes (None)  Nutritional status (None)  Other (None)  Oxygen therapy (None)  Oral Phase Impaired  Oral - Pudding Teaspoon (None)  Oral - Pudding Cup (None)  Oral - Honey Teaspoon (None)  Oral - Honey Cup (None)  Oral - Honey Syringe (None)  Oral - Nectar Teaspoon (None)  Oral - Nectar Cup (None)  Oral - Nectar Straw (None)  Oral - Nectar Syringe (None)  Oral - Ice Chips (None)  Oral - Thin Teaspoon (None)  Oral - Thin Cup (None)  Oral - Thin Straw (None)  Oral - Thin Syringe (None)  Oral - Puree (None)  Oral - Mechanical Soft (None)  Oral - Regular (None)  Oral - Multi-consistency (None)  Oral - Pill (None)  Oral Phase - Comment (None)      CHL IP PHARYNGEAL PHASE 12/04/2014  Pharyngeal Phase Impaired  Pharyngeal - Pudding Teaspoon (None)  Penetration/Aspiration details (pudding teaspoon) (None)  Pharyngeal - Pudding Cup (None)  Penetration/Aspiration details (pudding cup) (None)  Pharyngeal - Honey Teaspoon (None)  Penetration/Aspiration details (honey teaspoon) (None)  Pharyngeal - Honey Cup (None)  Penetration/Aspiration details (honey cup) (None)  Pharyngeal - Honey Syringe (None)  Penetration/Aspiration details (honey syringe) (None)  Pharyngeal - Nectar Teaspoon (None)  Penetration/Aspiration details (nectar teaspoon) (None)  Pharyngeal - Nectar Cup (None)  Penetration/Aspiration details (nectar cup) (None)  Pharyngeal - Nectar Straw (None)  Penetration/Aspiration details (nectar  straw) (None)  Pharyngeal - Nectar Syringe (None)  Penetration/Aspiration details (nectar syringe) (None)  Pharyngeal - Ice Chips (None)  Penetration/Aspiration details (ice chips) (None)  Pharyngeal - Thin Teaspoon (None)  Penetration/Aspiration details (thin teaspoon) (None)  Pharyngeal - Thin Cup (None)  Penetration/Aspiration details (thin cup) (None)  Pharyngeal - Thin Straw (None)  Penetration/Aspiration details (thin straw) (None)  Pharyngeal - Thin Syringe (  None)  Penetration/Aspiration details (thin syringe') (None)  Pharyngeal - Puree (None)  Penetration/Aspiration details (puree) (None)  Pharyngeal - Mechanical Soft (None)  Penetration/Aspiration details (mechanical soft) (None)  Pharyngeal - Regular (None)  Penetration/Aspiration details (regular) (None)  Pharyngeal - Multi-consistency (None)  Penetration/Aspiration details (multi-consistency) (None)  Pharyngeal - Pill (None)  Penetration/Aspiration details (pill) (None)  Pharyngeal Comment (None)      CHL IP CERVICAL ESOPHAGEAL PHASE 12/04/2014  Cervical Esophageal Phase Impaired  Pudding Teaspoon (None)  Pudding Cup (None)  Honey Teaspoon (None)  Honey Cup (None)  Honey Straw (None)  Nectar Teaspoon Reduced cricopharyngeal relaxation;Esophageal backflow  into cervical esophagus  Nectar Cup Reduced cricopharyngeal relaxation;Esophageal backflow into  cervical esophagus  Nectar Straw (None)  Nectar Sippy Cup (None)  Thin Teaspoon (None)  Thin Cup Reduced cricopharyngeal relaxation;Esophageal backflow into  cervical esophagus  Thin Straw (None)  Thin Sippy Cup (None)  Cervical Esophageal Comment (None)    No flowsheet data found.         Kern Reap, MA, CCC-SLP 12/04/2014, 1:55 PM [id number]    Thurnell Lose, MD  [facility] Pager:[phone]  If 7PM-7AM, please contact night-coverage [email or web address] Password [email or web address] 12/17/2014, 11:54 AM   LOS: 15 days

## 2014-12-17 NOTE — Clinical Social Work Placement (Addendum)
   CLINICAL SOCIAL WORK PLACEMENT  NOTE  Date:  12/17/2014  Patient Details  Name: JAMESMICHAEL SHADD MRN: [id number] Date of Birth: 09-19-1945  Clinical Social Work is seeking post-discharge placement for this patient at the [place] level of care (*CSW will initial, date and re-position this form in  chart as items are completed):  Yes   Patient/family provided with [place] Work Department's list of facilities offering this level of care within the geographic area requested by the patient (or if unable, by the patient's family).  Yes   Patient/family informed of their freedom to choose among providers that offer the needed level of care, that participate in Medicare, Medicaid or managed care program needed by the patient, have an available bed and are willing to accept the patient.  Yes   Patient/family informed of [facility]'s ownership interest in [facility] and [facility], as well as of the fact that they are under no obligation to receive care at these facilities.  PASRR submitted to EDS on 12/05/14     PASRR number received on       Existing PASRR number confirmed on 12/17/14   [id number]  FL2 transmitted to all facilities in geographic area requested by pt/family on 12/05/14     FL2 transmitted to all facilities within larger geographic area on       Patient informed that his/her managed care company has contracts with or will negotiate with certain facilities, including the following:        Yes   Patient/family informed of bed offers received.  Patient chooses bed at [facility] recommends and patient chooses bed at      Patient to be transferred to [facility] on 12/17/14.  Patient to be transferred to facility by Ambulance     Patient family notified on 12/17/14 of transfer.  Name of family member notified:  [name]     PHYSICIAN Please sign FL2     Additional Comment:   Patient has bed at [facility] and can DC to facility once medically stable. Family has completed paperwork with the facility.   _______________________________________________ Liz Beach MSW, [place], [place], [phone]

## 2014-12-17 NOTE — Care Management Note (Signed)
Case Management Note  Patient Details  Name: Logan May MRN: [id number] Date of Birth: 09-27-1945  Subjective/Objective:  Patient does not want g tube in, patient will be for dc over the w/end to snf, CSW aware.                   Action/Plan:   Expected Discharge Date:                  Expected Discharge Plan:  Skilled Nursing Facility  In-House Referral:  Clinical Social Work  Discharge planning Services  CM Consult  Post Acute Care Choice:    Choice offered to:     DME Arranged:    DME Agency:     HH Arranged:    [place] Agency:     Status of Service:  Completed, signed off  Medicare Important Message Given:  Yes-second notification given Date Medicare IM Given:    Medicare IM give by:    Date Additional Medicare IM Given:    Additional Medicare Important Message give by:     If discussed at [place] of Stay Meetings, dates discussed:    Additional Comments:  Zenon Mayo, RN 12/17/2014, 3:05 PM

## 2014-12-18 LAB — GLUCOSE, CAPILLARY
GLUCOSE-CAPILLARY: 191 mg/dL — AB (ref 65–99)
Glucose-Capillary: 181 mg/dL — ABNORMAL HIGH (ref 65–99)

## 2014-12-18 MED ORDER — MORPHINE SULFATE 2 MG/ML IJ SOLN
1.0000 mg | INTRAMUSCULAR | Status: DC | PRN
  Administered 2014-12-18 – 2014-12-19 (×4): 2 mg via INTRAVENOUS
  Filled 2014-12-18 (×5): qty 1

## 2014-12-18 MED ORDER — LORAZEPAM 2 MG/ML IJ SOLN
0.5000 mg | INTRAMUSCULAR | Status: DC | PRN
  Administered 2014-12-18: 1 mg via INTRAVENOUS
  Filled 2014-12-18: qty 1

## 2014-12-18 MED ORDER — FUROSEMIDE 10 MG/ML IJ SOLN
20.0000 mg | Freq: Once | INTRAMUSCULAR | Status: AC
  Administered 2014-12-18: 20 mg via INTRAVENOUS
  Filled 2014-12-18: qty 2

## 2014-12-18 MED ORDER — GLYCOPYRROLATE 0.2 MG/ML IJ SOLN: 0.1000 mg | Freq: Two times a day (BID) | INTRAMUSCULAR | Status: DC

## 2014-12-18 MED ORDER — VITAMIN B-1 100 MG PO TABS: 100.0000 mg | ORAL_TABLET | Freq: Every day | ORAL | Status: DC

## 2014-12-18 MED ORDER — GLYCOPYRROLATE 0.2 MG/ML IJ SOLN
0.2000 mg | Freq: Two times a day (BID) | INTRAMUSCULAR | Status: DC
  Administered 2014-12-18 (×2): 0.2 mg via INTRAVENOUS
  Filled 2014-12-18 (×5): qty 1

## 2014-12-18 NOTE — Progress Notes (Signed)
PATIENT DETAILS Name: Logan May Age: 69 y.o. 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 PCCM was consulted, underwent thoracocentesis on 7/30-pleural fluid was consistent with an empyema. Subsequently cardiothoracic surgery was consulted, and a right sided VATS, drainage of empyema and visceral/pleural decortication was done on 7/31. Patient subsequently developed respiratory failure and required mechanical ventilation till 8/3. ICU course was complicated by development of atrial fibrillation with RVR requiring amiodarone infusion and severe delirium thought to be secondary to alcohol withdrawal. Upon clinical stability, patient was transferred to the hospitalist service on 8/6.  Subjective:  Is confused but denies any headache, no chest or abdominal pain. Is following basic commands. Denies any focal weakness.  Assessment/Plan:  Severe sepsis from right-sided pneumonia and empyema likely due to underlying micro-aspiration: With acute hypoxic respiratory failure requiring intubation.  Sepsis pathophysiology has resolved following VATS/decortication. He required brief intubation and ICU stay was extubated on 12/07/2014. Blood culture neg, but tissue culture positive for microaerophilic streptococcus will taper down to Rocephin at this point.  He continues to exhibit signs of aspiration, despite NG tube feeding patient is clinically aspirating and I suspect he is even aspirating his oral secretions, therapy on board, I discontinued NG  tube on 12/14/2014, Had added anti-suction and pulmonary toiletry, he shows some clinical improvement since NG tube was discontinued and pulm toiletry was initiated.  He has failed 3 evaluations by speech therapist from 8/9 to 12/16/2014, after detailed discussions with wife, pulmonary physician Dr. Marianna Payment at [facility], speech therapist and input by Dr. Roxan Hockey cardiothoracic surgery. Wife has now agreed for a PEG tube placement. INR has been requested to evaluate and place a GJ tube.  INR was consulted for PEG tube placement the recommended surgery input for open GJ tube, general surgery, palliative care and me all talk to patient-wife and patient's sister in detail. They all wish to pursue only comfort feeds and not to continue any further due placement of artificial feeding. They understand that he will continue to aspirate with comfort feeds and will likely die soon from the same. He will be DO NOT RESUSCITATE now. We are looking for residential hospice placement now.   Addendum wife requested patient be transferred to [facility], discussed the case with [facility] critical care physician Dr. Marianna Payment. On 12/15/2014 1:50 PM. Who agrees that besides feeding tube versus comfort measures he has nothing to offer and agrees with management at this facility.    Right sided Empyema I suspect from long-standing microaspiration: underwent VATS, drainage of empyema and visceral/pleural decortication on 7/31.Tissue culture 7/31 positive for Microaerophilic streptococci,  AFB culture/fungal culture negative so far. ABX adjusted based on cultures - Abx stop date 8/13   Severe Dysphagia: Suspect long-term microaspiration, speech on board, despite having an NG tube patient continued to have clinical evidence of ongoing aspiration of oral secretions and GI content regurgitation. NG tube was removed on 12/14/2014. Shows improvement on 12/15/2014. Has failed 3 evaluations by speech therapy, now due for PEG tube  placement.   Acute Urinary Retention:underwent  Flexible cytoscopy and passage of foley catheter by Dr Gaynelle Arabian on 7/31   Afib DVV:OHYWVPXT Amiodarone gtt for rate control, now on prn lopressor. Felt to be a poor candidate for long term anticoagulation-although has a CHADS2Vasc SCORE of  3. Stared on ASA   ETOH withdrawal with delirium and metabolic encephalopathy:required Precedex gtt while in [place] less agitated-still somewhat confused-continue Ativan per CIWA protocol. Counseled to quit alcohol.   Enterococcal UTI: Seen on urine culture 7/29-this is pansensitive. Has been treated with Zosyn.   Left upper Ext swelling: stable Doppler   Anemia: likely secondary to  critical illness. Follow CBC and transfuse prn if HB<7   Hx of COPD with emphysema:continue bronchodilators-lungs clear   Dyslipidemia:continue Statin   Severe Deconditioning:PT on board-SNF on discharge   Type 2 DM: CBG's stable with SSI. A1C 6.8 on 7/29  CBG (last 3)   Recent Labs  12/17/14 2337 12/18/14 0351 12/18/14 0803  GLUCAP 146* 181* 191*     Hypokalemia and hypomagnesemia. Both replaced.     Disposition: Residential hospice   Antimicrobial agents  See below  Anti-infectives    Start     Dose/Rate Route Frequency Ordered Stop   12/12/14 1600  cefTRIAXone (ROCEPHIN) 1 g in dextrose 5 % 50 mL IVPB     1 g 100 mL/hr over 30 Minutes Intravenous Every 24 hours 12/12/14 1451     12/12/14 1200  levofloxacin (LEVAQUIN) IVPB 750 mg  Status:  Discontinued     750 mg 100 mL/hr over 90 Minutes Intravenous Every 24 hours 12/12/14 1042 12/12/14 1451   12/10/14 2100  piperacillin-tazobactam (ZOSYN) IVPB 3.375 g  Status:  Discontinued     3.375 g 12.5 mL/hr over 240 Minutes Intravenous Every 8 hours 12/10/14 1432 12/12/14 1035   12/05/14 0900  vancomycin (VANCOCIN) IVPB 1000 mg/200 mL premix  Status:  Discontinued     1,000 mg 200 mL/hr over 60 Minutes Intravenous Every 12 hours  12/04/14 2003 12/09/14 0736   12/05/14 0600  vancomycin (VANCOCIN) IVPB 1000 mg/200 mL premix  Status:  Discontinued     1,000 mg 200 mL/hr over 60 Minutes Intravenous To Surgery 12/04/14 1604 12/04/14 2000   12/04/14 2100  piperacillin-tazobactam (ZOSYN) IVPB 3.375 g  Status:  Discontinued     3.375 g 12.5 mL/hr over 240 Minutes Intravenous Every 8 hours 12/04/14 2003 12/10/14 1432   12/04/14 2100  vancomycin (VANCOCIN) 1,500 mg in sodium chloride 0.9 % 500 mL IVPB     1,500 mg 250 mL/hr over 120 Minutes Intravenous  Once 12/04/14 2003 12/04/14 2348   12/03/14 1330  levofloxacin (LEVAQUIN) IVPB 750 mg  Status:  Discontinued     750 mg 100 mL/hr over 90 Minutes Intravenous Every 24 hours 12/02/14 1514 12/04/14 1951   12/02/14 1130  cefTRIAXone (ROCEPHIN) 1 g in dextrose 5 % 50 mL IVPB     1 g 100 mL/hr over 30 Minutes Intravenous  Once 12/02/14 1118 12/02/14 1246   12/02/14 1130  azithromycin (ZITHROMAX) 500 mg in dextrose 5 % 250 mL IVPB     500 mg 250 mL/hr over 60 Minutes Intravenous  Once 12/02/14 1118 12/02/14 1429      DVT Prophylaxis: Prophylactic Lovenox   Code Status: Full code   Family Communication Wife bedside 12-12-14  Procedures: 7/30>>Thoracocentesis 7/31>>VATS, drainage of empyema and visceral/pleural decortication(Dr Hendrickson) 7/31>> Flexible cytoscopy and passage of foley catheter (Dr Gaynelle Arabian) 7/31>>8/3 ETT, R.IJ C line 12/12/2014 bilateral upper extremity venous duplex - no DVT  SVT  CONSULTS:   pulmonary/intensive care, urology and CTVS, Pall Care  Time spent  40 minutes-Greater than 50% of this time was spent in counseling, explanation of diagnosis, planning of further management, and coordination of care.  MEDICATIONS: Scheduled Meds: . cefTRIAXone (ROCEPHIN)  IV  1 g Intravenous Q24H  . enoxaparin (LOVENOX) injection  40 mg Subcutaneous Q24H  . glycopyrrolate  0.2 mg Intravenous Q12H  . ipratropium-albuterol  3 mL Nebulization TID  .  [START ON 12/19/2014] thiamine  100 mg Oral Daily   Continuous Infusions:   PRN Meds:.acetaminophen, albuterol, Gerhardt's butt cream, ipratropium-albuterol, LORazepam, metoprolol, morphine injection, ondansetron (ZOFRAN) IV, RESOURCE THICKENUP CLEAR, sodium chloride    PHYSICAL EXAM: Vital signs in last 24 hours: Filed Vitals:   12/18/14 0500 12/18/14 0508 12/18/14 0742 12/18/14 1300  BP:  154/73  131/87  Pulse:  119  121  Temp:  98.4 F (36.9 C)  98.7 F (37.1 C)  TempSrc:  Oral  Oral  Resp:  20  18  Height:      Weight: 81.7 kg (180 lb 1.9 oz)     SpO2:  90% 95% 94%    Weight change: 0.2 kg (7.1 oz) Filed Weights   12/16/14 0700 12/17/14 0450 12/18/14 0500  Weight: 82.4 kg (181 lb 10.5 oz) 81.5 kg (179 lb 10.8 oz) 81.7 kg (180 lb 1.9 oz)   Body mass index is 25.13 kg/(m^2).   Gen Exam: Awake but confused-however follows some commands. Panda tube in place Neck: Supple, No JVD.  Chest: B/L Clear anteriorly CVS: S1 S2 Regular, no murmurs.  Abdomen: soft, BS +, non tender Extremities: no edema, lower extremities warm to touch. Neurologic: Non Focal-but with gen weakness  Intake/Output from previous day:  Intake/Output Summary (Last 24 hours) at 12/18/14 1320 Last data filed at 12/18/14 1106  Gross per 24 hour  Intake   2220 ml  Output   2950 ml  Net   -730 ml     LAB RESULTS: CBC  Recent Labs Lab 12/13/14 0458 12/14/14 0520 12/16/14 0420 12/17/14 0400  WBC 11.2* 10.9* 9.3 8.3  HGB 8.1* 8.0* 7.6* 7.7*  HCT 25.8* 25.5* 24.4* 24.6*  PLT 428* 405* 378 378  MCV 89.9 89.8 91.7 90.4  MCH 28.2 28.2 28.6 28.3  MCHC 31.4 31.4 31.1 31.3  RDW 15.9* 16.0* 15.7* 15.6*    Chemistries   Recent Labs Lab 12/13/14 0458 12/14/14 0520 12/15/14 0612 12/16/14 0420 12/17/14 0400  NA 141 141 142 141 138  K 3.3* 3.3* 3.4* 3.3* 3.3*  CL 109 106 105 101 99*  CO2 26 27 29 30  32  GLUCOSE 203* 206* 106* 121* 150*  BUN 8 10 7 6  5*  CREATININE 0.93 1.02 0.94 0.99 0.90   CALCIUM 7.3* 7.5* 7.4* 7.4* 7.3*  MG 1.4* 1.6* 1.7 1.7 1.5*    CBG:  Recent Labs Lab 12/17/14 1703 12/17/14 2017 12/17/14 2337 12/18/14 0351 12/18/14 0803  GLUCAP 139* 121* 146* 181* 191*    GFR Estimated Creatinine Clearance: 81.3 mL/min (by C-G formula based on Cr of 0.9).  Coagulation profile  Recent Labs Lab 12/17/14 0400  INR 1.22    Cardiac Enzymes No results for input(s): CKMB, TROPONINI, MYOGLOBIN in the last 168 hours.  Invalid input(s): CK  Invalid input(s): POCBNP No results for input(s): DDIMER in the last 72 hours. No results for input(s): HGBA1C in the last 72 hours. No results for input(s): CHOL, HDL, LDLCALC, TRIG, CHOLHDL, LDLDIRECT in the last 72  hours. No results for input(s): TSH, T4TOTAL, T3FREE, THYROIDAB in the last 72 hours.  Invalid input(s): FREET3 No results for input(s): VITAMINB12, FOLATE, FERRITIN, TIBC, IRON, RETICCTPCT in the last 72 hours. No results for input(s): LIPASE, AMYLASE in the last 72 hours.  Urine Studies No results for input(s): UHGB, CRYS in the last 72 hours.  Invalid input(s): UACOL, UAPR, USPG, UPH, UTP, UGL, UKET, UBIL, UNIT, UROB, ULEU, UEPI, UWBC, URBC, UBAC, CAST, UCOM, BILUA  MICROBIOLOGY: No results found for this or any previous visit (from the past 240 hour(s)).  RADIOLOGY STUDIES/RESULTS: Ct Abdomen Wo Contrast  12/16/2014   CLINICAL DATA:  Evaluate anatomy for possible gastrostomy tube placement. History of dysphagia.  EXAM: CT ABDOMEN WITHOUT CONTRAST  TECHNIQUE: Multidetector CT imaging of the abdomen was performed following the standard protocol without IV contrast.  COMPARISON:  12/12/2014  FINDINGS: There are bilateral pleural effusions, left side greater the lack. There is volume loss in the left lower lobe with consolidation and volume loss in the right lower lobe. Evidence for coronary artery calcifications.  There is no significant hiatal hernia. The transverse colon is anterior to the stomach  and liver in the upper abdomen. Few small bowel loops in the left upper abdomen. No significant dilatation of small or large bowel.  No gross abnormality to the liver. There are calcifications in the gallbladder, compatible with cholelithiasis. No significant gallbladder distention. Normal appearance of the spleen. Atherosclerotic calcifications in the splenic artery. Normal appearance of the adrenal glands. Mild perinephric stranding bilaterally which may be chronic. No significant hydronephrosis or kidney stones. Atherosclerotic calcifications in the aorta and visceral arteries without aneurysm. No significant lymphadenopathy in the upper abdomen.  There is severe disc space narrowing and endplate disease in the lumbar spine involving L2-L5. Complete loss of the disc space at L3-L4.  IMPRESSION: The transverse colon is located in the anterior upper abdomen. The location of the transverse colon may be problematic for placement of a percutaneous gastrostomy tube.  Bilateral pleural effusions with consolidation in the right lower lobe. Cannot exclude an infectious or inflammatory process in the right lower lobe.  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   Electronically Signed   By: Markus Daft M.D.   On: 12/16/2014 15:28   Dg Chest 1 View  12/04/2014   CLINICAL DATA:  Post right thoracentesis  EXAM: CHEST  1 VIEW  COMPARISON:  12/02/2014  FINDINGS: Diffuse right lung airspace disease again noted, slightly improved. Decreasing right effusion following thoracentesis. No pneumothorax. Heart is borderline in size. Nodular density projecting over the left lung base is felt represent nipple shadow. Left lung is clear. No acute bony abnormality.  IMPRESSION: Decreasing right effusion and diffuse right lung airspace disease. No pneumothorax.   Electronically Signed   By: Rolm Baptise M.D.   On: 12/04/2014 13:10   Dg Chest 2 View  12/02/2014   CLINICAL DATA:  Altered level of consciousness.  EXAM: CHEST  2 VIEW   COMPARISON:  Sep 04, 2012.  FINDINGS: The heart size and mediastinal contours are within normal limits. No pneumothorax is noted. Left lung is clear. There is now noted diffuse opacification of the right lower lobe most consistent with pneumonia with associated pleural effusion. The visualized skeletal structures are unremarkable.  IMPRESSION: Diffuse opacification of the right lower lobe most consistent with pneumonia with associated pleural effusion. Follow-up radiographs are recommended.   Electronically Signed   By: Marijo Conception, M.D.   On: 12/02/2014 10:57  Dg Abd 1 View  12/12/2014   CLINICAL DATA:  Feeding tube placement  EXAM: ABDOMEN - 1 VIEW  COMPARISON:  12/10/2014  FINDINGS: Tip of feeding tube projects over proximal stomach.  Air-filled upper normal caliber loops of small bowel.  Scattered colonic gas.  No evidence of bowel obstruction or dilatation.  Scattered degenerative disc disease changes lumbar spine.  Foley catheter in urinary bladder.  IMPRESSION: Tip of feeding tube projects over proximal stomach.   Electronically Signed   By: Lavonia Dana M.D.   On: 12/12/2014 10:05   Ct Head Wo Contrast  12/15/2014   CLINICAL DATA:  Delirium secondary to alcohol withdrawal  EXAM: CT HEAD WITHOUT CONTRAST  TECHNIQUE: Contiguous axial images were obtained from the base of the skull through the vertex without intravenous contrast.  COMPARISON:  None.  FINDINGS: There is no evidence of mass effect, midline shift, or extra-axial fluid collections. There is no evidence of a space-occupying lesion or intracranial hemorrhage. There is no evidence of a cortical-based area of acute infarction. There is generalized cerebral atrophy. There is periventricular white matter low attenuation likely secondary to microangiopathy.  The ventricles and sulci are appropriate for the patient's age. The basal cisterns are patent.  Visualized portions of the orbits are unremarkable. The visualized portions of the paranasal  sinuses and mastoid air cells are unremarkable. Cerebrovascular atherosclerotic calcifications are noted.  The osseous structures are unremarkable.  IMPRESSION: No acute intracranial pathology.   Electronically Signed   By: Kathreen Devoid   On: 12/15/2014 16:05   Ct Chest Wo Contrast  12/04/2014   CLINICAL DATA:  Right hemithorax opacification, assess hemothorax, effusion and loculation. Review of electronic records demonstrates history of productive cough and shortness of breath for 3 weeks. Recent falls.  EXAM: CT CHEST WITHOUT CONTRAST  TECHNIQUE: Multidetector CT imaging of the chest was performed following the standard protocol without IV contrast.  COMPARISON:  Chest radiograph 12/02/2014  FINDINGS: Large volume of loculated right pleural fluid. Within the right lower hemithorax are multiple foci of air within the large volume pleural fluid raising concern for empyema. There is mass effect on the adjacent bronchi, with compressive atelectasis of the dependent right upper lobe and consolidation in the right middle lobe with air bronchograms. The origin of the right lower lobe bronchus is not well-defined, and no aerated right lower lobe lung is confidently identified. Pleural fluid is heterogeneous in density.  There is a small left pleural effusion that appears simple fluid in density. Adjacent compressive atelectasis in the lower lobe. The left upper lobe is clear.  Heart at the upper limits of normal in size. There are dense coronary artery calcifications. No definite pericardial fluid. Prominent small superior mediastinal lymph nodes measuring 9 mm short axis dimension. Limited assessment for hilar adenopathy. Mild atherosclerosis of normal caliber thoracic aorta.  No definite acute abnormality in the included upper abdomen, perinephric stranding is partially included.  There are no acute rib fractures. Remote nondisplaced lower right rib fractures with well-formed callus. Age related degenerative change  throughout spine. No blastic or destructive lytic osseous lesions.  IMPRESSION: 1. Large partially loculated heterogeneous right pleural effusion. In the lower portion are multiple foci of air. Findings are most concerning for empyema. There is consolidation or atelectasis of the entire right lower lobe, consolidation with air bronchograms in the right middle lobe, and compressive atelectasis of the adjacent right upper lobe. Hemothorax is felt less likely, given the right lower rib fractures appear remote. 2.  Small left pleural effusion with adjacent compressive atelectasis. 3. Dense coronary artery calcifications.   Electronically Signed   By: Jeb Levering M.D.   On: 12/04/2014 02:42   Dg Chest Port 1 View  12/16/2014   CLINICAL DATA:  Pneumonia.  EXAM: PORTABLE CHEST - 1 VIEW  COMPARISON:  12/14/2014 .  FINDINGS: Interim removal of NG tube. Right IJ line stable position. Stable cardiomegaly. Persistent but partially clearing bilateral pulmonary infiltrates and bilateral pleural effusions. No pneumothorax .  IMPRESSION: 1. Interim removal of NG tube.  Right IJ line in stable position. 2. Persistent but partially clearing bilateral pulmonary infiltrates and bilateral pleural effusions .   Electronically Signed   By: Marcello Moores  Register   On: 12/16/2014 08:00   Dg Chest Port 1 View  12/14/2014   CLINICAL DATA:  Pneumonia.  EXAM: PORTABLE CHEST - 1 VIEW  COMPARISON:  12/11/2014  FINDINGS: Right jugular catheter tip in the SVC unchanged. Feeding tube tip in the proximal stomach.  Right lower lobe airspace disease unchanged with small right effusion  Left lower lobe consolidation unchanged, with left effusion  Cardiac enlargement with mild vascular congestion.  IMPRESSION: Right lower lobe infiltrate and effusion, worrisome for pneumonia  Left lower lobe consolidation and effusion possibly atelectasis or pneumonia  Pulmonary vascular congestion.   Electronically Signed   By: Franchot Gallo M.D.   On: 12/14/2014  08:00   Dg Chest Port 1 View  12/11/2014   CLINICAL DATA:  69 year old male with history of empyema.  EXAM: PORTABLE CHEST - 1 VIEW  COMPARISON:  Chest x-ray 12/10/2014.  FINDINGS: Previously noted right-sided chest tube has been removed. No appreciable right pneumothorax. Right IJ catheter with tip terminating in the distal superior vena cava. Feeding tube extending into the upper abdomen, with tip below the lower margin of the image. Lung volumes are slightly low. Bibasilar opacities may reflect areas of atelectasis and/or consolidation. Small to moderate bilateral pleural effusions. Cephalization of pulmonary vasculature. Mild cardiomegaly. Upper mediastinal contours are slightly distorted by patient positioning.  IMPRESSION: 1. Support apparatus, as above. 2. No definite right pneumothorax following right-sided chest tube removal. 3. Bibasilar opacities may reflect areas of atelectasis and/or consolidation, with superimposed small to moderate bilateral pleural effusions. 4. Cardiomegaly with pulmonary venous congestion, but no frank pulmonary edema.   Electronically Signed   By: Vinnie Langton M.D.   On: 12/11/2014 08:36   Dg Chest Port 1 View  12/10/2014   CLINICAL DATA:  69 year old male status post VATS for empyema. Initial encounter.  EXAM: PORTABLE CHEST - 1 VIEW  COMPARISON:  12/09/2014 and earlier.  FINDINGS: Portable AP semi upright view at 0710 hrs. Enteric feeding tube is been placed, tip not included. 1 of the 2 right chest tubes has been removed. Single residual right side chest tube and right IJ central line appear stable. Stable lung volumes. Stable cardiac size and mediastinal contours. Residual peripheral and basilar opacity in the right lung is stable. No pneumothorax. Left lower lobe atelectasis.  IMPRESSION: 1. One right chest tube removed, 1 remains. No pneumothorax identified. 2. Feeding tube placed, tip not included. 3. Stable right lung ventilation. Left lung remarkable for lower  lobe atelectasis.   Electronically Signed   By: Genevie Ann M.D.   On: 12/10/2014 07:49   Dg Chest Port 1 View  12/09/2014   CLINICAL DATA:  Post VATS, empyema, history prostate cancer, hypertension, diabetes mellitus  EXAM: PORTABLE CHEST - 1 VIEW  COMPARISON:  Portable exam  0647 hours compared 12/08/2014  FINDINGS: RIGHT jugular central venous catheter with tip projecting over SVC.  Pair of RIGHT thoracostomy tubes unchanged.  Mild enlargement of cardiac silhouette.  Stable mediastinal contours and pulmonary vascularity.  Mild residual pleural-based opacity in the RIGHT hemi thorax with persisting consolidation in RIGHT lower lobe.  LEFT lung clear.  No pneumothorax.  IMPRESSION: Stable RIGHT thoracostomy tubes with persistent RIGHT lower lobe consolidation and mild residual pleural-based opacity.   Electronically Signed   By: Lavonia Dana M.D.   On: 12/09/2014 07:55   Dg Chest Port 1 View  12/08/2014   CLINICAL DATA:  Empyema  EXAM: PORTABLE CHEST - 1 VIEW  COMPARISON:  12/07/2014  FINDINGS: Three chest tubes on the right are unchanged in position. No pneumothorax. Right lower lobe infiltrate. Minimal right effusion.  Left lower lobe atelectasis has progressed.  Negative for heart failure. Right jugular catheter tip in the SVC. Endotracheal tube and NG tubes removed.  IMPRESSION: Three chest tubes remain on the right without pneumothorax. Right lower lobe consolidation remains.  Increase in left lower lobe atelectasis.  Endotracheal tube removed in the interval.   Electronically Signed   By: Franchot Gallo M.D.   On: 12/08/2014 08:03   Dg Chest Port 1 View  12/07/2014   CLINICAL DATA:  Empyema.  EXAM: PORTABLE CHEST - 1 VIEW  COMPARISON:  12/06/2014  FINDINGS: The endotracheal tube tip is just below the clavicular heads. The orogastric tube reaches the stomach at least. Right IJ central line, tip at the distal SVC.  3 right-sided thoracostomy tubes are in stable position. There is no definitive pneumothorax.  Compared to preoperative study, pleural effusion remains decreased. Right basilar pneumonia is stable. Streaky left lower lobe opacity has slowly increased over the past 2 days, consistent with atelectasis. No pulmonary edema.  IMPRESSION: 1. Stable positioning of tubes and central line. 2. Recent right empyema drainage with no pneumothorax or increasing pleural fluid. 3. Stable right basilar pneumonia and left basilar atelectasis.   Electronically Signed   By: Monte Fantasia M.D.   On: 12/07/2014 07:41   Portable Chest Xray  12/06/2014   CLINICAL DATA:  Hypoxia  EXAM: PORTABLE CHEST - 1 VIEW  COMPARISON:  December 05, 2014  FINDINGS: Endotracheal tube tip is 5.3 cm above the carina. Central catheter tip is in the superior cava. Nasogastric tube tip and side port are below the diaphragm. Chest tubes are again noted on the right, unchanged in position. There is no appreciable pneumothorax. There is slightly less airspace consolidation in the right lower lung zone compared to 1 day prior. There is a mild degree of effusion on the right, stable. The left lung is essentially clear. Heart size and pulmonary vascularity are normal. No adenopathy.  IMPRESSION: Tube and catheter positions as described without pneumothorax. Partial clearing of consolidation right base. No new opacity. Small right effusion. Left lung clear. No change in cardiac silhouette.   Electronically Signed   By: Lowella Grip III M.D.   On: 12/06/2014 07:42   Dg Chest Port 1 View  12/05/2014   CLINICAL DATA:  Acute respiratory failure, hypoxia.  EXAM: PORTABLE CHEST - 1 VIEW  COMPARISON:  12/05/2014  FINDINGS: Right chest tubes and remainder support devices remain in place, unchanged. No pneumothorax. Interval placement of the NG tube into the stomach. Patchy right lung airspace disease slightly worsened since prior study. No confluent opacity on the left. Heart is normal size.  IMPRESSION: Worsening right lung airspace  disease. Right chest tubes  remain in place without pneumothorax.   Electronically Signed   By: Rolm Baptise M.D.   On: 12/05/2014 16:36   Dg Chest Port 1 View  12/05/2014   CLINICAL DATA:  Pneumothorax.  EXAM: PORTABLE CHEST - 1 VIEW  COMPARISON:  Radiograph 12/04/2014, CT 12/04/2014, chest radiograph 12/02/2014  FINDINGS: 3 RIGHT chest tubes noted. There is interval decrease in volume of the pleural fluid in the RIGHT hemi thorax. No appreciable pneumothorax identified. RIGHT central venous line is in place with tip in the distal SVC. Endotracheal tube is 3.8 cm from carina. LEFT lung is clear.  IMPRESSION: 1. Decrease in pleural fluid in the RIGHT hemi thorax. 2. Three right chest tubes in place without pneumothorax. 3. Endotracheal tube and central venous line appear in good position.   Electronically Signed   By: Suzy Bouchard M.D.   On: 12/05/2014 14:09   Dg Abd Portable 1v  12/11/2014   CLINICAL DATA:  Nasogastric tube placement.  Initial encounter.  EXAM: PORTABLE ABDOMEN - 1 VIEW  COMPARISON:  Abdominal radiograph performed 12/09/2014, and chest radiograph performed earlier today at 7:10 a.m.  FINDINGS: The patient's enteric tube is noted ending overlying the third segment of the duodenum, at appropriate position.  The visualized bowel gas pattern is grossly unremarkable, with scattered air filled loops of small and large bowel seen.  A small right pleural effusion is noted, with right basilar airspace opacification. No free intra-abdominal air is identified, though evaluation for free air is limited on a single supine view.  Mild degenerative change is noted at the lower lumbar spine.  IMPRESSION: 1. Enteric tube noted ending overlying the third segment of the duodenum, at appropriate position. 2. Small right pleural effusion, with right basilar airspace opacification, mildly more prominent than on the prior study.   Electronically Signed   By: Garald Balding M.D.   On: 12/11/2014 02:01   Dg Abd Portable 1v  12/09/2014    CLINICAL DATA:  Feeding tube placement .  EXAM: PORTABLE ABDOMEN - 1 VIEW  COMPARISON:  None.  FINDINGS: Feeding tube noted with its tip projected over the descending portion of the duodenum. No gastric distention. Degenerative changes lumbar spine. Vascular calcification.  IMPRESSION: Feeding tube noted with tip projected over the descending portion of the duodenum. No gastric distention noted.   Electronically Signed   By: Taylor Mill   On: 12/09/2014 13:09   Dg Swallowing Func-speech Pathology  12/04/2014    Objective Swallowing Evaluation:    Patient Details  Name: Logan May MRN: [id number] Date of Birth: May 14, 1945  Today's Date: 12/04/2014 Time: SLP Start Time (ACUTE ONLY): 1235-SLP Stop Time (ACUTE ONLY): 1258 SLP Time Calculation (min) (ACUTE ONLY): 23 min  Past Medical History:  Past Medical History  Diagnosis Date  . BPH (benign prostatic hypertrophy)   . Prostate cancer     implants   . Vitamin D deficiency     takes Vit D daily  . History of ETT 1999    Dr, Caleen Essex  . Anxiety     takes Valium daily as needed  . Hyperlipidemia     takes Simvastatin nightly  . Hypertension     takes Quinapril and Procardia daily  . Headache(784.0)     occasionally  . Peripheral neuropathy   . Diabetes mellitus, type 2     but doesn't take any meds for it;diet controlled and exercise  . 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  Pneumonia 1960's?  . CAP (community acquired pneumonia) 12/02/2014   Past Surgical History:  Past Surgical History  Procedure Laterality Date  . Foot surgery  May 2002    Dr. Sharol Given   . Left foot casted 4-5 months    . Bilateral  eye laser surgery Bilateral 2000    "related to diabetes"  . Charcot foot left Left 12/15/01  . Laser eye surgery right  12/2002  . Eye surgery    . Tonsillectomy    . I&d extremity Left 09/05/2012    Procedure: IRRIGATION AND DEBRIDEMENT EXTREMITY;  Surgeon: Newt Minion, MD;  Location: [facility];  Service: Orthopedics;  Laterality: Left;   Excision Charcot Collapse Left  Foot and Base 5th Metatarsal, Antibiotic  Beads  . Esophagogastroduodenoscopy      at least 18yrs ago  . Amputation Left 05/20/2013    Procedure: AMPUTATION DIGIT;  Surgeon: Newt Minion, MD;  Location: [facility];  Service: Orthopedics;  Laterality: Left;  Left Great Toe Amputation  at [facility]   HPI:  Other Pertinent Information: Pt is a 69 y.o. male with PMH of Diabetes  type 2 with Charcot foot supposedly diet-controlled, hypertension,  generalized anxiety disorder, history of atrial fibrillation not on  anticoagulation presumably secondary to falls. Patient presents to the  hospital with 3 weeks of worsening productive cough with purulent sputum.  Guaifenesin has been helpful, but has not resolved his cough. He also has  been having increased weakness with some mild dyspnea on exertion which is  improved with rest. Also noted concerns of very dry mouth and poor oral  hygiene. CXR 7/28 revealed diffuse opacification of RLL consistent with  PNA with associated pleural effusion. Bedside swallow eval ordered to aide  in ruling out aspiration.   No Data Recorded  Assessment / Plan / Recommendation CHL IP CLINICAL IMPRESSIONS 12/04/2014  Therapy Diagnosis Mild oral phase dysphagia;Moderate pharyngeal phase  dysphagia;Mild cervical esophageal phase dysphagia  Clinical Impression Pt currently demonstrating a mild oral/ moderate  pharyngeal dysphagia that appears sensorimotor in nature. Delay in swallow  initiation accompanied by reduced airway protection (reduced base of  tongue retraction and minimal epiglottic inversion) resulted in  penetration during the swallow x1 of thin liquids. Reduced epiglottic  inversion/ base of tongue retraction also resulted in moderate-severe  amounts of vallecular residuals across consistencies which led to  penetration post-swallow x1 of thin liquids- pt had a weak throat clear  response and cleared remaining penetrates with cued cough. Pt is not able  to control sip size and large sip of nectar  thick liquids resulted in  significant residuals in vallecula, about half of which were cleared with  multiple swallows and cough/ throat clears (cued- pt did not sense).  Nectar thick liquids by teaspoon accompanied by chin tuck reduced  residuals and controlled sip size. Minimal backflow to cervical esophagus  observed across consistencies. Recommend initiating dysphagia 2 diet,  nectar thick liquids by teaspoon, meds crushed in puree, full supervision  to cue pt to tuck chin, swallow 2x with chin down. Also have pt sit  upright 30 minutes after meal. Pt was able to follow instructions for  strategies during MBS with a visual cue- hopeful that this will continue  at bedside as well but decreased cognitive status continues to put pt at  increased risk of aspiration. SLP will continue to follow closely for diet  tolerance/ consider advancement or repeat MBS as cognitive status  improves.  CHL IP TREATMENT RECOMMENDATION 12/04/2014  Treatment Recommendations Therapy as outlined in treatment plan below     CHL IP DIET RECOMMENDATION 12/04/2014  SLP Diet Recommendations Dysphagia 2 (Fine chop);Nectar  Liquid Administration via (None)  Medication Administration Crushed with puree  Compensations Slow rate;Small sips/bites;Multiple dry swallows after each  bite/sip;Clear throat intermittently;Chin tuck  Postural Changes and/or Swallow Maneuvers (None)     CHL IP OTHER RECOMMENDATIONS 12/04/2014  Recommended Consults (None)  Oral Care Recommendations Oral care BID  Other Recommendations Order thickener from pharmacy;Clarify dietary  restrictions     No flowsheet data found.   CHL IP FREQUENCY AND DURATION 12/04/2014  Speech Therapy Frequency (ACUTE ONLY) min 2x/week  Treatment Duration 2 weeks     Pertinent Vitals/Pain none    SLP Swallow Goals No flowsheet data found.  No flowsheet data found.    CHL IP REASON FOR REFERRAL 12/04/2014  Reason for Referral Objectively evaluate swallowing function     CHL IP ORAL PHASE  12/04/2014  Lips (None)  Tongue (None)  Mucous membranes (None)  Nutritional status (None)  Other (None)  Oxygen therapy (None)  Oral Phase Impaired  Oral - Pudding Teaspoon (None)  Oral - Pudding Cup (None)  Oral - Honey Teaspoon (None)  Oral - Honey Cup (None)  Oral - Honey Syringe (None)  Oral - Nectar Teaspoon (None)  Oral - Nectar Cup (None)  Oral - Nectar Straw (None)  Oral - Nectar Syringe (None)  Oral - Ice Chips (None)  Oral - Thin Teaspoon (None)  Oral - Thin Cup (None)  Oral - Thin Straw (None)  Oral - Thin Syringe (None)  Oral - Puree (None)  Oral - Mechanical Soft (None)  Oral - Regular (None)  Oral - Multi-consistency (None)  Oral - Pill (None)  Oral Phase - Comment (None)      CHL IP PHARYNGEAL PHASE 12/04/2014  Pharyngeal Phase Impaired  Pharyngeal - Pudding Teaspoon (None)  Penetration/Aspiration details (pudding teaspoon) (None)  Pharyngeal - Pudding Cup (None)  Penetration/Aspiration details (pudding cup) (None)  Pharyngeal - Honey Teaspoon (None)  Penetration/Aspiration details (honey teaspoon) (None)  Pharyngeal - Honey Cup (None)  Penetration/Aspiration details (honey cup) (None)  Pharyngeal - Honey Syringe (None)  Penetration/Aspiration details (honey syringe) (None)  Pharyngeal - Nectar Teaspoon (None)  Penetration/Aspiration details (nectar teaspoon) (None)  Pharyngeal - Nectar Cup (None)  Penetration/Aspiration details (nectar cup) (None)  Pharyngeal - Nectar Straw (None)  Penetration/Aspiration details (nectar straw) (None)  Pharyngeal - Nectar Syringe (None)  Penetration/Aspiration details (nectar syringe) (None)  Pharyngeal - Ice Chips (None)  Penetration/Aspiration details (ice chips) (None)  Pharyngeal - Thin Teaspoon (None)  Penetration/Aspiration details (thin teaspoon) (None)  Pharyngeal - Thin Cup (None)  Penetration/Aspiration details (thin cup) (None)  Pharyngeal - Thin Straw (None)  Penetration/Aspiration details (thin straw) (None)  Pharyngeal - Thin Syringe (None)   Penetration/Aspiration details (thin syringe') (None)  Pharyngeal - Puree (None)  Penetration/Aspiration details (puree) (None)  Pharyngeal - Mechanical Soft (None)  Penetration/Aspiration details (mechanical soft) (None)  Pharyngeal - Regular (None)  Penetration/Aspiration details (regular) (None)  Pharyngeal - Multi-consistency (None)  Penetration/Aspiration details (multi-consistency) (None)  Pharyngeal - Pill (None)  Penetration/Aspiration details (pill) (None)  Pharyngeal Comment (None)      CHL IP CERVICAL ESOPHAGEAL PHASE 12/04/2014  Cervical Esophageal Phase Impaired  Pudding Teaspoon (None)  Pudding Cup (None)  Honey Teaspoon (None)  Honey Cup (None)  Honey Straw (None)  Nectar Teaspoon Reduced cricopharyngeal relaxation;Esophageal backflow  into cervical esophagus  Nectar Cup Reduced cricopharyngeal relaxation;Esophageal backflow into  cervical esophagus  Nectar Straw (None)  Nectar Sippy Cup (None)  Thin Teaspoon (None)  Thin Cup Reduced cricopharyngeal relaxation;Esophageal backflow into  cervical esophagus  Thin Straw (None)  Thin Sippy Cup (None)  Cervical Esophageal Comment (None)    No flowsheet data found.         Kern Reap, MA, CCC-SLP 12/04/2014, 1:55 PM [id number]    Thurnell Lose, MD  [facility] Pager:[phone]  If 7PM-7AM, please contact night-coverage [email or web address] Password [email or web address] 12/18/2014, 1:20 PM   LOS: 16 days

## 2014-12-18 NOTE — Progress Notes (Signed)
Daily Progress Note   Patient Name: Logan May       Date: 12/18/2014 DOB: [birth year]  Age: 69 y.o. MRN#: [id number] Attending Physician: Thurnell Lose, MD Primary Care Physician: Redge Gainer, MD Admit Date: 12/02/2014  Reason for Consultation/Follow-up: Establishing goals of care, Inpatient hospice referral, Non pain symptom management and Terminal care  Subjective: Pt is a 70 yo man admitted with respiratory failure. His breathing is more labored this am. His secretions have worsened. He is more confused. Dr. Candiss Norse at bedside and encouraged wife to pursue hospice care in light of worsening aspiration and clinical status. Wife and sister [place] are tearful but also see significant change and agreeable to in-patient hospice  Interval Events: Now comfort care No transfer to SNF for rehab Length of Stay: 16 days  Current Medications: Scheduled Meds:  . cefTRIAXone (ROCEPHIN)  IV  1 g Intravenous Q24H  . enoxaparin (LOVENOX) injection  40 mg Subcutaneous Q24H  . glycopyrrolate  0.2 mg Intravenous Q12H  . ipratropium-albuterol  3 mL Nebulization TID  . [START ON 12/19/2014] thiamine  100 mg Oral Daily    Continuous Infusions: . dextrose 5 % and 0.45% NaCl 10 mL/hr at 12/18/14 0914    PRN Meds: acetaminophen, albuterol, Gerhardt's butt cream, ipratropium-albuterol, LORazepam, metoprolol, morphine injection, ondansetron (ZOFRAN) IV, RESOURCE THICKENUP CLEAR, sodium chloride  Palliative Performance Scale: 30%     Vital Signs: BP 154/73 mmHg  Pulse 119  Temp(Src) 98.4 F (36.9 C) (Oral)  Resp 20  Ht 5\' 11"  (1.803 m)  Wt 81.7 kg (180 lb 1.9 oz)  BMI 25.13 kg/m2  SpO2 95% SpO2: SpO2: 95 % O2 Device: O2 Device: Nasal Cannula O2 Flow Rate: O2 Flow Rate (L/min): 1 L/min  Intake/output summary:  Intake/Output Summary (Last 24 hours) at 12/18/14 0954 Last data filed at 12/18/14 0825  Gross per 24 hour  Intake   2220 ml  Output   1950 ml  Net    270 ml   LBM:     Baseline Weight: Weight: 81.647 kg (180 lb) Most recent weight: Weight: 81.7 kg (180 lb 1.9 oz)  Physical Exam: General: Older man, alert, very confused Resp: Increased work of breathing and very congested. Poor cough Cardiac: Tachy Psych: More confused. Speech hard to understand              Additional Data Reviewed: Recent Labs     12/16/14  0420  12/17/14  0400  WBC  9.3  8.3  HGB  7.6*  7.7*  PLT  378  378  NA  141  138  BUN  6  5*  CREATININE  0.99  0.90     Problem List:  Patient Active Problem List   Diagnosis Date Noted  . Palliative care encounter   . Dysphagia   . At high risk for falls 12/04/2014  . Empyema of right pleural space   . Severe sepsis 12/03/2014  . Pressure ulcer 12/03/2014  . Pleural effusion   . HCAP (healthcare-associated pneumonia)   . Altered level of consciousness   . CAP (community acquired pneumonia) 12/02/2014  . Atrial fibrillation 12/02/2014  . Prolonged Q-T interval on ECG 12/02/2014  . Weakness 12/02/2014  . Status post amputation of toe of left foot 06/04/2013  . Charcot foot due to diabetes mellitus 06/04/2013  . Diabetes mellitus type 2, controlled, with complications 26/37/8588  . Hypertension 11/26/2012  . Vitamin D deficiency 11/26/2012  . History of prostate cancer 11/26/2012  .  Hyperlipidemia 11/26/2012  . Generalized anxiety disorder 11/26/2012     Palliative Care Assessment & Plan    Code Status:  DNR  Goals of Care:  Goals now comfort  Transfer to in-patient hospice  Continue antibiotics until transfer  Symptom Management:  Pain: Pt denies pain but will leave prn ms04 1-2 mg IV q2 prn  Dyspnea: Increased work of breathing noted this am. Start prn ms04 q2 prn  Secretions: Lasix IVP ordered by Dr. Candiss Norse. DC scopolamine as it may worsen delirium. Start scheduled robinul 0.2 q12. Postural drainage as tolerated  Anxiety/agitation: Calm this am.No mitts on or sitter. Ativan prn avaialbe  Palliative  Prophylaxis:  Dulcolax prn for constipation. Anticipate need for schedule dosing  Psycho-social/Spiritual:  Desire for further Chaplaincy support:no   Prognosis: < 2 weeks Discharge Planning: Hospice facility   Care plan was discussed with Dr. Candiss Norse  Thank you for allowing the Palliative Medicine Team to assist in the care of this patient.   Time In: 0830 Time Out: 0930 Total Time 60 min Prolonged Time Billed  no     Greater than 50%  of this time was spent counseling and coordinating care related to the above assessment and plan.   Dory Horn, NP  12/18/2014, 9:54 AM  Please contact Palliative Medicine Team phone at [phone] for questions and concerns.

## 2014-12-19 MED ORDER — METOPROLOL TARTRATE 25 MG PO TABS: 25.0000 mg | ORAL_TABLET | Freq: Two times a day (BID) | ORAL | Status: AC

## 2014-12-19 MED ORDER — GLYCOPYRROLATE 0.2 MG/ML IJ SOLN
0.2000 mg | INTRAMUSCULAR | Status: DC | PRN
  Filled 2014-12-19: qty 1

## 2014-12-19 MED ORDER — SCOPOLAMINE 1 MG/3DAYS TD PT72
1.0000 | MEDICATED_PATCH | TRANSDERMAL | Status: DC
  Administered 2014-12-19: 1.5 mg via TRANSDERMAL
  Filled 2014-12-19: qty 1

## 2014-12-19 MED ORDER — MORPHINE SULFATE 2 MG/ML IJ SOLN: 1.0000 mg | INTRAMUSCULAR | Status: DC

## 2014-12-19 MED ORDER — MORPHINE SULFATE (CONCENTRATE) 10 MG/0.5ML PO SOLN: 10.0000 mg | ORAL | Status: DC | PRN

## 2014-12-19 MED ORDER — HEPARIN SOD (PORK) LOCK FLUSH 100 UNIT/ML IV SOLN
250.0000 [IU] | INTRAVENOUS | Status: AC | PRN
  Administered 2014-12-19: 250 [IU]

## 2014-12-19 MED ORDER — GLYCOPYRROLATE 0.2 MG/ML IJ SOLN
0.1000 mg | Freq: Two times a day (BID) | INTRAMUSCULAR | Status: DC
  Administered 2014-12-19: 0.1 mg via INTRAVENOUS
  Filled 2014-12-19 (×2): qty 0.5

## 2014-12-19 NOTE — Discharge Instructions (Signed)
Follow with Primary MD Redge Gainer, MD as desired. Goal of care is comfort at residential hospice.   Activity: As tolerated with Full fall precautions use walker/cane & assistance as needed   Disposition residential hospice   Diet: Comfort feeds. Dysphagia 1 diet with nectar thick liquids, requires full feeding assistance and aspiration precautions.

## 2014-12-19 NOTE — Progress Notes (Signed)
Patient saturations were 78% via 3lpm [place], rr 34, hr 112, bbs rhonchi. RN made aware, pt was repositioned, 02 increased to 6lpm, oral suctioned pt, neb tx given, sats now 90%. Will continue to monitor.

## 2014-12-19 NOTE — Consult Note (Signed)
[facility] Liaison: Received request from [place] at 11:00 for family interest in [facility]. Per CSW patient needs to leave hospital today. Chart reviewed and approved by [facility] MD. Met with family to confirm interest and complete paper work for transfer. Discharge summary has been faxed. Spoke with CSW to request he arrange [facility] pickup for 3:30pm.   RN Please call report to [phone].  Thank you.  Erling Conte, [place]

## 2014-12-19 NOTE — Progress Notes (Signed)
Pt prepared for d/c to [facility]. RIJ central line left in place per order, hep locked. Skin intact except as most recently charted. Report called to receiving facility. Pt to be transported by ambulance service.

## 2014-12-29 ENCOUNTER — Ambulatory Visit: Payer: Medicare Other | Admitting: Family Medicine

## 2014-12-31 LAB — FUNGUS CULTURE W SMEAR
FUNGAL SMEAR: NONE SEEN
FUNGAL SMEAR: NONE SEEN
FUNGAL SMEAR: NONE SEEN

## 2015-01-17 LAB — AFB CULTURE WITH SMEAR (NOT AT ARMC)
ACID FAST SMEAR: NONE SEEN
ACID FAST SMEAR: NONE SEEN
Acid Fast Smear: NONE SEEN

## 2015-01-27 IMAGING — CR DG CHEST 2V
3 series · 3 of 3 positions shown · non-contrast
Comparison: None.

CLINICAL DATA: Preoperative evaluation.  Treated hypertension.
Previous right-sided rib injury.

CHEST - 2 VIEW

[view not recorded (1 of 3)]
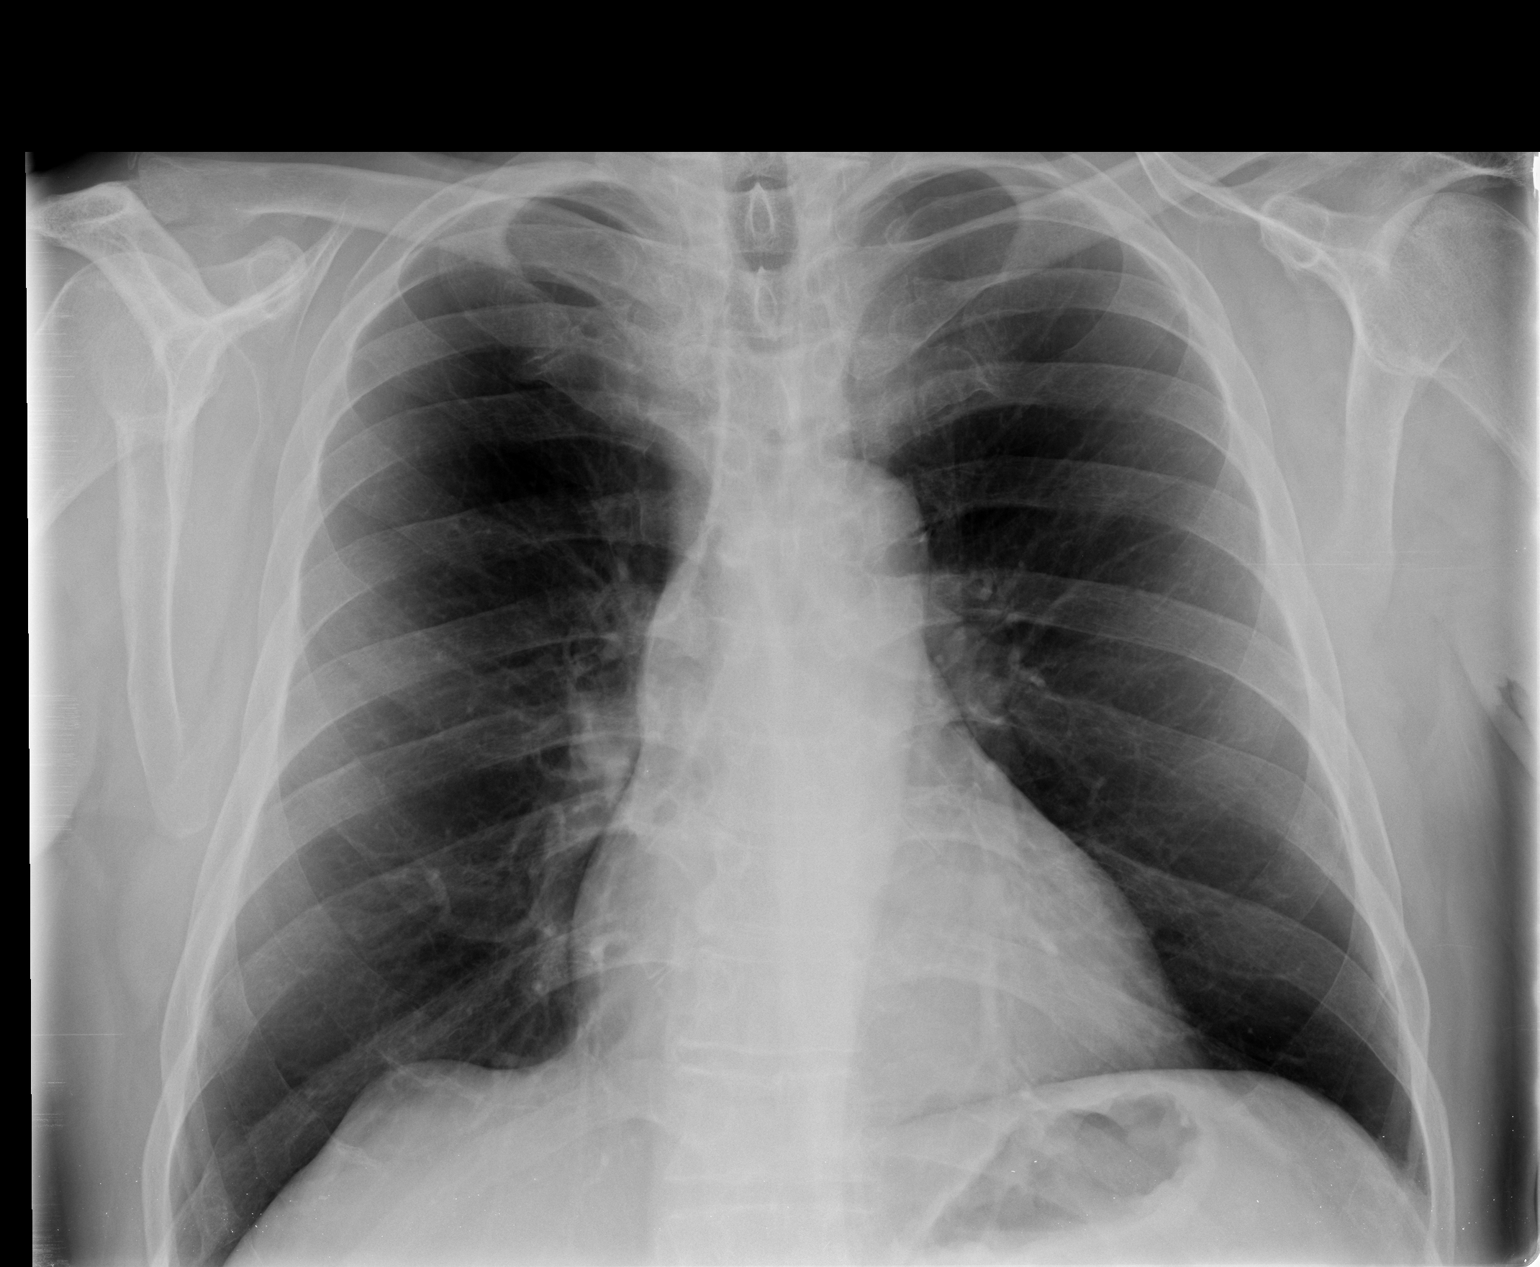

[view not recorded (2 of 3)]
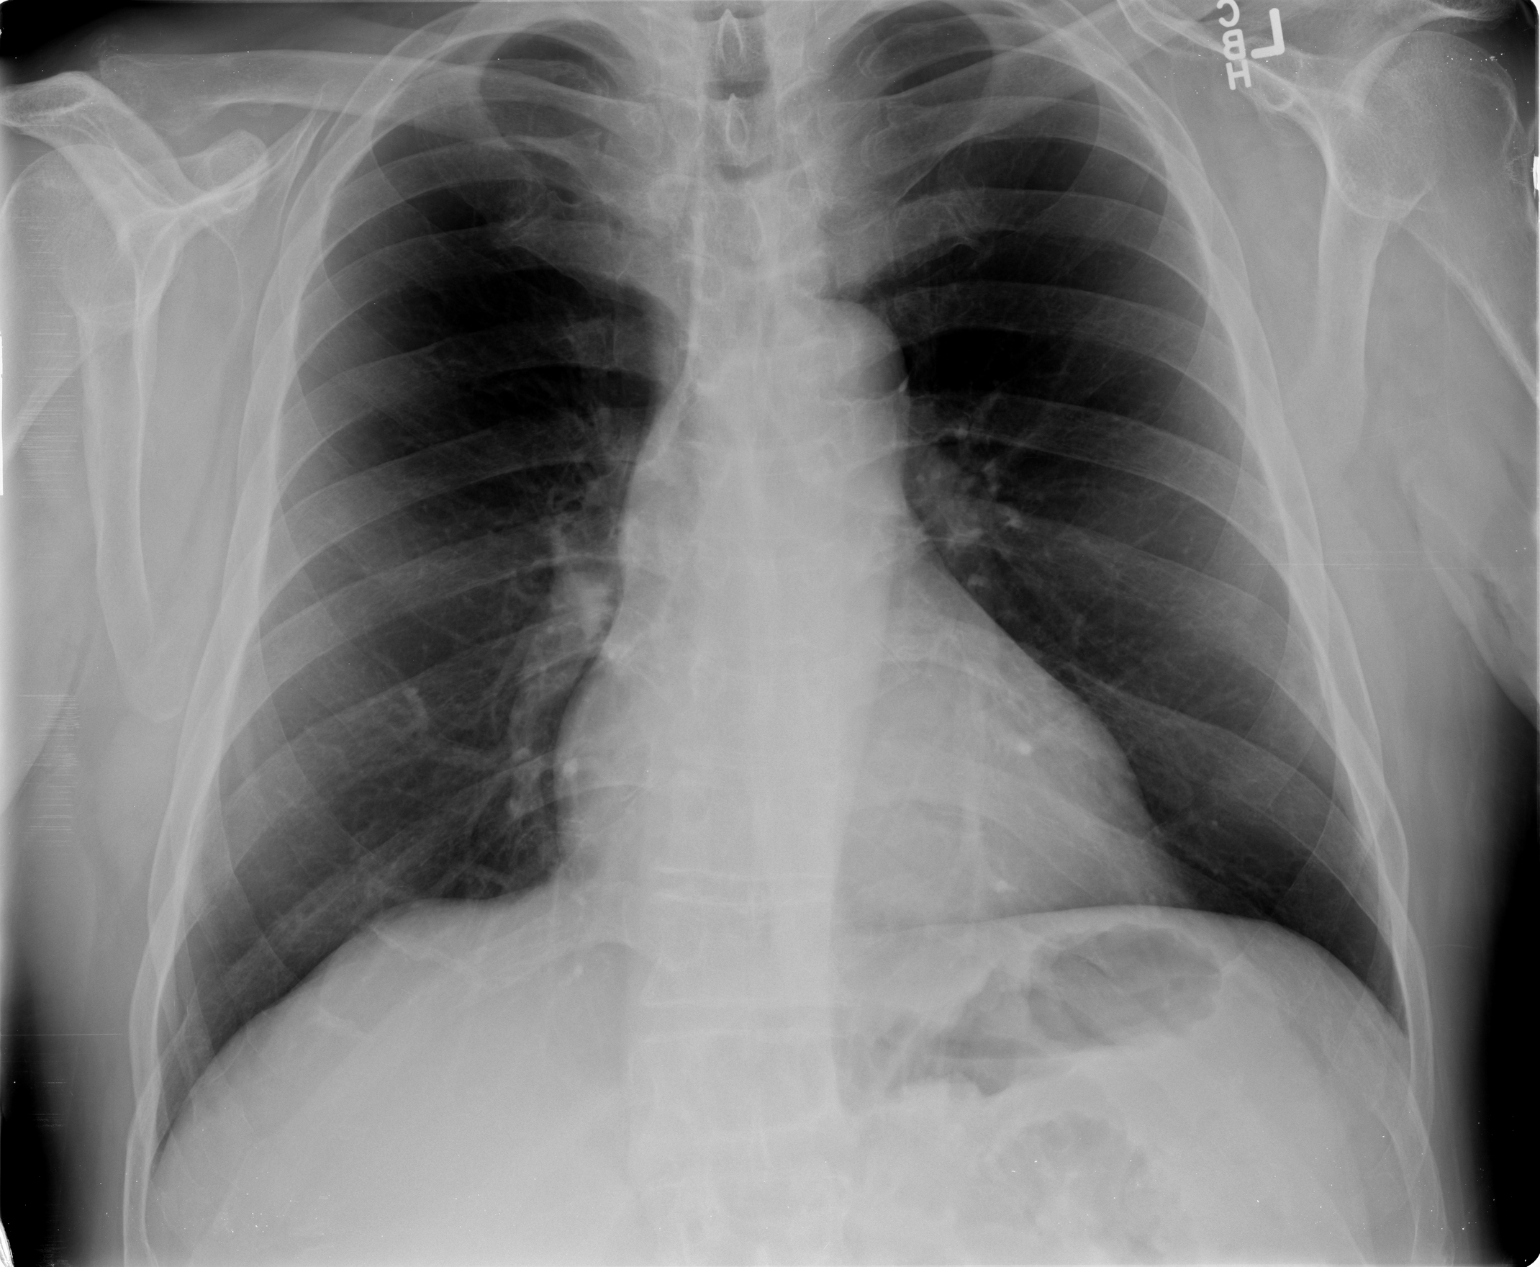

[view not recorded (3 of 3)]
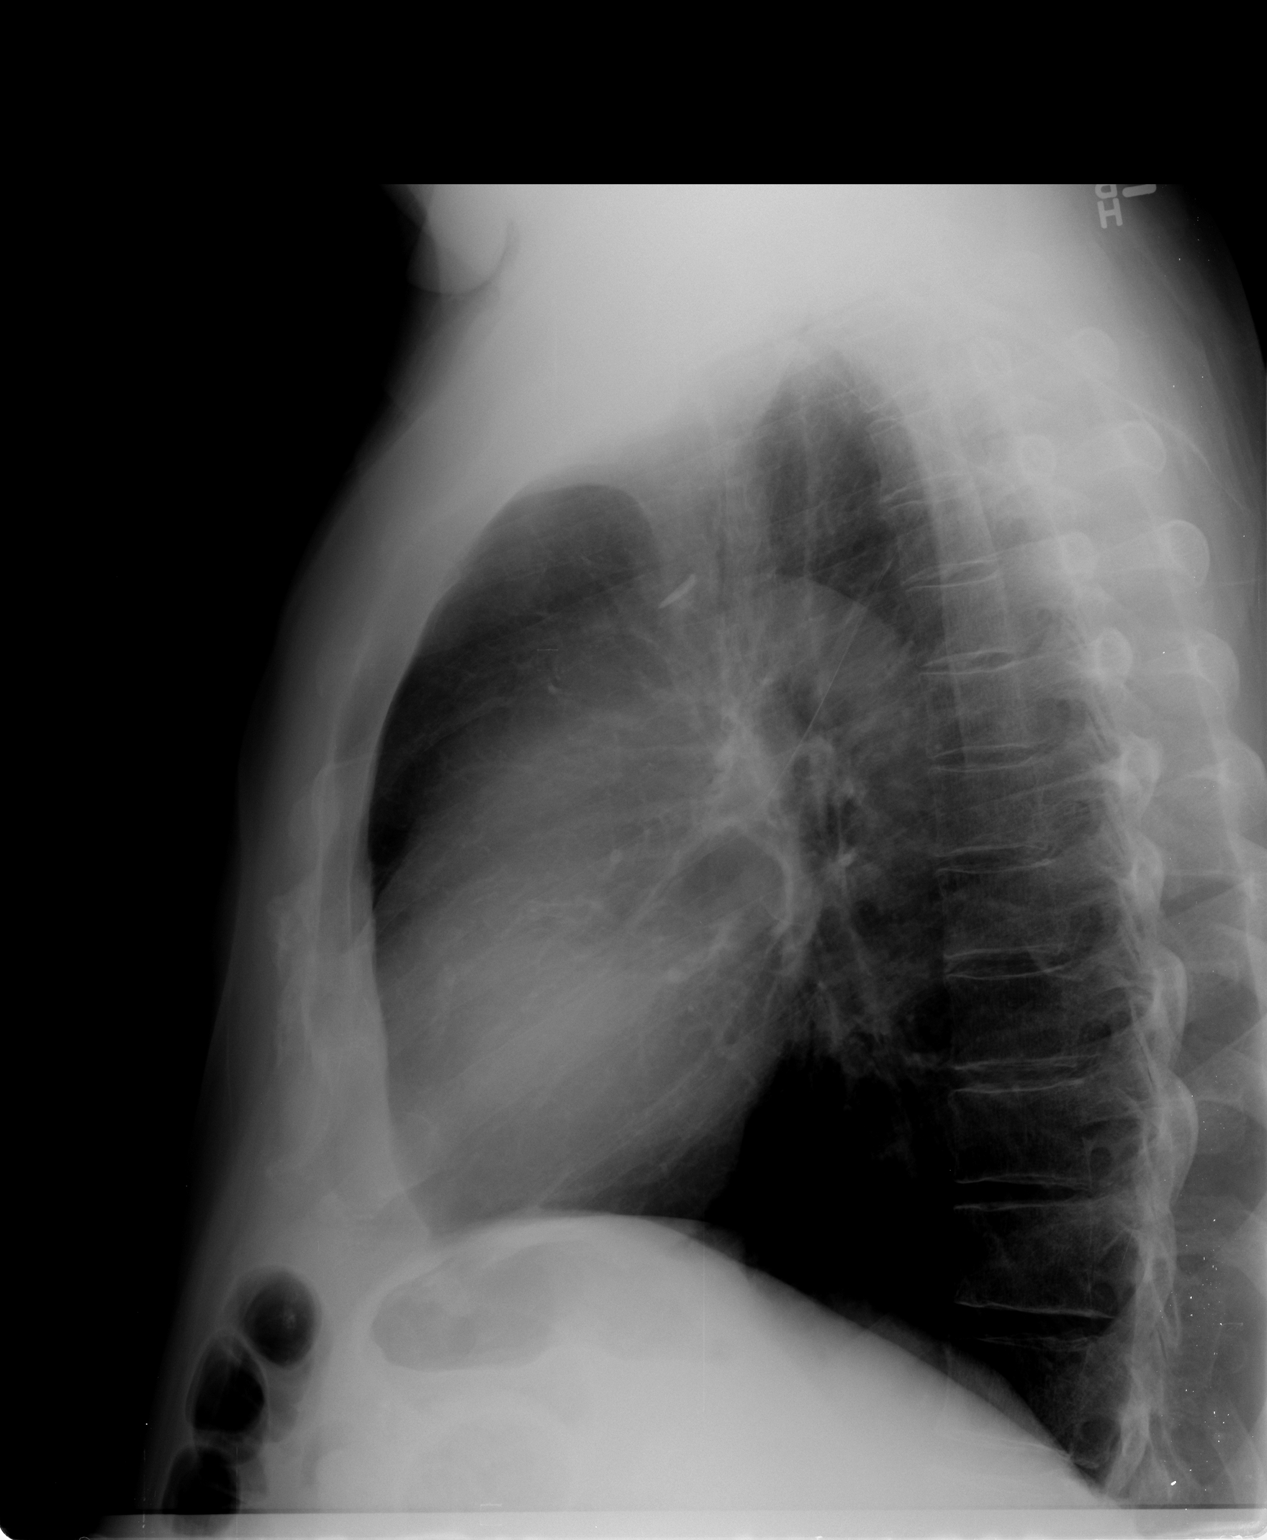

[3 of 3 positions shown; findings below may reference images not displayed]

FINDINGS: Cardiac silhouette is upper range of normal size.  There
is slight aortic ectasia.  No hilar enlargement is evident.  No
pulmonary infiltrates or masses are seen. No pleural abnormality is
evident. Bones appear average for age.
IMPRESSION: No acute or active cardiopulmonary pleural abnormalities are
identified.

## 2015-02-05 DEATH — deceased

## 2017-05-08 IMAGING — CT CT HEAD W/O CM
1 series · 16 of 30 positions shown, 20 images · non-contrast
Comparison: None.

CLINICAL DATA: Delirium secondary to alcohol withdrawal

EXAM:
CT HEAD WITHOUT CONTRAST
TECHNIQUE: Contiguous axial images were obtained from the base of the skull
through the vertex without intravenous contrast.

[Series 2: head 5.0 h30s · axial · 0.45mm/px · z∈[-133,+17]mm · 16 of 34 slices shown, 20 images]
[im 2/34  brain]
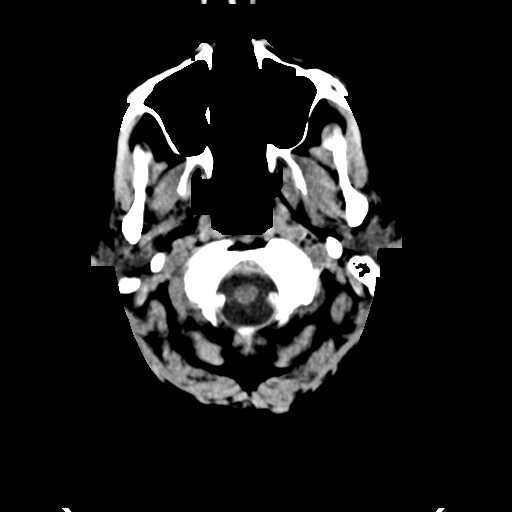
[im 2/34  bone]
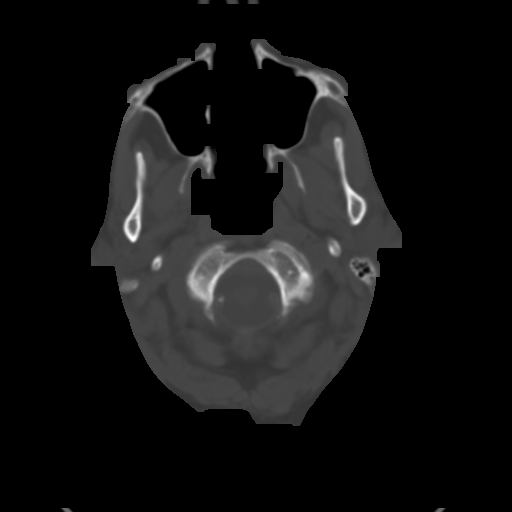
[im 4/34  brain]
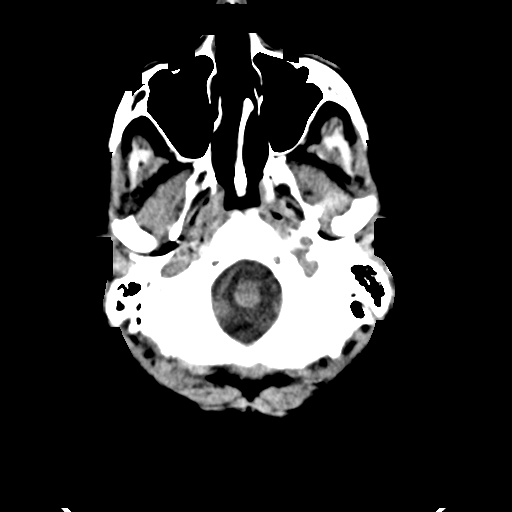
[im 6/34  brain]
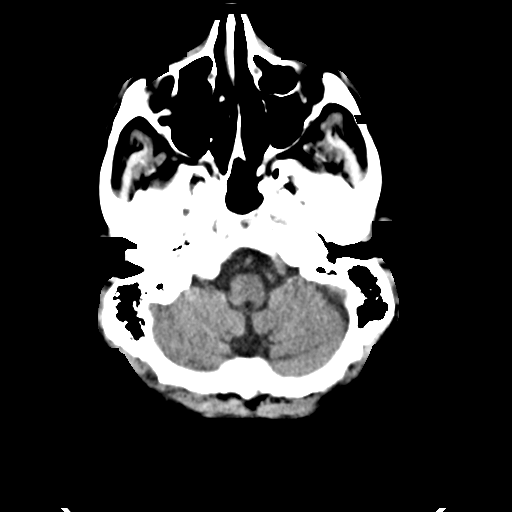
[im 8/34  brain]
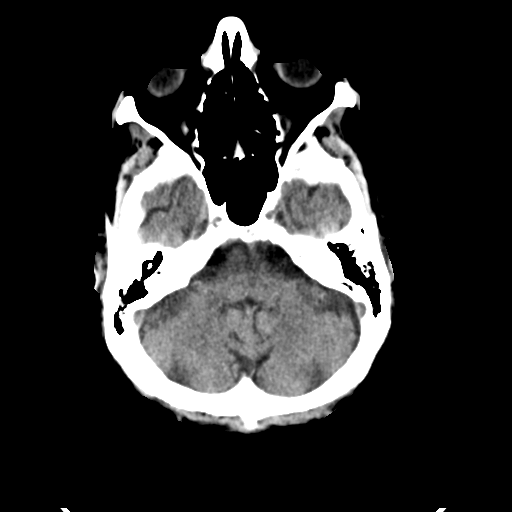
[im 10/34  brain]
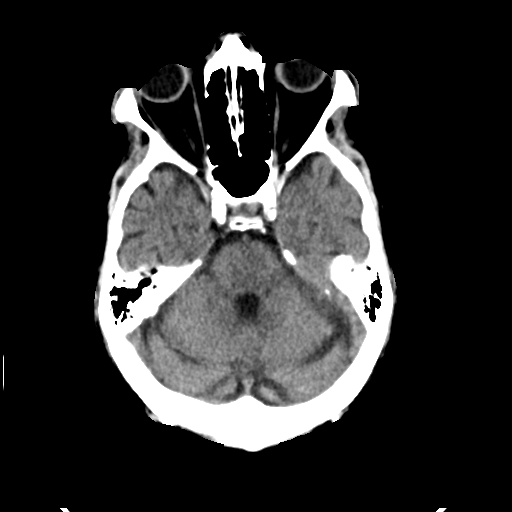
[im 10/34  bone]
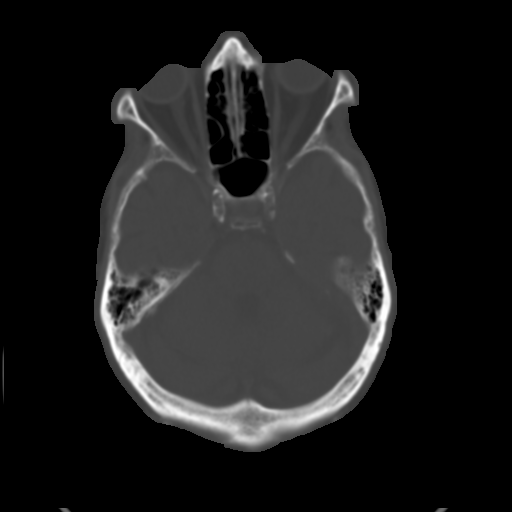
[im 12/34  brain]
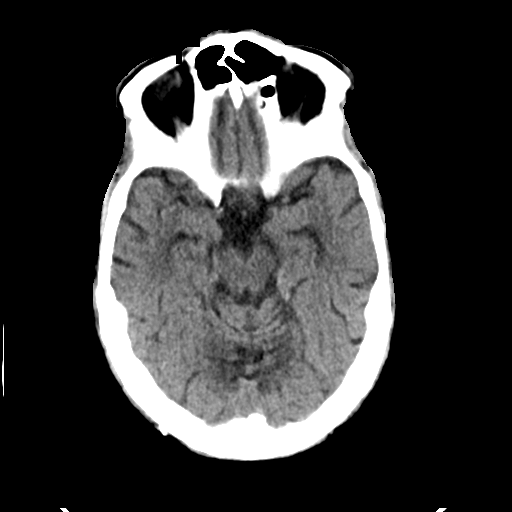
[im 14/34  brain]
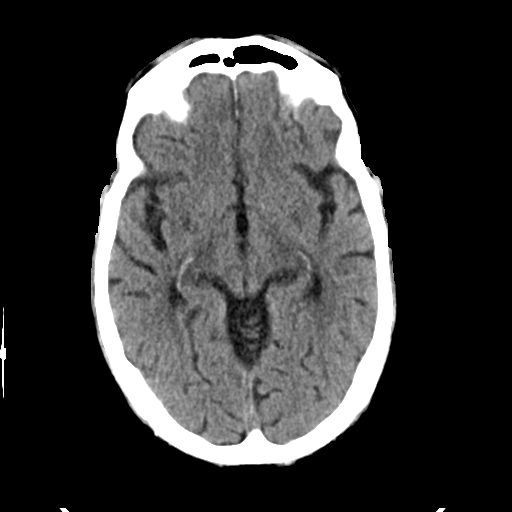
[im 16/34  brain]
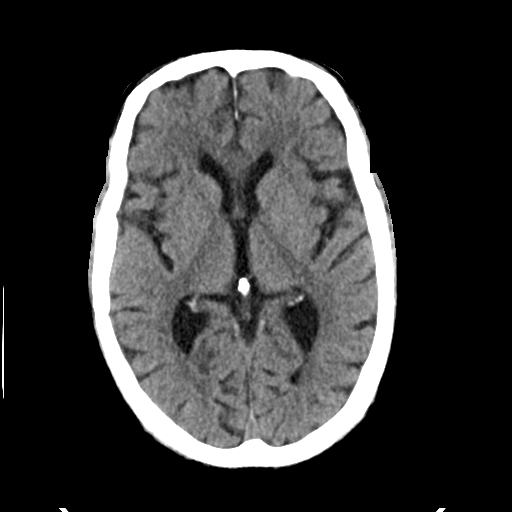
[im 18/34  brain]
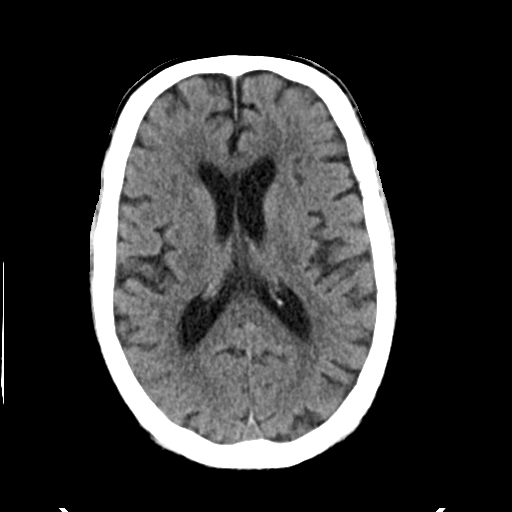
[im 18/34  bone]
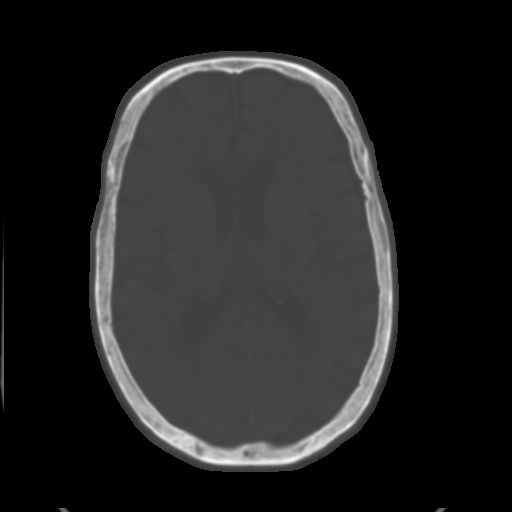
[im 20/34  brain]
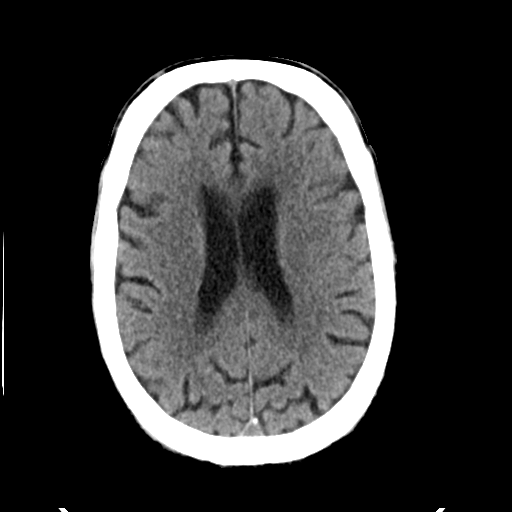
[im 22/34  brain]
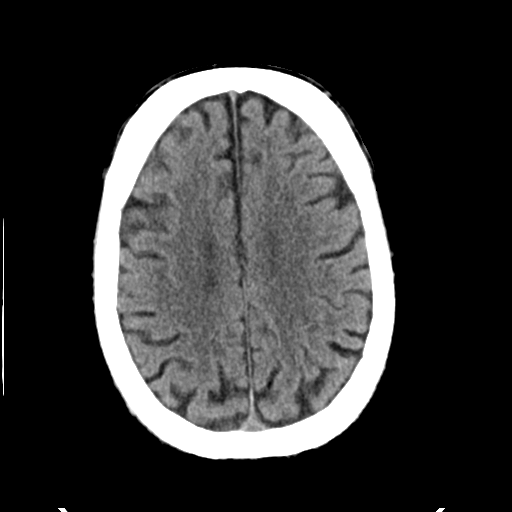
[im 24/34  brain]
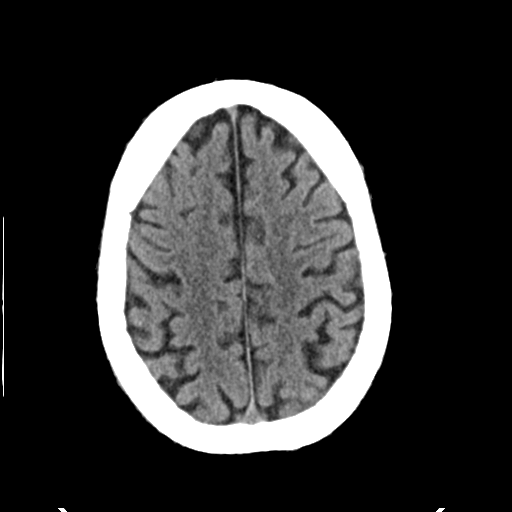
[im 26/34  brain]
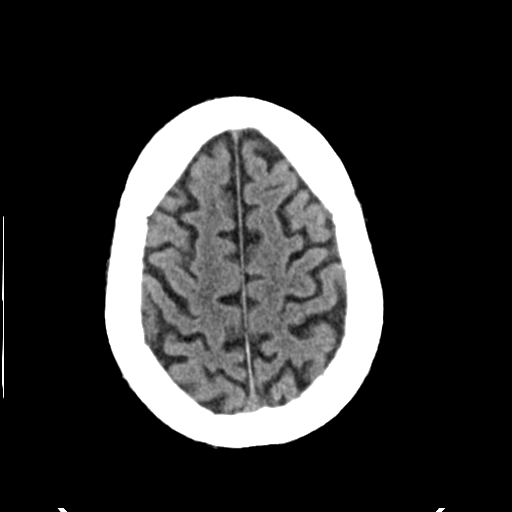
[im 26/34  bone]
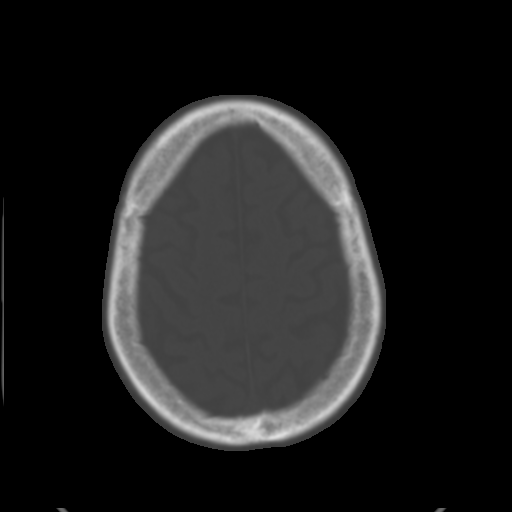
[im 28/34  brain]
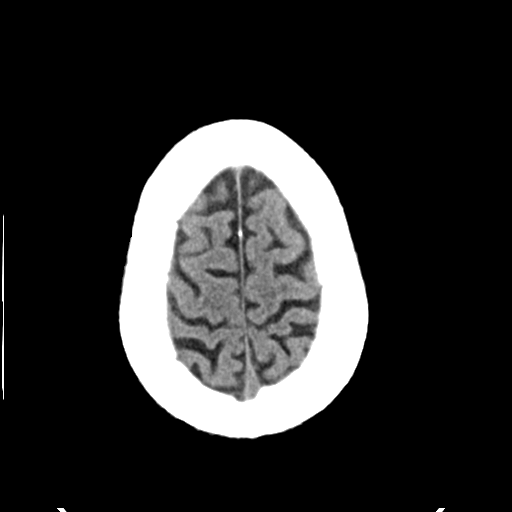
[im 30/34  brain]
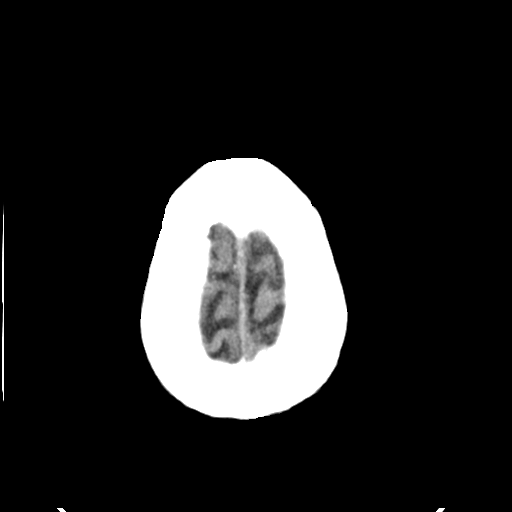
[im 32/34  brain]
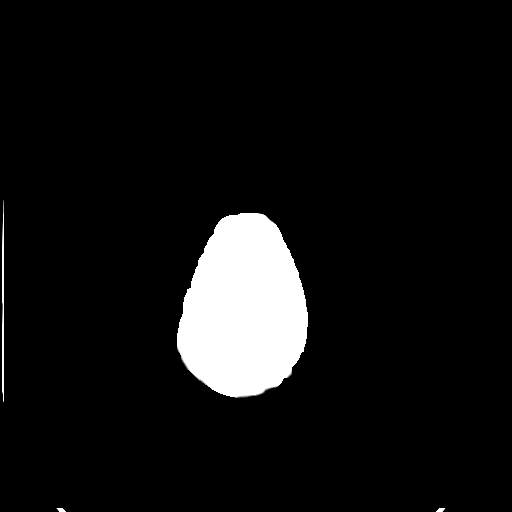

[16 of 30 positions shown; findings below may reference images not displayed]

FINDINGS: There is no evidence of mass effect, midline shift, or extra-axial
fluid collections. There is no evidence of a space-occupying lesion
or intracranial hemorrhage. There is no evidence of a cortical-based
area of acute infarction. There is generalized cerebral atrophy.
There is periventricular white matter low attenuation likely
secondary to microangiopathy.

The ventricles and sulci are appropriate for the patient's age. The
basal cisterns are patent.

Visualized portions of the orbits are unremarkable. The visualized
portions of the paranasal sinuses and mastoid air cells are
unremarkable. Cerebrovascular atherosclerotic calcifications are
noted.

The osseous structures are unremarkable.
IMPRESSION: No acute intracranial pathology.
